# Patient Record
Sex: Male | Born: 1980 | Race: White | Hispanic: No | Marital: Single | Smoking: Current every day smoker
Health system: Southern US, Community
[De-identification: ages and names within clinical notes are randomized; demographics above are authoritative.]

## PROBLEM LIST (undated history)

## (undated) DIAGNOSIS — F1120 Opioid dependence, uncomplicated: Secondary | ICD-10-CM

## (undated) DIAGNOSIS — L409 Psoriasis, unspecified: Secondary | ICD-10-CM

## (undated) DIAGNOSIS — L0291 Cutaneous abscess, unspecified: Secondary | ICD-10-CM

## (undated) DIAGNOSIS — K219 Gastro-esophageal reflux disease without esophagitis: Secondary | ICD-10-CM

## (undated) HISTORY — DX: Psoriasis, unspecified: L40.9

---

## 2003-06-29 ENCOUNTER — Emergency Department (HOSPITAL_COMMUNITY): Admission: EM | Admit: 2003-06-29 | Discharge: 2003-06-29 | Payer: Self-pay | Admitting: Emergency Medicine

## 2003-07-07 ENCOUNTER — Emergency Department (HOSPITAL_COMMUNITY): Admission: EM | Admit: 2003-07-07 | Discharge: 2003-07-07 | Payer: Self-pay | Admitting: Emergency Medicine

## 2005-06-05 ENCOUNTER — Emergency Department (HOSPITAL_COMMUNITY): Admission: EM | Admit: 2005-06-05 | Discharge: 2005-06-06 | Payer: Self-pay | Admitting: Emergency Medicine

## 2006-05-29 ENCOUNTER — Emergency Department (HOSPITAL_COMMUNITY): Admission: EM | Admit: 2006-05-29 | Discharge: 2006-05-29 | Payer: Self-pay | Admitting: Emergency Medicine

## 2006-06-08 ENCOUNTER — Emergency Department (HOSPITAL_COMMUNITY): Admission: EM | Admit: 2006-06-08 | Discharge: 2006-06-08 | Payer: Self-pay | Admitting: Emergency Medicine

## 2007-01-10 ENCOUNTER — Emergency Department (HOSPITAL_COMMUNITY): Admission: EM | Admit: 2007-01-10 | Discharge: 2007-01-10 | Payer: Self-pay | Admitting: Emergency Medicine

## 2007-04-27 ENCOUNTER — Emergency Department (HOSPITAL_COMMUNITY): Admission: EM | Admit: 2007-04-27 | Discharge: 2007-04-27 | Payer: Self-pay | Admitting: Emergency Medicine

## 2007-08-28 ENCOUNTER — Emergency Department (HOSPITAL_COMMUNITY): Admission: EM | Admit: 2007-08-28 | Discharge: 2007-08-28 | Payer: Self-pay | Admitting: Emergency Medicine

## 2007-09-02 ENCOUNTER — Emergency Department (HOSPITAL_COMMUNITY): Admission: EM | Admit: 2007-09-02 | Discharge: 2007-09-02 | Payer: Self-pay | Admitting: Family Medicine

## 2008-05-23 ENCOUNTER — Emergency Department (HOSPITAL_COMMUNITY): Admission: EM | Admit: 2008-05-23 | Discharge: 2008-05-23 | Payer: Self-pay | Admitting: Emergency Medicine

## 2008-08-15 ENCOUNTER — Emergency Department (HOSPITAL_COMMUNITY): Admission: EM | Admit: 2008-08-15 | Discharge: 2008-08-15 | Payer: Self-pay | Admitting: Emergency Medicine

## 2010-09-15 LAB — URINALYSIS, ROUTINE W REFLEX MICROSCOPIC
Ketones, ur: NEGATIVE mg/dL
Nitrite: NEGATIVE
Protein, ur: NEGATIVE mg/dL

## 2010-09-15 LAB — URINE CULTURE: Culture: NO GROWTH

## 2011-01-06 ENCOUNTER — Emergency Department (HOSPITAL_COMMUNITY)
Admission: EM | Admit: 2011-01-06 | Discharge: 2011-01-06 | Disposition: A | Discharge status: Home / Self Care | Payer: Self-pay | Attending: Emergency Medicine | Admitting: Emergency Medicine

## 2011-01-06 DIAGNOSIS — M545 Low back pain, unspecified: Secondary | ICD-10-CM | POA: Insufficient documentation

## 2011-02-27 LAB — CULTURE, ROUTINE-ABSCESS

## 2011-03-10 ENCOUNTER — Inpatient Hospital Stay (INDEPENDENT_AMBULATORY_CARE_PROVIDER_SITE_OTHER)
Admission: RE | Admit: 2011-03-10 | Discharge: 2011-03-10 | Disposition: A | Discharge status: Home / Self Care | Payer: Self-pay | Source: Ambulatory Visit | Attending: Family Medicine | Admitting: Family Medicine

## 2011-03-10 ENCOUNTER — Emergency Department (HOSPITAL_COMMUNITY)
Admission: EM | Admit: 2011-03-10 | Discharge: 2011-03-10 | Discharge status: Against Medical Advice | Payer: Self-pay | Attending: Emergency Medicine | Admitting: Emergency Medicine

## 2011-03-10 DIAGNOSIS — R443 Hallucinations, unspecified: Secondary | ICD-10-CM

## 2011-03-10 DIAGNOSIS — F22 Delusional disorders: Secondary | ICD-10-CM

## 2011-03-14 LAB — WOUND CULTURE

## 2011-03-20 LAB — CBC
MCV: 90.7
RBC: 4.99
RDW: 13.4

## 2011-03-20 LAB — BASIC METABOLIC PANEL
BUN: 2 — ABNORMAL LOW
CO2: 27
GFR calc Af Amer: >60

## 2011-03-20 LAB — RAPID URINE DRUG SCREEN, HOSP PERFORMED
Barbiturates: NOT DETECTED
Tetrahydrocannabinol: POSITIVE — AB

## 2011-03-20 LAB — DIFFERENTIAL
Eosinophils Absolute: 0.3
Lymphocytes Relative: 21
Lymphs Abs: 3
Monocytes Absolute: 0.8 — ABNORMAL HIGH
Neutrophils Relative %: 71

## 2011-03-20 LAB — ETHANOL: Alcohol, Ethyl (B): <5

## 2011-04-12 ENCOUNTER — Encounter: Payer: Self-pay | Admitting: Internal Medicine

## 2012-04-29 ENCOUNTER — Emergency Department (HOSPITAL_COMMUNITY)
Admission: EM | Admit: 2012-04-29 | Discharge: 2012-04-29 | Disposition: A | Discharge status: Home / Self Care | Payer: Self-pay | Attending: Emergency Medicine | Admitting: Emergency Medicine

## 2012-04-29 ENCOUNTER — Emergency Department (HOSPITAL_COMMUNITY): Payer: Self-pay

## 2012-04-29 ENCOUNTER — Encounter (HOSPITAL_COMMUNITY): Payer: Self-pay | Admitting: Emergency Medicine

## 2012-04-29 DIAGNOSIS — F17200 Nicotine dependence, unspecified, uncomplicated: Secondary | ICD-10-CM | POA: Insufficient documentation

## 2012-04-29 DIAGNOSIS — Y929 Unspecified place or not applicable: Secondary | ICD-10-CM | POA: Insufficient documentation

## 2012-04-29 DIAGNOSIS — X58XXXA Exposure to other specified factors, initial encounter: Secondary | ICD-10-CM | POA: Insufficient documentation

## 2012-04-29 DIAGNOSIS — IMO0002 Reserved for concepts with insufficient information to code with codable children: Secondary | ICD-10-CM | POA: Insufficient documentation

## 2012-04-29 DIAGNOSIS — S6991XA Unspecified injury of right wrist, hand and finger(s), initial encounter: Secondary | ICD-10-CM

## 2012-04-29 DIAGNOSIS — Y9389 Activity, other specified: Secondary | ICD-10-CM | POA: Insufficient documentation

## 2012-04-29 MED ORDER — HYDROCODONE-ACETAMINOPHEN 5-325 MG PO TABS: 1.0000 | ORAL_TABLET | ORAL | Status: DC | PRN

## 2012-04-29 MED ORDER — TETANUS-DIPHTH-ACELL PERTUSSIS 5-2.5-18.5 LF-MCG/0.5 IM SUSP
0.5000 mL | Freq: Once | INTRAMUSCULAR | Status: AC
  Administered 2012-04-29: 0.5 mL via INTRAMUSCULAR
  Filled 2012-04-29: qty 0.5

## 2012-04-29 MED ORDER — HYDROCODONE-ACETAMINOPHEN 5-325 MG PO TABS
2.0000 | ORAL_TABLET | Freq: Once | ORAL | Status: AC
  Administered 2012-04-29: 2 via ORAL
  Filled 2012-04-29: qty 2

## 2012-04-29 MED ORDER — CEPHALEXIN 500 MG PO CAPS: 500.0000 mg | ORAL_CAPSULE | Freq: Four times a day (QID) | ORAL | Status: DC

## 2012-04-29 MED ORDER — IBUPROFEN 800 MG PO TABS: 800.0000 mg | ORAL_TABLET | Freq: Three times a day (TID) | ORAL | Status: DC

## 2012-04-29 MED ORDER — AMOXICILLIN-POT CLAVULANATE 875-125 MG PO TABS
1.0000 | ORAL_TABLET | Freq: Once | ORAL | Status: AC
  Administered 2012-04-29: 1 via ORAL
  Filled 2012-04-29: qty 1
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
## 2012-04-29 NOTE — ED Notes (Signed)
Ortho paged. 

## 2012-04-29 NOTE — Progress Notes (Signed)
Orthopedic Tech Progress Note Patient Details:  [name] Jan 06, 1981 [id number]  Ortho Devices Type of Ortho Device: Velcro wrist splint   [name] 04/29/2012, 6:10 AM
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
## 2012-06-04 ENCOUNTER — Encounter (HOSPITAL_COMMUNITY): Payer: Self-pay | Admitting: *Deleted

## 2012-06-04 ENCOUNTER — Emergency Department (HOSPITAL_COMMUNITY)
Admission: EM | Admit: 2012-06-04 | Discharge: 2012-06-04 | Disposition: A | Discharge status: Home / Self Care | Payer: Self-pay | Attending: Emergency Medicine | Admitting: Emergency Medicine

## 2012-06-04 DIAGNOSIS — F17200 Nicotine dependence, unspecified, uncomplicated: Secondary | ICD-10-CM | POA: Insufficient documentation

## 2012-06-04 DIAGNOSIS — L729 Follicular cyst of the skin and subcutaneous tissue, unspecified: Secondary | ICD-10-CM

## 2012-06-04 DIAGNOSIS — L723 Sebaceous cyst: Secondary | ICD-10-CM | POA: Insufficient documentation

## 2012-06-04 MED ORDER — SULFAMETHOXAZOLE-TRIMETHOPRIM 800-160 MG PO TABS: 2.0000 | ORAL_TABLET | Freq: Two times a day (BID) | ORAL | Status: DC

## 2012-06-04 MED ORDER — OXYCODONE-ACETAMINOPHEN 5-325 MG PO TABS: 1.0000 | ORAL_TABLET | Freq: Four times a day (QID) | ORAL | Status: DC | PRN

## 2012-06-04 NOTE — ED Notes (Signed)
Pt reports abscess to left forearm. Pt took razor blade to area, Broshears puss came out. Now pt reports area is just hard. Pain 10/10

## 2012-06-04 NOTE — ED Provider Notes (Signed)
History     CSN: [id number]  Arrival date & time 06/04/12  1711   First MD Initiated Contact with Patient 06/04/12 1805      Chief Complaint  Patient presents with  . Abscess    (Consider location/radiation/quality/duration/timing/severity/associated sxs/prior treatment) HPI Patient presents emergency Department with abscess and cellulitis to the left forearm near the elbow.  Patient states that he attempted to open the area with a razor blade 3 days ago.  He states that he drained some material, but seemed to reaccumulate and get worse over the last few days.  Patient denies IV drug use.  Patient denies fever, nausea, vomiting, dizziness, lethargy or weakness.  Patient states palpation makes the pain, worse History reviewed. No pertinent past medical history.  History reviewed. No pertinent past surgical history.  History reviewed. No pertinent family history.  History  Substance Use Topics  . Smoking status: Current Every Day Smoker -- 1.5 packs/day for 15 years  . Smokeless tobacco: Never Used  . Alcohol Use: Yes     Comment: occasionally      Review of Systems All other systems negative except as documented in the HPI. All pertinent positives and negatives as reviewed in the HPI.  Allergies  Review of patient's allergies indicates no known allergies.  Home Medications  No current outpatient prescriptions on file.  BP 151/73  Pulse 101  Temp 98.2 F (36.8 C)  Resp 16  SpO2 100%  Physical Exam  Constitutional: He is oriented to person, place, and time. He appears well-developed and well-nourished. No distress.  Pulmonary/Chest: Effort normal.  Musculoskeletal:       Arms: Neurological: He is alert and oriented to person, place, and time.  Skin: Skin is warm and dry.    ED Course  Procedures (including critical care time)   INCISION AND DRAINAGE Performed by: Carlyle Dolly Consent: Verbal consent obtained. Risks and benefits: risks, benefits  and alternatives were discussed Type: abscess  Body area: Proximal lateral forearm on the left  Anesthesia: local infiltration  Incision was made with a scalpel.  Local anesthetic: lidocaine 2% w epinephrine  Anesthetic total: 7 ml  Complexity: complex Blunt dissection to break up loculations  Drainage: Jellylike   Drainage amount: The area appears to be draining more thick jellylike material that is non-purulent.  Dr. Patria Mane also looked at the area and felt like this was a large cyst that had become secondarily infected.  There is a well-circumscribed area that is fairly large to the lateral proximal forearm that seems most consistent with a cyst like growth. Patient tolerance: Patient tolerated the procedure well with no immediate complications.     MDM          Carlyle Dolly, [name] 06/04/12 2349

## 2012-06-05 NOTE — ED Provider Notes (Signed)
Medical screening examination/treatment/procedure(s) were performed by non-physician practitioner and as supervising physician I was immediately available for consultation/collaboration.   Lyanne Co, MD 06/05/12 [phone]

## 2012-06-08 ENCOUNTER — Emergency Department (HOSPITAL_COMMUNITY)
Admission: EM | Admit: 2012-06-08 | Discharge: 2012-06-08 | Disposition: A | Discharge status: Home / Self Care | Payer: Self-pay | Attending: Emergency Medicine | Admitting: Emergency Medicine

## 2012-06-08 ENCOUNTER — Encounter (HOSPITAL_COMMUNITY): Payer: Self-pay | Admitting: Emergency Medicine

## 2012-06-08 ENCOUNTER — Other Ambulatory Visit: Payer: Self-pay | Admitting: Orthopedic Surgery

## 2012-06-08 DIAGNOSIS — IMO0001 Reserved for inherently not codable concepts without codable children: Secondary | ICD-10-CM | POA: Insufficient documentation

## 2012-06-08 DIAGNOSIS — L02414 Cutaneous abscess of left upper limb: Secondary | ICD-10-CM

## 2012-06-08 DIAGNOSIS — F17200 Nicotine dependence, unspecified, uncomplicated: Secondary | ICD-10-CM | POA: Insufficient documentation

## 2012-06-08 DIAGNOSIS — IMO0002 Reserved for concepts with insufficient information to code with codable children: Secondary | ICD-10-CM | POA: Insufficient documentation

## 2012-06-08 HISTORY — DX: Cutaneous abscess, unspecified: L02.91

## 2012-06-08 MED ORDER — OXYCODONE-ACETAMINOPHEN 5-500 MG PO TABS: 1.0000 | ORAL_TABLET | Freq: Four times a day (QID) | ORAL | Status: DC | PRN

## 2012-06-08 MED ORDER — OXYCODONE-ACETAMINOPHEN 5-325 MG PO TABS: 1.0000 | ORAL_TABLET | Freq: Four times a day (QID) | ORAL | Status: DC | PRN

## 2012-06-08 MED ORDER — ONDANSETRON 4 MG PO TBDP
8.0000 mg | ORAL_TABLET | Freq: Once | ORAL | Status: AC
  Administered 2012-06-08: 8 mg via ORAL
  Filled 2012-06-08: qty 2

## 2012-06-08 MED ORDER — HYDROMORPHONE HCL PF 2 MG/ML IJ SOLN
2.0000 mg | Freq: Once | INTRAMUSCULAR | Status: AC
  Administered 2012-06-08: 2 mg via INTRAMUSCULAR
  Filled 2012-06-08: qty 1

## 2012-06-08 MED ORDER — OXYCODONE-ACETAMINOPHEN 5-325 MG PO TABS: 1.0000 | ORAL_TABLET | ORAL | Status: AC | PRN

## 2012-06-08 NOTE — ED Provider Notes (Signed)
History     CSN: [id number]  Arrival date & time 06/08/12  0920   First MD Initiated Contact with Patient 06/08/12 [phone]      Chief Complaint  Patient presents with  . Abscess    (Consider location/radiation/quality/duration/timing/severity/associated sxs/prior treatment) HPI Comments: Patient presents with complaint of infection to left elbow. Patient was seen in emergency department on 12/31 and had the area drained. It was thought at that time to be a cyst. Patient was placed on Bactrim and Percocet. He states that he has been taking these. Patient denies fever. Pain is worse with palpation. He is fully able to move elbow. Patient states that he used to use IV drugs however he has not in the past several months, and he did not use that part of his arm. No nausea or vomiting. Onset gradual. Course is persistent. Nothing makes symptoms better or worse.  Patient is a 32 y.o. male presenting with abscess. The history is provided by the patient.  Abscess  Pertinent negatives include no fever and no vomiting.    Past Medical History  Diagnosis Date  . Abscess     History reviewed. No pertinent past surgical history.  No family history on file.  History  Substance Use Topics  . Smoking status: Current Every Day Smoker -- 1.5 packs/day for 15 years  . Smokeless tobacco: Never Used  . Alcohol Use: Yes     Comment: occasionally      Review of Systems  Constitutional: Negative for fever.  Gastrointestinal: Negative for nausea and vomiting.  Musculoskeletal: Positive for myalgias. Negative for arthralgias.  Skin: Negative for color change.       Positive for abscess  Hematological: Negative for adenopathy.    Allergies  Review of patient's allergies indicates no known allergies.  Home Medications   Current Outpatient Rx  Name  Route  Sig  Dispense  Refill  . OXYCODONE-ACETAMINOPHEN 5-325 MG PO TABS   Oral   Take 1 tablet by mouth every 6 (six) hours as needed for  pain.   15 tablet   0   . SULFAMETHOXAZOLE-TRIMETHOPRIM 800-160 MG PO TABS   Oral   Take 2 tablets by mouth 2 (two) times daily.   40 tablet   0     BP 106/94  Pulse 82  Temp 97.8 F (36.6 C) (Oral)  Resp 18  SpO2 95%  Physical Exam  Nursing note and vitals reviewed. Constitutional: He appears well-developed and well-nourished.  HENT:  Head: Normocephalic and atraumatic.  Eyes: Conjunctivae normal are normal.  Neck: Normal range of motion. Neck supple.  Cardiovascular: Normal pulses.   Musculoskeletal: He exhibits edema and tenderness.       Left elbow: He exhibits swelling. He exhibits normal range of motion and no effusion. tenderness found.       Arms:      Hard area of induration to the radial aspect of antecubital fossa. There is overlying redness. Area is very tender. Small amount of sanguinous drainage.  Neurological: He is alert. No sensory deficit.       Motor, sensation, and vascular distal to the injury is fully intact.   Skin: Skin is warm and dry.  Psychiatric: He has a normal mood and affect.    ED Course  Procedures (including critical care time)  Labs Reviewed - No data to display No results found.   1. Abscess of forearm, left     9:42 AM Patient seen and examined. Medications ordered. Will  await hand consult.   Vital signs reviewed and are as follows: Filed Vitals:   06/08/12 0929  BP: 106/94  Pulse: 82  Temp: 97.8 F (36.6 C)  Resp: 18     MDM  Dr. Amanda Pea has seen and discharged. He will follow-up in office at outpatient. Likely infected inclusion cyst.         Renne Crigler, PA 06/08/12 1130

## 2012-06-08 NOTE — Discharge Summary (Signed)
  Please see  DictatedHistory and Physical as patient was not admitted. [name],[name]

## 2012-06-08 NOTE — ED Notes (Addendum)
Pt c/o abscess to left elbow area onset about 5 days ago. Pt reports seen at [facility] long for same. Pt sent by Dr Amanda Pea for further eval. Pt reports Dr. Amanda Pea is to meet him here

## 2012-06-08 NOTE — Consult Note (Signed)
[name] NO.:  [id number]  MEDICAL RECORD NO.:  [id number]  LOCATION:  C29C                         FACILITY:  [facility]  PHYSICIAN:  Dionne Ano. [name], M.D.DATE OF BIRTH:  07-29-1980  DATE OF CONSULTATION: DATE OF DISCHARGE:  06/08/2012                                CONSULTATION   HISTORY OF PRESENT ILLNESS:  [name] is a 32 year old male.  I was asked to see him in regard to his upper extremity predicament.  He has a history of left lateral elbow predicament that has been tender and problematic form.  I have reviewed this with him in length and summarized this in details.  A week ago, he began having some swelling in the arm.  He subsequently was seen in the ER.  Prior to that time, he had lanced the area himself in hopes to rid him of this problem.  He did obtain some discharge from the area in question.  On questioning the patient, he did not notice a lump, mass, or obvious sebaceous or inclusion cyst formation prior to a week ago, but has had a sebaceous cyst removed from his neck.  At present time, he presents for my evaluation and treatment.  I have seen him acutely in the emergency room today.  He is on Bactrim.  He notes no fever, chills, nausea, or vomiting.  He is pleasant male.  ALLERGIES:  None.  MEDICINES:  Bactrim DS and oxycodone p.r.n. pain.  He and I have reviewed his past medical and surgical history.  SOCIAL HISTORY:  Reviewed as well and is documented in his chart.  PHYSICAL EXAMINATION:  GENERAL:  Pleasant male.  He is appropriate, alert and oriented x4, in no acute distress. VITAL SIGNS:  Stable. NECK AND BACK:  Nontender. CHEST:  Clear. ABDOMEN:  Nontender. EXTREMITIES: Lower extremity examination is benign.  Right upper extremity is neurovascularly intact.  Left upper extremity has two areas laterally just at the point distal to the origin of the ECRB that is a bit swollen.  There is no erythema.  There is no  cellulitic change or excessive tenderness, induration or warmth.  There is some hardness to the area.  The elbow joint itself is stable to ligamentous examination.  He has an intact motion, no evidence of obvious bony deformity.  He is neurovascularly intact in the left upper extremity with normal radial, median, and ulnar nerve function.  PROCEDURE:  We have gone ahead and performed a limited debridement, unroofed the eschar and then performed I and D.  This I and D of skin and subcutaneous tissue performed with curette, chisel tip knife blade. He tolerated this well.  There were no obvious purulent features.  I specifically was looking for sebaceous cyst versus inclusion cyst-type material and I did not get a lot out.  I feel this likely represents the fact that he is to decompress the majority of this himself and has a thick and swollen capsule versus the fact that this is all just walled itself off again.  At the present time, I did not see any deep infectious-type features.  IMPRESSION:  Cystic mass with localized cellulitis, improving, status  post I and D.  PLAN:  I have gone ahead and packed the wound.  I discussed him dressing changes.  We were going to have him begin home dressing changes with Dial soap and water washing followed by Neosporin application followed by gauze followed by Desma Paganini and I am going to have him return to see [place] in the office Wednesday.  We have initiated care today and we will follow the patient.  I have discussed with him issues and options.  I do feel that there is a high probability that he may reform a inclusion/epidermoid/sebaceous-type cyst and ultimately need this removed electively, but I would not do this at this moment.  I would allow his areas to heal, treat any supposed cellulitis that was previously present, although there is none today and follow him closely. Ultimately, if he needs a higher tear irrigation and debridement, we will move  accordingly with that avenue of care.     Dionne Ano. [name], M.D.     [facility]  D:  06/08/2012  T:  06/08/2012  Job:  [id number]

## 2012-06-09 NOTE — ED Provider Notes (Signed)
Medical screening examination/treatment/procedure(s) were performed by non-physician practitioner and as supervising physician I was immediately available for consultation/collaboration.  Derwood Kaplan, MD 06/09/12 1205

## 2014-04-27 ENCOUNTER — Emergency Department (HOSPITAL_COMMUNITY)
Admission: EM | Admit: 2014-04-27 | Discharge: 2014-04-29 | Disposition: A | Discharge status: Other Acute Inpatient Hospital | Payer: Self-pay | Attending: Emergency Medicine | Admitting: Emergency Medicine

## 2014-04-27 ENCOUNTER — Encounter (HOSPITAL_COMMUNITY): Payer: Self-pay | Admitting: *Deleted

## 2014-04-27 DIAGNOSIS — Z79899 Other long term (current) drug therapy: Secondary | ICD-10-CM | POA: Insufficient documentation

## 2014-04-27 DIAGNOSIS — F1910 Other psychoactive substance abuse, uncomplicated: Secondary | ICD-10-CM | POA: Diagnosis present

## 2014-04-27 DIAGNOSIS — F1310 Sedative, hypnotic or anxiolytic abuse, uncomplicated: Secondary | ICD-10-CM | POA: Insufficient documentation

## 2014-04-27 DIAGNOSIS — F1510 Other stimulant abuse, uncomplicated: Secondary | ICD-10-CM | POA: Insufficient documentation

## 2014-04-27 DIAGNOSIS — F22 Delusional disorders: Secondary | ICD-10-CM | POA: Insufficient documentation

## 2014-04-27 DIAGNOSIS — Z792 Long term (current) use of antibiotics: Secondary | ICD-10-CM | POA: Insufficient documentation

## 2014-04-27 DIAGNOSIS — Z720 Tobacco use: Secondary | ICD-10-CM | POA: Insufficient documentation

## 2014-04-27 DIAGNOSIS — Z872 Personal history of diseases of the skin and subcutaneous tissue: Secondary | ICD-10-CM | POA: Insufficient documentation

## 2014-04-27 DIAGNOSIS — F1210 Cannabis abuse, uncomplicated: Secondary | ICD-10-CM | POA: Insufficient documentation

## 2014-04-27 DIAGNOSIS — D72829 Elevated white blood cell count, unspecified: Secondary | ICD-10-CM | POA: Insufficient documentation

## 2014-04-27 HISTORY — DX: Opioid dependence, uncomplicated: F11.20

## 2014-04-27 LAB — CBC
HEMATOCRIT: 49.2 % (ref 39.0–52.0)
HEMOGLOBIN: 17.3 g/dL — AB (ref 13.0–17.0)
MCH: 31.9 pg (ref 26.0–34.0)
MCHC: 35.2 g/dL (ref 30.0–36.0)
MCV: 90.8 fL (ref 78.0–100.0)
Platelets: 259 10*3/uL (ref 150–400)
RBC: 5.42 MIL/uL (ref 4.22–5.81)
RDW: 13.9 % (ref 11.5–15.5)
WBC: 20.9 10*3/uL — ABNORMAL HIGH (ref 4.0–10.5)

## 2014-04-27 NOTE — ED Notes (Signed)
Pt unable to void at this time. 

## 2014-04-27 NOTE — ED Notes (Signed)
[facility].  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  IVC papers taken out by his father.  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  Pt reports taking methadone every morning.  Pt is calm and cooperative at this time.  Denies any pain

## 2014-04-28 ENCOUNTER — Encounter (HOSPITAL_COMMUNITY): Payer: Self-pay | Admitting: Registered Nurse

## 2014-04-28 ENCOUNTER — Emergency Department (HOSPITAL_COMMUNITY): Payer: Self-pay

## 2014-04-28 DIAGNOSIS — F29 Unspecified psychosis not due to a substance or known physiological condition: Secondary | ICD-10-CM

## 2014-04-28 DIAGNOSIS — F1910 Other psychoactive substance abuse, uncomplicated: Secondary | ICD-10-CM | POA: Diagnosis present

## 2014-04-28 DIAGNOSIS — F22 Delusional disorders: Secondary | ICD-10-CM | POA: Diagnosis present

## 2014-04-28 LAB — RAPID URINE DRUG SCREEN, HOSP PERFORMED
Amphetamines: POSITIVE — AB
Barbiturates: NOT DETECTED
Benzodiazepines: POSITIVE — AB
COCAINE: NOT DETECTED
OPIATES: NOT DETECTED
Tetrahydrocannabinol: POSITIVE — AB

## 2014-04-28 LAB — URINE MICROSCOPIC-ADD ON

## 2014-04-28 LAB — COMPREHENSIVE METABOLIC PANEL
ALBUMIN: 4.6 g/dL (ref 3.5–5.2)
ALT: 46 U/L (ref 0–53)
ANION GAP: 18 — AB (ref 5–15)
AST: 33 U/L (ref 0–37)
Alkaline Phosphatase: 122 U/L — ABNORMAL HIGH (ref 39–117)
BUN: 16 mg/dL (ref 6–23)
CALCIUM: 10.8 mg/dL — AB (ref 8.4–10.5)
CO2: 24 mEq/L (ref 19–32)
Chloride: 92 mEq/L — ABNORMAL LOW (ref 96–112)
Creatinine, Ser: 1.06 mg/dL (ref 0.50–1.35)
GFR calc non Af Amer: >90 mL/min (ref >90)
GLUCOSE: 133 mg/dL — AB (ref 70–99)
Potassium: 4.1 mEq/L (ref 3.7–5.3)
SODIUM: 134 meq/L — AB (ref 137–147)
TOTAL PROTEIN: 10.1 g/dL — AB (ref 6.0–8.3)
Total Bilirubin: 0.5 mg/dL (ref 0.3–1.2)

## 2014-04-28 LAB — URINALYSIS, ROUTINE W REFLEX MICROSCOPIC
GLUCOSE, UA: NEGATIVE mg/dL
Ketones, ur: 15 mg/dL — AB
Leukocytes, UA: NEGATIVE
Nitrite: NEGATIVE
PH: 5.5 (ref 5.0–8.0)
Protein, ur: 100 mg/dL — AB
SPECIFIC GRAVITY, URINE: 1.036 — AB (ref 1.005–1.030)
UROBILINOGEN UA: 1 mg/dL (ref 0.0–1.0)

## 2014-04-28 LAB — ACETAMINOPHEN LEVEL

## 2014-04-28 LAB — ETHANOL: Alcohol, Ethyl (B): <11 mg/dL (ref 0–11)

## 2014-04-28 LAB — SALICYLATE LEVEL

## 2014-04-28 MED ORDER — DICYCLOMINE HCL 20 MG PO TABS: 20.0000 mg | ORAL_TABLET | Freq: Four times a day (QID) | ORAL | Status: DC | PRN

## 2014-04-28 MED ORDER — ONDANSETRON HCL 4 MG PO TABS: 4.0000 mg | ORAL_TABLET | Freq: Three times a day (TID) | ORAL | Status: DC | PRN

## 2014-04-28 MED ORDER — METHOCARBAMOL 500 MG PO TABS: 500.0000 mg | ORAL_TABLET | Freq: Three times a day (TID) | ORAL | Status: DC | PRN

## 2014-04-28 MED ORDER — PERPHENAZINE 4 MG PO TABS
4.0000 mg | ORAL_TABLET | Freq: Two times a day (BID) | ORAL | Status: DC
  Administered 2014-04-28 – 2014-04-29 (×3): 4 mg via ORAL
  Filled 2014-04-28 (×5): qty 1

## 2014-04-28 MED ORDER — NICOTINE 21 MG/24HR TD PT24
21.0000 mg | MEDICATED_PATCH | Freq: Every day | TRANSDERMAL | Status: DC
  Administered 2014-04-28 – 2014-04-29 (×2): 21 mg via TRANSDERMAL
  Filled 2014-04-28 (×2): qty 1

## 2014-04-28 MED ORDER — HYDROXYZINE HCL 25 MG PO TABS: 25.0000 mg | ORAL_TABLET | Freq: Four times a day (QID) | ORAL | Status: DC | PRN

## 2014-04-28 MED ORDER — CLONIDINE HCL 0.1 MG PO TABS: 0.1000 mg | ORAL_TABLET | ORAL | Status: DC

## 2014-04-28 MED ORDER — CLONIDINE HCL 0.1 MG PO TABS: 0.1000 mg | ORAL_TABLET | Freq: Every day | ORAL | Status: DC

## 2014-04-28 MED ORDER — ACETAMINOPHEN 325 MG PO TABS: 650.0000 mg | ORAL_TABLET | ORAL | Status: DC | PRN

## 2014-04-28 MED ORDER — ONDANSETRON 4 MG PO TBDP: 4.0000 mg | ORAL_TABLET | Freq: Four times a day (QID) | ORAL | Status: DC | PRN

## 2014-04-28 MED ORDER — NAPROXEN 500 MG PO TABS: 500.0000 mg | ORAL_TABLET | Freq: Two times a day (BID) | ORAL | Status: DC | PRN

## 2014-04-28 MED ORDER — LORAZEPAM 1 MG PO TABS
1.0000 mg | ORAL_TABLET | Freq: Three times a day (TID) | ORAL | Status: DC | PRN
  Administered 2014-04-28: 1 mg via ORAL
  Filled 2014-04-28: qty 1

## 2014-04-28 MED ORDER — CLONIDINE HCL 0.1 MG PO TABS
0.1000 mg | ORAL_TABLET | Freq: Four times a day (QID) | ORAL | Status: DC
  Administered 2014-04-28 – 2014-04-29 (×5): 0.1 mg via ORAL
  Filled 2014-04-28 (×5): qty 1

## 2014-04-28 MED ORDER — LOPERAMIDE HCL 2 MG PO CAPS
2.0000 mg | ORAL_CAPSULE | ORAL | Status: DC | PRN
Start: 2014-04-28 — End: 2014-04-29
  Administered 2014-04-28: 2 mg via ORAL
  Filled 2014-04-28: qty 1

## 2014-04-28 NOTE — ED Notes (Addendum)
Call to EDP to address abnormal urine. Request to call back in 15 minutes. (Dr. Hyacinth MeekerMiller)

## 2014-04-28 NOTE — ED Notes (Signed)
Pt belongings bags (2) are behind the triage nursing station.
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
## 2014-04-28 NOTE — BH Assessment (Signed)
Inpt recommended. No [facility] beds available. Sent referrals to the following facilities that report potential bed availability. [facility]  [place]  [place]  [place]  [place]  Delani Kohli, [place] Triage Specialist 04/28/2014 4:07 AM
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
## 2014-04-28 NOTE — ED Notes (Addendum)
Patient continues to have loose associations and AVH. "I swear to you I hear these voices, its my mother in the lobby."  Requested an enema for "the blood they put in my rectum." Redirected to reality.  Compliant with scheduled medications and no prn's required.  Safety maintained.
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
## 2014-04-28 NOTE — ED Notes (Signed)
Patient transported to X-ray 

## 2014-04-28 NOTE — BH Assessment (Signed)
Relayed results of assessment to Donell SievertSpencer Simon, PA. Per Karleen HampshireSpencer, [place] pt meets inpt criteria. No [facility] beds, TTS to seek placement.   Informed Elpidio AnisShari Upstill, [name] of recommendations and she is in agreement.   Informed RN of plan.   Pt in rest room will inform later.   Clista BernhardtNancy Shatarra Wehling, [facility] Triage Specialist 04/28/2014 1:11 AM

## 2014-04-28 NOTE — BH Assessment (Signed)
Spoke with William AnisShari Upstill, [name] prior to assessing pt. Pt was brought in under IVC due to increasing paranoia. Labs pending but medically cleared per William BeamShari, [name].  Assessment completed.    [name], [facility] Triage Specialist 04/28/2014 1:00 AM

## 2014-04-28 NOTE — Progress Notes (Signed)
[facility] Cooperative with initial nursing assessment. Pt denied pain when assessed. However, he was delusional stated his mother was in the lobby he could hear hear her voice and then later stated that she was gone because he could no longer hear her voice. Took his medication as scheduled. Writer made attempt to talk to Dr. Hyacinth MeekerMiller in reference to pt's UTI diagnosis in terms of medication order but was unable to talk with him. Spoke to another doctor who said he will inform Dr. Hyacinth MeekerMiller of my call. No gestures of self injurious behavior to note at this time. Q 15 minutes checks maintained for safety without behavioral outburst to note at this time.

## 2014-04-28 NOTE — ED Notes (Signed)
[facility] to [facility].
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
## 2014-04-28 NOTE — ED Notes (Signed)
2 pt belonging bags placed in locker #28. 

## 2014-04-29 ENCOUNTER — Inpatient Hospital Stay (HOSPITAL_COMMUNITY)
Admission: AD | Admit: 2014-04-29 | Discharge: 2014-05-07 | DRG: 897 | Disposition: A | Discharge status: Home / Self Care | Payer: Federal, State, Local not specified - Other | Source: Intra-hospital | Attending: Psychiatry | Admitting: Psychiatry

## 2014-04-29 ENCOUNTER — Encounter (HOSPITAL_COMMUNITY): Payer: Self-pay | Admitting: *Deleted

## 2014-04-29 DIAGNOSIS — F17210 Nicotine dependence, cigarettes, uncomplicated: Secondary | ICD-10-CM | POA: Diagnosis present

## 2014-04-29 DIAGNOSIS — F29 Unspecified psychosis not due to a substance or known physiological condition: Secondary | ICD-10-CM | POA: Insufficient documentation

## 2014-04-29 DIAGNOSIS — F1123 Opioid dependence with withdrawal: Secondary | ICD-10-CM | POA: Diagnosis present

## 2014-04-29 DIAGNOSIS — F1220 Cannabis dependence, uncomplicated: Secondary | ICD-10-CM | POA: Diagnosis present

## 2014-04-29 DIAGNOSIS — F1320 Sedative, hypnotic or anxiolytic dependence, uncomplicated: Secondary | ICD-10-CM | POA: Diagnosis present

## 2014-04-29 DIAGNOSIS — F13259 Sedative, hypnotic or anxiolytic dependence with sedative, hypnotic or anxiolytic-induced psychotic disorder, unspecified: Secondary | ICD-10-CM | POA: Diagnosis present

## 2014-04-29 DIAGNOSIS — F12250 Cannabis dependence with psychotic disorder with delusions: Secondary | ICD-10-CM | POA: Diagnosis present

## 2014-04-29 DIAGNOSIS — F1590 Other stimulant use, unspecified, uncomplicated: Secondary | ICD-10-CM | POA: Diagnosis present

## 2014-04-29 DIAGNOSIS — F19251 Other psychoactive substance dependence with psychoactive substance-induced psychotic disorder with hallucinations: Secondary | ICD-10-CM

## 2014-04-29 DIAGNOSIS — F200 Paranoid schizophrenia: Secondary | ICD-10-CM

## 2014-04-29 DIAGNOSIS — F1120 Opioid dependence, uncomplicated: Secondary | ICD-10-CM | POA: Diagnosis present

## 2014-04-29 DIAGNOSIS — F22 Delusional disorders: Secondary | ICD-10-CM | POA: Diagnosis present

## 2014-04-29 DIAGNOSIS — F15250 Other stimulant dependence with stimulant-induced psychotic disorder with delusions: Secondary | ICD-10-CM | POA: Diagnosis present

## 2014-04-29 DIAGNOSIS — F19239 Other psychoactive substance dependence with withdrawal, unspecified: Secondary | ICD-10-CM | POA: Insufficient documentation

## 2014-04-29 LAB — URINE CULTURE
CULTURE: NO GROWTH
Colony Count: NO GROWTH

## 2014-04-29 MED ORDER — PERPHENAZINE 4 MG PO TABS
4.0000 mg | ORAL_TABLET | Freq: Two times a day (BID) | ORAL | Status: DC
  Administered 2014-04-29 – 2014-05-04 (×8): 4 mg via ORAL
  Filled 2014-04-29 (×14): qty 1

## 2014-04-29 MED ORDER — ZIPRASIDONE MESYLATE 20 MG IM SOLR
20.0000 mg | Freq: Once | INTRAMUSCULAR | Status: AC
  Administered 2014-04-29: 20 mg via INTRAMUSCULAR
  Filled 2014-04-29: qty 20

## 2014-04-29 MED ORDER — METHOCARBAMOL 500 MG PO TABS: 500.0000 mg | ORAL_TABLET | Freq: Three times a day (TID) | ORAL | Status: DC | PRN

## 2014-04-29 MED ORDER — CLONIDINE HCL 0.1 MG PO TABS
0.1000 mg | ORAL_TABLET | Freq: Four times a day (QID) | ORAL | Status: AC
  Administered 2014-04-29: 0.1 mg via ORAL
  Filled 2014-04-29 (×4): qty 1

## 2014-04-29 MED ORDER — DICYCLOMINE HCL 20 MG PO TABS: 20.0000 mg | ORAL_TABLET | Freq: Four times a day (QID) | ORAL | Status: AC | PRN

## 2014-04-29 MED ORDER — ALUM & MAG HYDROXIDE-SIMETH 200-200-20 MG/5ML PO SUSP: 30.0000 mL | ORAL | Status: DC | PRN

## 2014-04-29 MED ORDER — MAGNESIUM HYDROXIDE 400 MG/5ML PO SUSP: 30.0000 mL | Freq: Every day | ORAL | Status: DC | PRN

## 2014-04-29 MED ORDER — NAPROXEN 500 MG PO TABS
500.0000 mg | ORAL_TABLET | Freq: Two times a day (BID) | ORAL | Status: DC | PRN
Start: 2014-04-29 — End: 2014-04-30

## 2014-04-29 MED ORDER — CLONIDINE HCL 0.1 MG PO TABS
0.1000 mg | ORAL_TABLET | Freq: Every day | ORAL | Status: AC
  Filled 2014-04-29 (×2): qty 1

## 2014-04-29 MED ORDER — NICOTINE 21 MG/24HR TD PT24
21.0000 mg | MEDICATED_PATCH | Freq: Every day | TRANSDERMAL | Status: DC
  Administered 2014-04-30 – 2014-05-07 (×5): 21 mg via TRANSDERMAL
  Filled 2014-04-29 (×2): qty 1
  Filled 2014-04-29: qty 14
  Filled 2014-04-29 (×5): qty 1
  Filled 2014-04-29: qty 14
  Filled 2014-04-29 (×3): qty 1

## 2014-04-29 MED ORDER — CLONIDINE HCL 0.1 MG PO TABS
0.1000 mg | ORAL_TABLET | ORAL | Status: AC
  Administered 2014-04-30: 0.1 mg via ORAL
  Filled 2014-04-29 (×4): qty 1

## 2014-04-29 MED ORDER — HYDROXYZINE HCL 25 MG PO TABS
25.0000 mg | ORAL_TABLET | Freq: Four times a day (QID) | ORAL | Status: AC | PRN
  Administered 2014-04-30: 25 mg via ORAL
  Filled 2014-04-29 (×2): qty 1
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
## 2014-04-29 NOTE — Progress Notes (Signed)
Pt took his scheduled AM medications with increased verbal prompts. Stated "y'all don't know what y'all doing, I want my damn Methadone back". Pt encouraged to comply with current treatment regimen to promote safety with detox / withdrawals. Pt. reluctantly took his medications then. Pt threw his scheduled 1400 dose of Clonidine 0.1mg  at writer and med fell on the floor in his room. Pt's bed had to be moved from it's original place to find his medication; which he then agreed to take. [facility] pending [employer] pick up d/u IVC status. Pt in agreement with transfer order. Maintained on Q15 minutes checks for safety.

## 2014-04-29 NOTE — ED Provider Notes (Signed)
Patient has been agitated this morning, slamming the wall and furniture.  Patient was IVC'd yesterday.  He is having auditory hallucinations.  There is question of possible urinary tract infection, labs reviewed.  He has 3-6 Sanson blood cells, many bacteria.  Urine was sent for culture.  It is still pending.  At this time, we will hold on treatment until we have a clear cut bacteria to treat.  20 mg of Geodon ordered to help with his behavior  Olivia Mackielga M Amari Burnsworth, MD 04/29/14 [phone]

## 2014-04-29 NOTE — Progress Notes (Signed)
Pt alert, remains delusional / confused when assessed. [facility] as per order and pt was cooperative with procedure. Report given to ByngBeverly, Charity fundraiserN [facility]. Pt was Picked up by [employer] d/t IVC status. Denied pain, SI / HI at time of assessment prior to departure. VSS. No behavioral outburst to note at this time. All belongings from locker 36 given to pt and property sheet signed in agreement to items received.  Safety maintained on Q 15 minutes checks till d/c.

## 2014-04-29 NOTE — Progress Notes (Signed)
[facility].  Derrell Lollingoris Madilyn Cephas, MSW Social Worker [phone]

## 2014-04-29 NOTE — BH Assessment (Addendum)
[facility] by Assunta FoundShuvon Rankin, NP and Dr. Jannifer FranklinAkintayo. Room assignment 501-2. Nursing report # [phone].

## 2014-04-29 NOTE — Progress Notes (Signed)
The focus of this group is to help patients review their daily goal of treatment and discuss progress on daily workbooks. Pt attended the evening group session but was unable to respond on-topic to discussion prompts from the Writer. Pt instead spoke about how he was having a problem seeing demons and that he would figure out a way to take care of them on his own. Pt's only additional request from Nursing Staff this evening was to receive toothbrush/toothpaste, which were given to him following group. Pt's affect was flat.
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
## 2014-04-29 NOTE — ED Notes (Signed)
Patient agitated. Appears to be responding to internal stimuli. Banging on unit doors. Slamming furniture in his room. Dr and security notified.

## 2014-04-29 NOTE — ED Notes (Signed)
Patient alert. Appears confused. Inquired if Clinical research associatewriter or other staff asked him not to sleep.  Encouragement offered. Declines intervention for anxiety and/or sleep.   Q 15 safety checks continue.

## 2014-04-30 DIAGNOSIS — F200 Paranoid schizophrenia: Secondary | ICD-10-CM

## 2014-04-30 DIAGNOSIS — F1910 Other psychoactive substance abuse, uncomplicated: Secondary | ICD-10-CM

## 2014-04-30 DIAGNOSIS — F29 Unspecified psychosis not due to a substance or known physiological condition: Secondary | ICD-10-CM | POA: Insufficient documentation

## 2014-04-30 MED ORDER — METHOCARBAMOL 750 MG PO TABS
750.0000 mg | ORAL_TABLET | Freq: Three times a day (TID) | ORAL | Status: AC | PRN
  Filled 2014-04-30: qty 1

## 2014-04-30 MED ORDER — IBUPROFEN 600 MG PO TABS
600.0000 mg | ORAL_TABLET | Freq: Four times a day (QID) | ORAL | Status: DC | PRN
  Filled 2014-04-30: qty 1
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
## 2014-04-30 NOTE — [facility] Suicide Risk Assessment (Signed)
   Nursing information obtained from:  Patient Demographic factors:  Male, Caucasian, Unemployed Current Mental Status:  NA Loss Factors:  NA Historical Factors:  NA Risk Reduction Factors:  Living with another person, especially a relative Total Time spent with patient: 1 hour  CLINICAL FACTORS:   More than one psychiatric diagnosis Currently Psychotic Previous Psychiatric Diagnoses and Treatments  Psychiatric Specialty Exam: Physical Exam  ROS  Blood pressure 144/76, pulse 110, temperature 97.8 F (36.6 C), temperature source Oral, resp. rate 18.There is no height or weight on file to calculate BMI.  General Appearance: Bizarre, Disheveled and Guarded  Eye Contact::  Poor  Speech:  Slow  Volume:  Decreased  Mood:  Irritable  Affect:  Inappropriate  Thought Process:  Circumstantial, Disorganized, Irrelevant and Loose  Orientation:  Full (Time, Place, and Person)  Thought Content:  Delusions, Hallucinations: Auditory and Paranoid Ideation  Suicidal Thoughts:  Yes.  without intent/plan  Homicidal Thoughts:  Yes.  without intent/plan  Memory:  Immediate;   Poor Recent;   Poor Remote;   Poor  Judgement:  Impaired  Insight:  Lacking  Psychomotor Activity:  Decreased  Concentration:  Poor  Recall:  Poor  Fund of Knowledge:Poor  Language: Fair  Akathisia:  No  Handed:  Right  AIMS (if indicated):     Assets:  Housing  Sleep:  Number of Hours: 6.75   Musculoskeletal: Strength & Muscle Tone: within normal limits Gait & Station: normal Patient leans: N/A  COGNITIVE FEATURES THAT CONTRIBUTE TO RISK:  Closed-mindedness Loss of executive function Polarized thinking Thought constriction (tunnel vision)    SUICIDE RISK:   Moderate:  Frequent suicidal ideation with limited intensity, and duration, some specificity in terms of plans, no associated intent, good self-control, limited dysphoria/symptomatology, some risk factors present, and identifiable protective factors,  including available and accessible social support.  PLAN OF CARE:  I certify that inpatient services furnished can reasonably be expected to improve the patient's condition.  Laquentin Loudermilk T. 04/30/2014, 12:25 PM

## 2014-04-30 NOTE — Plan of Care (Signed)
Problem: Alteration in mood & ability to function due to Goal: STG-Patient will comply with prescribed medication regimen (Patient will comply with prescribed medication regimen)  Outcome: Progressing Client will take medications as prescribed, reporting any side effects. "I don't take that, I don't take the orange and Latimore pills" Medications reviewed by Clinical research associatewriter, client took medications.

## 2014-04-30 NOTE — Plan of Care (Signed)
Problem: Alteration in thought process Goal: LTG-Patient verbalizes understanding importance med regimen (Patient verbalizes understanding of importance of medication regimen and need to continue outpatient care.) Outcome: Not Progressing Patient is refusing medication. Encouragement and medication education provided to pt.

## 2014-04-30 NOTE — [facility] Group Notes (Signed)
[facility] Group Notes:  (Nursing/MHT/Case Management/Adjunct)  Date:  04/30/2014  Time:  0900  Type of Therapy:  Psychoeducational Skills  Participation Level:  Did Not Attend  Participation Quality:  Did not attend  Affect:  Did not attend  Cognitive:  Did not attend  Insight:  None  Engagement in Group:  Did not attend  Modes of Intervention:  Education and Support  Summary of Progress/Problems: Pt did not attend group and remained laying in bed glaring at the ceiling, refusing to participate.  Lendell CapriceGuthrie, Kaylina Cahue A 04/30/2014, 12:16 PM

## 2014-04-30 NOTE — Plan of Care (Signed)
Problem: Ineffective individual coping Goal: STG: Patient will remain free from self harm Outcome: Progressing Patient remains free from self harm. 15 minute checks completed per protocol for pt safety.

## 2014-04-30 NOTE — Progress Notes (Signed)
Patient ID: [name], male   DOB: 07/27/1980, 33 y.o.   MRN: [id number] D: Client in bed this evening, eyes focused up at ceiling, refused medications and BP. "No, this ain't the right pill.  A: Writer encouraged client to have BP taken, noted it was elevated explained to client that medication would help decrease it. Writer inquired about hearing voices or suicidal ideations. Staff will monitor q4315min for safety. R: Client took medication after reluctantly having BP taken. Client is safe on the unit.
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
## 2014-05-01 LAB — GLUCOSE, CAPILLARY: Glucose-Capillary: 110 mg/dL — ABNORMAL HIGH (ref 70–99)

## 2014-05-01 MED ORDER — OLANZAPINE 10 MG PO TBDP
10.0000 mg | ORAL_TABLET | Freq: Once | ORAL | Status: AC
  Administered 2014-05-01: 10 mg via ORAL
  Filled 2014-05-01 (×2): qty 1

## 2014-05-01 NOTE — Progress Notes (Signed)
Patient ID: [name], male   DOB: August 21, 1980, 33 y.o.   MRN: [id number] D: Client lying still in bed looking towards the ceiling, refusing food. A: Writer in to assess, ability to follow directions encouraging client to get up and give a urine sample. Client encouraged to interact with staff about current stillness and quietness. CBG taken to assess for hypoglycemia, as client had not eaten this shift and dinner still at bedside. R: Client refuses saying "I'm going to knock you to the gates of hell" "I'm un conjuring something" client finally gets up to the BR but unable to process what he is suppose to be doing with the cup. Client was internally preoccupied, talking to voices "[name]" "I'm not going to do it" "Why did you do that to me, bitch" Writer called [name]. oncall PA received order for antipsychotic Zydis 10mg , because client had refused several Trilafon during the twenty-four hour period and has not sleep, because he was delusional.
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
## 2014-05-01 NOTE — Progress Notes (Signed)
Patient refused 1700 medication. Pt states "I don't need meds, I'm not crazy, I'm demonized. I got demons in me, now leave me alone, go on."

## 2014-05-01 NOTE — Progress Notes (Signed)
Psychoeducational Group Note  Date:  05/01/2014 Time: 2035  Group Topic/Focus:  Wrap-Up Group:   The focus of this group is to help patients review their daily goal of treatment and discuss progress on daily workbooks.  Participation Level: Did Not Attend  Participation Quality:  Not Applicable  Affect:  Not Applicable  Cognitive:  Not Applicable  Insight:  Not Applicable  Engagement in Group: Not Applicable  Additional Comments:  The patient did not attend group this evening.   Hazle CocaGOODMAN, Zakaria Sedor S 05/01/2014, 8:36 PM

## 2014-05-01 NOTE — Plan of Care (Signed)
Problem: Ineffective individual coping Goal: STG: Patient will remain free from self harm Outcome: Progressing Client remain safe on the unit with q7715min checks, agreeing to take scheduled medications and reporting side effects.

## 2014-05-01 NOTE — Clinical Social Work Note (Signed)
Unable to conduct PSA due to extreme paranoia.

## 2014-05-01 NOTE — Plan of Care (Signed)
Problem: Alteration in mood & ability to function due to Goal: LTG-Pt verbalizes understanding of importance of med regimen (Patient verbalizes understanding of importance of medication regimen and need to continue outpatient care and support groups)  Outcome: Not Progressing Patient is not able to verbalize the importance of his medication regimen but is receiving medication education via RN and takes his medications with encouragement. Goal: STG-Patient will report withdrawal symptoms Outcome: Not Progressing Patient denies all withdrawal symptoms but appears to be experiencing some including increased BP at times, agitation/anxiety, and has visible sweat on face at times.  Problem: Alteration in thought process Goal: LTG-Patient behavior demonstrates decreased signs psychosis (Patient behavior demonstrates decreased signs of psychosis to the point the patient is safe to return home and continue treatment in an outpatient setting.)  Outcome: Not Progressing Goal: STG-Patient is able to sleep at least 6 hours per night Outcome: Not Progressing Patient did not sleep much last night, per night shift nurse, pt slept about 1 hour.
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
## 2014-05-01 NOTE — Progress Notes (Signed)
D: Patient is alert and oriented. Pt's mood and affect is irritable, angry, and verbally aggressive. Pt speech is pressured and pt has forced laugh with smile incongruent with circumstances. Pt eye contact is glaring and pensive and pt is preoccupied, blocking, and paranoid. Pt reports depression 10/10 and hopelessness and anxiety 0/10. Pt refused clonidine this am and reports he doesn't take "the Balis pill and the orange pill together." Pt denies SI/HI and AVH but remains in bed glaring at ceiling/walls the majority of the day. Pt states to RN "Do you think I'm stupid?" Pt has notable body odor. Pt took Trilafon dose with encouragement and threw water cup aggressively after administration. Pt complains of back pain 8/10 but refuses PRN medications for pain. A: Hygiene supplies given. Support/Ecouragement provided to pt. Medication education reviewed with pt. PRN medications for pain offered. Pt encouraged to get out of bed to reduce back pain. Pressure ulcer prevention education reviewed with pt. Scheduled medications administered per providers orders (See MAR). 15 minute checks completed per protocol for pt safety. R: Pt not receptive to nursing interventions.
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
## 2014-05-01 NOTE — [facility] Group Notes (Signed)
[facility]   05/01/2014 10:15 AM  Participation Quality:  Invited.  Chose to not attend    [name], Thereasa Distanceodney B

## 2014-05-02 MED ORDER — HYDROXYZINE HCL 25 MG PO TABS
ORAL_TABLET | ORAL | Status: AC
  Filled 2014-05-02: qty 2

## 2014-05-02 MED ORDER — HYDROXYZINE HCL 50 MG PO TABS
50.0000 mg | ORAL_TABLET | Freq: Every evening | ORAL | Status: DC | PRN
  Administered 2014-05-02: 50 mg via ORAL

## 2014-05-02 NOTE — Progress Notes (Signed)
Adult Psychoeducational Group Note  Date:  05/02/2014 Time:  9:44 PM  Group Topic/Focus:  Wrap-Up Group:   The focus of this group is to help patients review their daily goal of treatment and discuss progress on daily workbooks.  Participation Level:  Minimal  Participation Quality:  Appropriate  Affect:  Flat  Cognitive:  Lacking  Insight: Limited  Engagement in Group:  Limited  Modes of Intervention:  Socialization and Support  Additional Comments:  Patient attended and participated in group tonight. He reports having a great day in comparison to the other days. He felt happier today. He went for his meals, attended his group and went outside with the group to play basketball. He advised that today he wanted to start over with everyone. He spent more time out of his room socializing with others.  Lita MainsFrancis, Thom Ollinger [facility] 05/02/2014, 9:44 PM

## 2014-05-02 NOTE — Progress Notes (Signed)
Writer spoke with patient 1:1 and he reports having had a good day today and it was good to get outside today. He requested his night medications but writer informed him that it was too early and the fact that he had nothing scheduled for night. Writer inquired about his sleep and he reports sleeping a couple of hours. Patient was informed that NP on call would be notified and made aware of his request. Patient apologized for his behavior when he was first admitted and reported that he appreciates everything staff have been doing to help him. He denies si/hi/a/v hallucinations. Safety maintained on unit with 15 min checks.

## 2014-05-02 NOTE — Progress Notes (Signed)
Patient ID: [name], male   DOB: 1981/04/16, 33 y.o.   MRN: [id number] D. Patient presents with irritable, suspicious mood, affect congruent. Patient has been isolative to his room throughout most of shift. He is noted to be responding to internal stimuli, with disheveled appearance. When writer approached patient was very paranoid and guarded stating '' what are you doing in here trying to talk to me for? I know everyone is playing mind games, I shouldn't be here in this asylum, they are trying to poison me. You are a witch. I can tell you are trying to use that reverse psychology on me. '' Patient has been refusing his medications, and easily agitated. He is refusing meals as well. Pt is unable to tolerate a room mate at this time. A. Medications given as ordered, and patient did later accept medications when MD explained medications would be forced. Discussed patient progress with Dr. Lolly MustacheArfeen. R. Patient is safe. In no acute distress at this time. Will continue to monitor q 15 minutes for safety.
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
## 2014-05-02 NOTE — Plan of Care (Signed)
Problem: Alteration in mood & ability to function due to Goal: STG-Patient will comply with prescribed medication regimen (Patient will comply with prescribed medication regimen)  Outcome: Progressing Pt refused multiple medications on 05/01/14.

## 2014-05-02 NOTE — [facility] Group Notes (Signed)
[facility] Group Notes:  (Nursing/MHT/Case Management/Adjunct)  Date:  05/02/2014  Time:  12:26 PM  Type of Therapy:  Psychoeducational Skills  Participation Level:  Active  Participation Quality:  Appropriate  Affect:  Appropriate  Cognitive:  Appropriate  Insight:  Appropriate  Engagement in Group:  Engaged  Modes of Intervention:  Problem-solving  Summary of Progress/Problems: Pt attended healthy coping skills group.   Jacquelyne BalintForrest, Kailene Steinhart Shanta 05/02/2014, 12:26 PM

## 2014-05-02 NOTE — Progress Notes (Signed)
Patient ID: [name], male   DOB: 30-Nov-1980, 33 y.o.   MRN: [id number] Consulted with MHTs on [address] who report patient currently experiencing psychosis. Will attempt to meet with patient and complete PSA later in the day as patient did agree this morning to take one of the medications prescribed. Carney Bernatherine C Jessenia Filippone, LCSW

## 2014-05-02 NOTE — [facility] Group Notes (Addendum)
[facility] Group Notes:  (Nursing/MHT/Case Management/Adjunct)  Date:  05/02/2014  Time:  11:26 AM  Type of Therapy:  Psychoeducational Skills  Participation Level:  Active  Participation Quality:  Appropriate  Affect:  flat  Cognitive:  Appropriate  Insight:  limited  Engagement in Group:  guarded  Modes of Intervention:  Problem-solving  Summary of Progress/Problems: Pt attended patient self inventory group. Jacquelyne BalintForrest, Shalita Shanta 05/02/2014, 11:26 AM

## 2014-05-02 NOTE — [facility] Group Notes (Signed)
   [facility] LCSW Group Therapy Note  05/02/2014 11:15   Type of Therapy and Topic:  Group Therapy: Avoiding Self-Sabotaging and Enabling Behaviors  Participation Level:  Minimal  Description of Group:     Learn how to identify obstacles, self-sabotaging and enabling behaviors, what are they, why do we do them and what needs do these behaviors meet? Discuss unhealthy relationships and how to have positive healthy boundaries with those that sabotage and enable. Explore aspects of self-sabotage and enabling in yourself and how to limit these self-destructive behaviors in everyday life. A scaling question is used to help patient look at where they are now in their motivation to change, from 1 to 10 (lowest to highest motivation).  Therapeutic Goals: 1. Patient will identify one obstacle that relates to self-sabotage and enabling behaviors 2. Patient will identify one personal self-sabotaging or enabling behavior they did prior to admission 3. Patient able to establish a plan to change the above identified behavior they did prior to admission:  4. Patient will demonstrate ability to communicate their needs through discussion and/or role plays.   Summary of Patient Progress: The main focus of today's process group was to discuss what "self-sabotage" means and use Motivational Interviewing to discuss what benefits, negative or positive, were involved in a self-identified self-sabotaging behavior. We then talked about reasons the patient may want to change the behavior and her current desire to change. A scaling question was used to help patient look at where they are now in motivation for change, from 1 to 10 (lowest to highest motivation).  Maisie Fushomas arrived at midpoint of group and appeared to be attentive once group process was explained to him. Patient shared that his life has been "a repeat for a while now." Patient described negative cycle of isolation, noncompliance with medication, substance use  followed by negative consequences. Maisie Fushomas shared that he is 'very invested in change' although he was unable to identify self sabotaging behaviors he wants to change nor rate his motivation.    Therapeutic Modalities:   Cognitive Behavioral Therapy Person-Centered Therapy Motivational Interviewing   Carney Bernatherine C Aayan Haskew, LCSW

## 2014-05-03 MED ORDER — TRAZODONE HCL 50 MG PO TABS
50.0000 mg | ORAL_TABLET | Freq: Every evening | ORAL | Status: DC | PRN
  Filled 2014-05-03 (×7): qty 1

## 2014-05-03 NOTE — [facility] Group Notes (Signed)
[facility] Group Notes:  (Nursing/MHT/Case Management/Adjunct)  Date:  05/03/2014  Time:  9:13 AM  Type of Therapy: Psychoeducational Skills  Participation Level: Active  Participation Quality: Appropriate  Affect: Appropriate  Cognitive: Appropriate  Insight: Appropriate  Engagement in Group: Engaged  Modes of Intervention: Problem-solving  Summary of Progress/Problems: Pt attended patient self inventory group.  Diavian Furgason Shanta 05/03/2014, 9:13 AM 

## 2014-05-03 NOTE — [facility] Group Notes (Signed)
[facility] Group Notes:  (Nursing/MHT/Case Management/Adjunct)  Date:  05/03/2014  Time:  10:09 AM  Type of Therapy: Psychoeducational Skills  Participation Level: Active  Participation Quality: Appropriate  Affect: Appropriate  Cognitive: Appropriate  Insight: Appropriate  Engagement in Group: Engaged  Modes of Intervention: Problem-solving  Summary of Progress/Problems: Pt attended healthy coping skills group.  [name] 05/03/2014, 10:09 AM 

## 2014-05-03 NOTE — Plan of Care (Signed)
Problem: Ineffective individual coping Goal: STG: Patient will remain free from self harm Outcome: Progressing Patient has remained free from harm.     

## 2014-05-03 NOTE — Progress Notes (Signed)
Patient ID: [name], male   DOB: [birth year], 33 y.o.   MRN: [id number] D. Pt presents with depressed mood, affect blunted. [name] continues to show improvement as he has been less isolative on the unit,and more interactive with staff and peers. He remains somewhat disorganized in his thoughts, but he denies any AH/VH today. He has been compliant with medications. [name] did complete his self inventory today and rates his depression and anxiety at 5/10 on scale, 10 being worst depression 1 being least. He did report very poor sleep last night. A. Medications given as ordered. Support and encouragement provided . Discussed above information with Dr. Lolly MustacheArfeen. R. Patient is safe. Will continue to monitor q 15 minutes for safety.

## 2014-05-03 NOTE — [facility] Group Notes (Signed)
[facility] LCSW Group Therapy  05/03/2014   11:00 AM   Type of Therapy:  Group Therapy  Participation Level:  Minimal  Participation Quality:  Appropriate and Attentive  Affect:  Appropriate, Flat and guarded  Cognitive:  Alert and Appropriate  Insight:  Developing/Improving and Engaged  Engagement in Therapy:  Developing/Improving and Engaged  Modes of Intervention:  Clarification, Confrontation, Discussion, Education, Exploration, Limit-setting, Orientation, Problem-solving, Rapport Building, Dance movement psychotherapisteality Testing, Socialization and Support  Summary of Progress/Problems: The main focus of today's process group was to identify the patient's current support system and decide on other supports that can be put in place.  An emphasis was placed on using counselor, doctor, therapy groups, 12-step groups, and problem-specific support groups to expand supports, as well as doing something different than has been done before.  Pt was quiet and minimally participated in group.  Pt was guarded when called upon but did share that he has been trying to come out of his room more and has enjoyed his time here so far.     William IvanChelsea Horton, LCSW 05/03/2014 12:03 PM

## 2014-05-03 NOTE — Progress Notes (Signed)
[facility]  05/03/2014 10:24 AM [name]  MRN:  [id number]   Subjective:  I am feeling better.   Objective Patient seen and chart reviewed.   Patient is taking his medication and feeling better.  He still have insomnia .  Patient is still very upset on his father and easily gets irritable paranoid and guarded about him.  He sleeping 3-4 hours .  He denies any side effects of medication.  Though he denies that he has any psychiatric illness but he is taking his medication as prescribed.  Patient continues to feel that his father is responsible for his admission but he was seen more pleasant in the groups.  He denies any hallucination or any paranoia but he continues to have vague homicidal thoughts towards his father.  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 Abscess   . Heroin addiction     ADL's:  Impaired  Sleep: Poor  Appetite:  Fair  Suicidal Ideation:  Plan:  Denies Intent:  Denies Means:  Denies Homicidal Ideation:  Plan:  Wants to hurt his father Intent:  Denies Means:  Denies AEB (as evidenced by):  Psychiatric Specialty Exam: Physical Exam  ROS  Blood pressure 121/95, pulse 117, temperature 98.4 F (36.9 C), temperature source Oral, resp. rate 20, SpO2 99 %.There is no height or weight on file to calculate BMI.  General Appearance: Disheveled and Guarded  Eye Contact::  Poor  Speech:  Slow  Volume:  Decreased  Mood:  Depressed, Dysphoric, Hopeless and Irritable  Affect:  Constricted and Depressed  Thought Process:  Circumstantial, Disorganized and Loose  Orientation:  Full (Time, Place, and Person)  Thought Content:  Paranoid Ideation  Suicidal Thoughts:  No  Homicidal Thoughts:   Yes.  without intent/plan  Memory:  Immediate;   Poor Recent;   Poor Remote;   Poor  Judgement:  Impaired  Insight:  Lacking  Psychomotor Activity:  Decreased  Concentration:  Fair  Recall:  [employer]  Fund of Knowledge:Poor  Language: Poor  Akathisia:  No  Handed:  Right  AIMS (if indicated):     Assets:  Housing  Sleep:  Number of Hours: 4.25   Musculoskeletal: Strength & Muscle Tone: within normal limits Gait & Station: normal Patient leans: N/A  Current Medications: Current Facility-Administered Medications  Medication Dose Route Frequency Provider Last Rate Last Dose  . alum & mag hydroxide-simeth (MAALOX/MYLANTA) 200-200-20 MG/5ML suspension 30 mL  30 mL Oral Q4H PRN Shuvon Rankin, NP      . cloNIDine (CATAPRES) tablet 0.1 mg  0.1 mg Oral QAC breakfast Shuvon Rankin, NP   0.1 mg at 05/03/14 [id number]  . dicyclomine (BENTYL) tablet 20 mg  20 mg Oral Q6H PRN Shuvon Rankin, NP      . hydrOXYzine (ATARAX/VISTARIL) tablet 25 mg  25 mg Oral Q6H PRN Shuvon Rankin, NP   25 mg at 04/30/14 0311  . hydrOXYzine (ATARAX/VISTARIL) tablet 50 mg  50 mg Oral QHS PRN Kristeen MansFran E Hobson, NP   50 mg at 05/02/14 2120  . ibuprofen (ADVIL,MOTRIN) tablet 600 mg  600 mg Oral Q6H PRN Lindwood QuaSheila May Agustin, NP      . magnesium hydroxide (MILK OF MAGNESIA) suspension 30 mL  30 mL  Oral Daily PRN Shuvon Rankin, NP      . methocarbamol (ROBAXIN) tablet 750 mg  750 mg Oral Q8H PRN Lindwood QuaSheila May Agustin, NP      . nicotine (NICODERM CQ - dosed in mg/24 hours) patch 21 mg  21 mg Transdermal Daily Shuvon Rankin, NP   21 mg at 05/03/14 0754  . perphenazine (TRILAFON) tablet 4 mg  4 mg Oral BID Shuvon Rankin, NP   4 mg at 05/03/14 [id number]    Lab Results:  No results found for this or any previous visit (from the past 48 hour(s)).  Physical Findings: AIMS: Facial and Oral Movements Muscles of Facial Expression: None, normal Lips and Perioral Area: None, normal Jaw: None, normal Tongue: None, normal,Extremity Movements Upper  (arms, wrists, hands, fingers): None, normal Lower (legs, knees, ankles, toes): None, normal, Trunk Movements Neck, shoulders, hips: None, normal, Overall Severity Severity of abnormal movements (highest score from questions above): None, normal Incapacitation due to abnormal movements: None, normal Patient's awareness of abnormal movements (rate only patient's report): No Awareness,    CIWA:    COWS:  COWS Total Score: 0  Treatment Plan Summary: Patient is showing some improvement from the past.  Continue Trilafon as prescribed.  Patient still have paranoia and homicidal thoughts towards his father.  Encouraged to participate in the groups.  Increase collateral from his father.  Patient will require appropriate and safe discharge planning.  He lives with his father .   Medical Decision Making Problem Points:  Review of last therapy session (1) and Review of psycho-social stressors (1) Data Points:  Decision to obtain old records (1) Review of medication regiment & side effects (2) Review of new medications or change in dosage (2)  I certify that inpatient services furnished can reasonably be expected to improve the patient's condition.   Chevella Pearce T. 05/03/2014, 10:24 AM

## 2014-05-03 NOTE — Progress Notes (Signed)
Adult Psychoeducational Group Note  Date:  05/03/2014 Time:  8:46 PM  Group Topic/Focus:  Wrap-Up Group:   The focus of this group is to help patients review their daily goal of treatment and discuss progress on daily workbooks.  Participation Level:  Active  Participation Quality:  Appropriate and Drowsy  Affect:  Flat  Cognitive:  Appropriate  Insight: Good  Engagement in Group:  Engaged  Modes of Intervention:  Support  Additional Comments:  Pt stated his goal for today was to interact in every group. Pt stated he did attend groups and that the staff has been very helpful. Pt stated his support system are "true friends."  Caswell CorwinOwen, Anaiz Qazi C 05/03/2014, 8:46 PM

## 2014-05-04 DIAGNOSIS — F1123 Opioid dependence with withdrawal: Secondary | ICD-10-CM

## 2014-05-04 DIAGNOSIS — F1590 Other stimulant use, unspecified, uncomplicated: Secondary | ICD-10-CM | POA: Diagnosis present

## 2014-05-04 DIAGNOSIS — F19251 Other psychoactive substance dependence with psychoactive substance-induced psychotic disorder with hallucinations: Secondary | ICD-10-CM

## 2014-05-04 DIAGNOSIS — F1990 Other psychoactive substance use, unspecified, uncomplicated: Secondary | ICD-10-CM

## 2014-05-04 DIAGNOSIS — F1290 Cannabis use, unspecified, uncomplicated: Secondary | ICD-10-CM

## 2014-05-04 DIAGNOSIS — F1599 Other stimulant use, unspecified with unspecified stimulant-induced disorder: Secondary | ICD-10-CM

## 2014-05-04 DIAGNOSIS — F1320 Sedative, hypnotic or anxiolytic dependence, uncomplicated: Secondary | ICD-10-CM | POA: Diagnosis present

## 2014-05-04 DIAGNOSIS — F1220 Cannabis dependence, uncomplicated: Secondary | ICD-10-CM | POA: Diagnosis present

## 2014-05-04 DIAGNOSIS — F1120 Opioid dependence, uncomplicated: Secondary | ICD-10-CM | POA: Diagnosis present

## 2014-05-04 MED ORDER — CLONIDINE HCL 0.1 MG PO TABS: 0.1000 mg | ORAL_TABLET | Freq: Every day | ORAL | Status: DC

## 2014-05-04 MED ORDER — HYDROXYZINE HCL 25 MG PO TABS
25.0000 mg | ORAL_TABLET | Freq: Four times a day (QID) | ORAL | Status: DC | PRN
  Filled 2014-05-04: qty 20

## 2014-05-04 MED ORDER — HALOPERIDOL 5 MG PO TABS
5.0000 mg | ORAL_TABLET | Freq: Two times a day (BID) | ORAL | Status: DC
  Administered 2014-05-04 – 2014-05-05 (×2): 5 mg via ORAL
  Filled 2014-05-04 (×4): qty 1

## 2014-05-04 MED ORDER — CLONIDINE HCL 0.1 MG PO TABS
0.1000 mg | ORAL_TABLET | Freq: Four times a day (QID) | ORAL | Status: DC
  Administered 2014-05-05: 0.1 mg via ORAL
  Filled 2014-05-04 (×7): qty 1

## 2014-05-04 MED ORDER — NAPROXEN 500 MG PO TABS: 500.0000 mg | ORAL_TABLET | Freq: Two times a day (BID) | ORAL | Status: DC | PRN

## 2014-05-04 MED ORDER — CLONAZEPAM 0.5 MG PO TABS
0.5000 mg | ORAL_TABLET | Freq: Two times a day (BID) | ORAL | Status: DC
  Administered 2014-05-04 – 2014-05-05 (×2): 0.5 mg via ORAL
  Filled 2014-05-04 (×2): qty 1

## 2014-05-04 MED ORDER — BENZTROPINE MESYLATE 0.5 MG PO TABS
0.5000 mg | ORAL_TABLET | Freq: Two times a day (BID) | ORAL | Status: DC
  Administered 2014-05-04 – 2014-05-07 (×6): 0.5 mg via ORAL
  Filled 2014-05-04: qty 1
  Filled 2014-05-04: qty 28
  Filled 2014-05-04: qty 1
  Filled 2014-05-04: qty 28
  Filled 2014-05-04 (×3): qty 1
  Filled 2014-05-04: qty 28
  Filled 2014-05-04: qty 1
  Filled 2014-05-04: qty 28
  Filled 2014-05-04 (×2): qty 1

## 2014-05-04 MED ORDER — DICYCLOMINE HCL 20 MG PO TABS: 20.0000 mg | ORAL_TABLET | Freq: Four times a day (QID) | ORAL | Status: DC | PRN

## 2014-05-04 MED ORDER — CLONIDINE HCL 0.1 MG PO TABS
0.1000 mg | ORAL_TABLET | ORAL | Status: DC
  Filled 2014-05-04: qty 1

## 2014-05-04 MED ORDER — ONDANSETRON 4 MG PO TBDP: 4.0000 mg | ORAL_TABLET | Freq: Four times a day (QID) | ORAL | Status: DC | PRN

## 2014-05-04 MED ORDER — LOPERAMIDE HCL 2 MG PO CAPS: 2.0000 mg | ORAL_CAPSULE | ORAL | Status: DC | PRN

## 2014-05-04 MED ORDER — METHOCARBAMOL 500 MG PO TABS
500.0000 mg | ORAL_TABLET | Freq: Three times a day (TID) | ORAL | Status: DC | PRN
  Filled 2014-05-04: qty 20
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
## 2014-05-04 NOTE — [facility] Group Notes (Signed)
[facility]   05/04/2014 10:07 AM  Participation Quality:  Minimal  Mood/Affect:  Flat  Depression Rating:  Denies   Anxiety Rating:  Denies  Thoughts of Suicide:  No Will you contract for safety?   NA  Current AVH:  Denies  Plan for Discharge/Comments:  This was his first group with me.  Denies all symptoms.  States he feels ready to go.  Encouraged him to talk to Dr about d/c plan. States he will return home, follow up wherever we send him.  Transportation Means: unk  Supports: family  [name], [name]

## 2014-05-04 NOTE — [facility] Group Notes (Signed)
[facility] Group Notes:  (Counselor/Nursing/MHT/Case Management/Adjunct)  05/04/2014 1:15PM  Type of Therapy:  Group Therapy  Participation Level:  Active  Participation Quality:  Appropriate  Affect:  Flat  Cognitive:  Oriented  Insight:  Improving  Engagement in Group:  Limited  Engagement in Therapy:  Limited  Modes of Intervention:  Discussion, Exploration and Socialization  Summary of Progress/Problems: The topic for group was balance in life.  Pt participated in the discussion about when their life was in balance and out of balance and how this feels.  Pt discussed ways to get back in balance and short term goals they can work on to get where they want to be. Admitted to paranoia, but did not call it such.  "People talk about me behind my back.  As a result, I can't trust anyone."  Admits it happens here as well.  "I don't know why it happens.  I am doing everything I am supposed to."  Later stated that despite his paranoia, he is able to count on a few others.  Declined to name who they are.  Likes to play Playstation as a distraction.  Also spoke appreciably about his uncle, and no matter how much he messes up at his job, he knows his uncle will yell, but then it will be over and his uncle will stick with him.   Daryel Geraldorth, Mandi Mattioli B 05/04/2014 1:14 PM
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
## 2014-05-04 NOTE — Progress Notes (Signed)
D: Pt denies SI/HI/AVH. Pt is pleasant and cooperative. Pt a little paranoid on approach, but pt talked to pt and decided to go to group after assessment.   A: Pt was offered support and encouragement. Pt was given scheduled medications. Pt was encourage to attend groups. Q 15 minute checks were done for safety.   R:Pt attends groups and interacts well with peers and staff. Pt is taking medication. Pt has no complaints at this time .Pt receptive to treatment and safety maintained on unit.

## 2014-05-04 NOTE — Progress Notes (Signed)
D: Pt has blunted, irritable affect and irritable mood.  Pt reports an improvement in his mood.  Pt reports his day has been "pretty good" and that his goal today was "to interact with all my groups and go outside and hopefully sleep better tonight."  Pt denies SI/HI, denies hallucinations.  He attended evening group and was visible in the milieu, watching TV with peers after group.   A: Met with pt 1:1 and offered support and encouragement.  Medication compliance encouraged.  Educated pt on scheduled night medication.  Safety maintained. R: Pt refused Trazodone at HS, reporting "I don't want to start anything else."  Pt reported that he would take medication if he has difficulty sleeping.  Pt verbally contracts for safety and is in no apparent distress.  Will continue to monitor and assess for safety.

## 2014-05-04 NOTE — Plan of Care (Signed)
Problem: Alteration in mood & ability to function due to Goal: STG-Patient will attend groups Outcome: Progressing Pt attended evening group on 05/03/14

## 2014-05-04 NOTE — Progress Notes (Signed)
Adult Psychoeducational Group Note  Date:  05/04/2014 Time:  9:51 PM  Group Topic/Focus:  Wrap-Up Group:   The focus of this group is to help patients review their daily goal of treatment and discuss progress on daily workbooks.  Participation Level:  Minimal  Participation Quality:  Drowsy  Affect:  Flat  Cognitive:  Lacking  Insight: Limited  Engagement in Group:  Limited  Modes of Intervention:  Support  Additional Comments:  Patient attended and participated in group tonight. He reports having a slow day. He spent most of the day in bed. He advised that he did attended group, went for lunch, watch television and socialized in the day room a little.  Lita MainsFrancis, Jailin Manocchio [facility] 05/04/2014, 9:51 PM

## 2014-05-05 MED ORDER — METHADONE HCL 5 MG PO TABS
15.0000 mg | ORAL_TABLET | Freq: Every day | ORAL | Status: DC
  Administered 2014-05-06: 15 mg via ORAL
  Filled 2014-05-05 (×2): qty 1

## 2014-05-05 MED ORDER — HALOPERIDOL 5 MG PO TABS
10.0000 mg | ORAL_TABLET | Freq: Two times a day (BID) | ORAL | Status: DC
  Administered 2014-05-05 – 2014-05-07 (×4): 10 mg via ORAL
  Filled 2014-05-05 (×4): qty 2
  Filled 2014-05-05: qty 56
  Filled 2014-05-05 (×3): qty 2
  Filled 2014-05-05 (×3): qty 56

## 2014-05-05 MED ORDER — METHADONE HCL 10 MG PO TABS
10.0000 mg | ORAL_TABLET | Freq: Once | ORAL | Status: AC
  Administered 2014-05-05: 10 mg via ORAL
  Filled 2014-05-05: qty 1

## 2014-05-05 MED ORDER — METHADONE HCL 5 MG PO TABS
5.0000 mg | ORAL_TABLET | Freq: Once | ORAL | Status: AC
  Administered 2014-05-05: 5 mg via ORAL
  Filled 2014-05-05: qty 1

## 2014-05-05 MED ORDER — TRAZODONE HCL 100 MG PO TABS
100.0000 mg | ORAL_TABLET | Freq: Every day | ORAL | Status: DC
  Administered 2014-05-06: 100 mg via ORAL
  Filled 2014-05-05: qty 1
  Filled 2014-05-05: qty 14
  Filled 2014-05-05: qty 1
  Filled 2014-05-05: qty 14
  Filled 2014-05-05: qty 1

## 2014-05-05 NOTE — Progress Notes (Signed)
D: Pt denies SI/HI/AVH. Pt is pleasant and cooperative. Pt stated he felt better, now that he back on . Pt sleep in room on approach, pt talks to Clinical research associatewriter and appears engaged.   A: Pt was offered support and encouragement. Pt was given scheduled medications. Pt was encourage to attend groups. Q 15 minute checks were done for safety.   R: Pt has no complaints.Pt receptive to treatment and safety maintained on unit.
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
## 2014-05-05 NOTE — Progress Notes (Signed)
D: Patient denies SI/HI and A/V hallucinations  A: Monitored q 15 minutes; patient encouraged to attend groups; patient educated about medications; patient given medications per physician orders; patient encouraged to express feelings and/or concerns  R: Patient isolates to his room; patient is cooperative but he is flat and blunted; patient is has not attended any groups but just attended the meals; patient has been resting; patient engages in conversation but forwards little and is guarded

## 2014-05-05 NOTE — [facility] Group Notes (Signed)
Adult Psychoeducational Group Note  Date:  05/05/2014 Time:  9:43 PM  Group Topic/Focus:  Wrap-Up Group:   The focus of this group is to help patients review their daily goal of treatment and discuss progress on daily workbooks.  Participation Level:  Did Not Attend  Participation Quality:  None  Affect:  None  Cognitive:  None  Insight: None  Engagement in Group:  None  Modes of Intervention:  Discussion  Additional Comments:  Maisie Fushomas did not attend group.  Caroll RancherLindsay, Rolin Schult A 05/05/2014, 9:43 PM

## 2014-05-05 NOTE — [facility] Group Notes (Addendum)
The focus of this group is to educate the patient on the purpose and policies of crisis stabilization and provide a format to answer questions about their admission.  The group details unit policies and expectations of patients while admitted. Patient did not attend this group. 
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
## 2014-05-05 NOTE — [facility] Group Notes (Deleted)
Adult Psychoeducational Group Note  Date:  05/05/2014 Time:  9:02 PM  Group Topic/Focus:  Wrap-Up Group:   The focus of this group is to help patients review their daily goal of treatment and discuss progress on daily workbooks.  Participation Level:  Minimal  Participation Quality:  Appropriate  Affect:  Flat  Cognitive:  Lacking  Insight: Limited  Engagement in Group:  Limited  Modes of Intervention:  Discussion  Additional Comments:  [name] stated his day was half way decent.  He stated he wanted to go to the mall to get some shoes and socks.  Pt. also stated he was working on his discharge plans.  [name] went on to state that his peers keep him motivated to get better by talking to him and making sure he doesn't isolate himself from the group.   Caroll RancherLindsay, Neriah Brott A 05/05/2014, 9:02 PM

## 2014-05-06 DIAGNOSIS — F1390 Sedative, hypnotic, or anxiolytic use, unspecified, uncomplicated: Secondary | ICD-10-CM

## 2014-05-06 DIAGNOSIS — F1994 Other psychoactive substance use, unspecified with psychoactive substance-induced mood disorder: Secondary | ICD-10-CM

## 2014-05-06 MED ORDER — ESCITALOPRAM OXALATE 10 MG PO TABS
10.0000 mg | ORAL_TABLET | Freq: Every day | ORAL | Status: DC
  Administered 2014-05-06: 10 mg via ORAL
  Filled 2014-05-06 (×4): qty 1

## 2014-05-06 MED ORDER — TRIAMCINOLONE ACETONIDE 0.5 % EX CREA
TOPICAL_CREAM | Freq: Two times a day (BID) | CUTANEOUS | Status: DC
  Administered 2014-05-06 – 2014-05-07 (×2): via TOPICAL
  Filled 2014-05-06: qty 15

## 2014-05-06 MED ORDER — COAL TAR EXTRACT 0.5 % EX SHAM
MEDICATED_SHAMPOO | Freq: Every day | CUTANEOUS | Status: DC | PRN
  Filled 2014-05-06: qty 251

## 2014-05-06 MED ORDER — METHADONE HCL 5 MG PO TABS
5.0000 mg | ORAL_TABLET | Freq: Once | ORAL | Status: AC
  Administered 2014-05-06: 5 mg via ORAL
  Filled 2014-05-06: qty 1

## 2014-05-06 MED ORDER — METHADONE HCL 10 MG PO TABS
20.0000 mg | ORAL_TABLET | Freq: Every day | ORAL | Status: DC
  Administered 2014-05-07: 20 mg via ORAL
  Filled 2014-05-06: qty 2

## 2014-05-06 MED ORDER — COAL TAR EXTRACT 10 % EX SHAM
1.0000 "application " | MEDICATED_SHAMPOO | Freq: Every day | CUTANEOUS | Status: DC
  Filled 2014-05-06: qty 236

## 2014-05-06 NOTE — Progress Notes (Addendum)
[facility]  05/06/2014 1:56 PM [name]  MRN:  [id number]   Subjective: Patient states,'I am OK".  Objective Patient seen and chart reviewed.   Patient continues to be guarded as well as withdrawn with some improvement. Patient has poor ADLs and needs a lot of encouragement. Patient today did not express any delusional thoughts. Patient denies SI/AH.VH/HI today. Patient more organized than yesterday. Patient was started on Methadone yesterday and that has helped his anxiety sx. Patient has been attending groups and has been making some progress.  Patient denies withdrawal sx, no tremors noted. VS wnl.  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 Abscess   . Heroin addiction     ADL's:  Impaired  Sleep: Fair  Appetite:  Fair   Psychiatric Specialty Exam: Physical Exam  ROS  Blood pressure 116/76, pulse 98, temperature 98.2 F (36.8 C), temperature source Oral, resp. rate 20, SpO2 99 %.There is no height or weight on file to calculate BMI.  General Appearance: Disheveled  Eye SolicitorContact::  Fair  Speech:  Normal Rate  Volume:  Decreased  Mood:  Anxious and Depressed  Affect:  Labile  Thought Process:  Disorganized and Irrelevant  Orientation:  Full (Time, Place, and Person)  Thought Content:  Delusions improving ,pt did not talk about his delusions today  Suicidal Thoughts:  No  Homicidal Thoughts:  No  Memory:  Immediate;   Fair Recent;   Fair Remote;   Fair   Judgement:  Impaired  Insight:  Lacking  Psychomotor Activity:  Decreased  Concentration:  Fair  Recall:  FiservFair  Fund of Knowledge:Poor  Language: Good  Akathisia:  No  Handed:  Right  AIMS (if indicated):     Assets:  Housing  Sleep:  Number of Hours: 6.25   Musculoskeletal: Strength & Muscle Tone: within normal limits Gait & Station: normal Patient leans: N/A  Current Medications: Current Facility-Administered Medications  Medication Dose Route Frequency Provider Last Rate Last Dose  . alum & mag hydroxide-simeth (MAALOX/MYLANTA) 200-200-20 MG/5ML suspension 30 mL  30 mL Oral Q4H PRN Shuvon Rankin, NP      . benztropine (COGENTIN) tablet 0.5 mg  0.5 mg Oral BID Jomarie LongsSaramma Ayodeji Keimig, MD   0.5 mg at 05/06/14 0802  . coal tar (NEUTROGENA T-GEL) 0.5 % shampoo   Topical Daily PRN Jomarie LongsSaramma Yoshua Geisinger, MD      . dicyclomine (BENTYL) tablet 20 mg  20 mg Oral Q6H PRN Natasia Sanko, MD      . escitalopram (LEXAPRO) tablet 10 mg  10 mg Oral Daily Shakyla Nolley, MD   10 mg at 05/06/14 1148  . haloperidol (HALDOL) tablet 10 mg  10 mg Oral BID Jomarie LongsSaramma Myers Tutterow, MD   10 mg at 05/06/14 0802  . hydrOXYzine (ATARAX/VISTARIL) tablet 25 mg  25 mg Oral Q6H PRN Jomarie LongsSaramma Sanna Porcaro, MD      . hydrOXYzine (ATARAX/VISTARIL) tablet 50 mg  50 mg Oral QHS PRN Kristeen MansFran E Hobson, NP   50 mg at 05/02/14 2120  . ibuprofen (ADVIL,MOTRIN) tablet 600 mg  600 mg Oral Q6H PRN Lindwood QuaSheila May Agustin, NP      .  loperamide (IMODIUM) capsule 2-4 mg  2-4 mg Oral PRN Jomarie LongsSaramma Tammala Weider, MD      . magnesium hydroxide (MILK OF MAGNESIA) suspension 30 mL  30 mL Oral Daily PRN Shuvon Rankin, NP      . methadone (DOLOPHINE) tablet 15 mg  15 mg Oral Daily Jomarie LongsSaramma Yaffa Seckman, MD   15 mg at 05/06/14 0802  . methocarbamol (ROBAXIN) tablet 500 mg  500 mg Oral Q8H PRN Torrin Crihfield, MD      . naproxen (NAPROSYN) tablet 500 mg  500 mg Oral BID PRN Jomarie LongsSaramma Frankey Botting, MD      . nicotine (NICODERM CQ - dosed in mg/24 hours) patch 21 mg  21 mg Transdermal Daily Shuvon  Rankin, NP   21 mg at 05/06/14 0802  . ondansetron (ZOFRAN-ODT) disintegrating tablet 4 mg  4 mg Oral Q6H PRN Jomarie LongsSaramma Marigrace Mccole, MD      . traZODone (DESYREL) tablet 100 mg  100 mg Oral QHS Maybell Misenheimer, MD   100 mg at 05/05/14 2200    Lab Results:  No results found for this or any previous visit (from the past 48 hour(s)).  Physical Findings: AIMS: Facial and Oral Movements Muscles of Facial Expression: None, normal Lips and Perioral Area: None, normal Jaw: None, normal Tongue: None, normal,Extremity Movements Upper (arms, wrists, hands, fingers): None, normal Lower (legs, knees, ankles, toes): None, normal, Trunk Movements Neck, shoulders, hips: None, normal, Overall Severity Severity of abnormal movements (highest score from questions above): None, normal Incapacitation due to abnormal movements: None, normal Patient's awareness of abnormal movements (rate only patient's report): No Awareness,    CIWA:    COWS:  COWS Total Score: 2  Treatment Plan Summary: Patient presented with psychosis. Patient was on methadone maintenance ,but his UDS positive for amphetamines,BZD as well as Cannabis. Patient today appears to be less anxious and more organized.  Per CSW -patient follows up with [facility] for Methadone maintenance. Patient to be started on Methadone smaller dose. Will titrate up higher based on how he responds. Will give Methadone 15 mg po qam today. Will assess patient for withdrawal sx and dosage increase.   Will continue  Haldol 10 mg po bid along with Cogentin 0.5 mg po bid. Will add a small dose of Lexapro 10 mg po daily. Will continue Trazodone 100 mg po qhs for sleep.      Medical Decision Making Problem Points:  Review of last therapy session (1) and Review of psycho-social stressors (1) Data Points:  Decision to obtain old records (1) Review or order clinical lab tests (1) Review and summation of old records (2) Review of medication regiment & side effects  (2) Review of new medications or change in dosage (2)  I certify that inpatient services furnished can reasonably be expected to improve the patient's condition.   Raylan Hanton MD 05/06/2014, 1:56 PM

## 2014-05-06 NOTE — Tx Team (Signed)
  Interdisciplinary Treatment Plan Update   Date Reviewed:  05/06/2014  Time Reviewed:  8:15 AM  Progress in Treatment:   Attending groups: Yes Participating in groups: Yes Taking medication as prescribed: Yes  Tolerating medication: Yes Family/Significant other contact made: Yes  Patient understands diagnosis: Yes  Discussing patient identified problems/goals with staff: Yes  See initial care plan Medical problems stabilized or resolved: Yes Denies suicidal/homicidal ideation: Yes  In tx team Patient has not harmed self or others: Yes  For review of initial/current patient goals, please see plan of care.  Estimated Length of Stay:  Likely d/c tomorrow  Reason for Continuation of Hospitalization:   New Problems/Goals identified:  N/A  Discharge Plan or Barriers:   return home, follow up outpt  Additional Comments:  Attendees:  Signature: Sarama Eappen, MD 05/06/2014 8:15 AM   Signature: Rod Raynette Arras, LCSW 05/06/2014 8:15 AM  Signature: Laura Davis, NP 05/06/2014 8:15 AM  Signature:  05/06/2014 8:15 AM  Signature: Patrice Queenan, RN 05/06/2014 8:15 AM  Signature:  05/06/2014 8:15 AM  Signature:   05/06/2014 8:15 AM  Signature:    Signature:    Signature:    Signature:    Signature:    Signature:      Scribe for Treatment Team:   Rod Katiejo Gilroy, LCSW  05/06/2014 8:15 AM  

## 2014-05-06 NOTE — [facility] Group Notes (Signed)
[facility]   05/06/2014 8:41 AM  Participation Quality:  Engaged  Mood/Affect:  Flat  Depression Rating:  5  Anxiety Rating:  0  Thoughts of Suicide:  No Will you contract for safety?   NA  Current AVH:  No  Plan for Discharge/Comments:  Presents as markedly better since introduction of low dose of methadone today.  Confirmed that he would return to [facility] at d/c.  [facility].  "10-4".  Thoughts are clear today.  No evidence of paranoia.  Wants to know if there is anything else he needs to do to get out of here.  Told him to get his clothes washed today.  Transportation Means: father  Supports: father  [name], Cylie Dor B

## 2014-05-06 NOTE — [facility] Group Notes (Signed)
[facility]  05/06/2014 , 12:25 PM    Type of Therapy:  [employer] Presentation  Participation Level:  Active  Participation Quality:  Attentive  Affect:  Blunted  Cognitive:  Oriented  Insight:  Limited  Engagement in Therapy:  Engaged  Modes of Intervention:  Discussion, Education and Socialization  Summary of Progress/Problems:  Onalee HuaDavid from [employer] came to present his recovery story and play the guitar.  Sat quietly throughout.  Was attentive.  William Geraldorth, [name] B 05/06/2014 , 12:25 PM

## 2014-05-06 NOTE — Progress Notes (Signed)
[facility] :  Will you be returning to the same living situation after discharge: Yes,  home At discharge, do you have transportation home?:Yes,  father Do you have the ability to pay for your medications:Yes,  mental health  Release of information consent forms completed and in the chart;  Patient's signature needed at discharge.  Patient to Follow up at: Follow-up Information    Follow up with [facility].   Why:  Return to your therapist and maintenance program the AM after d/c.      Follow up with [facility].   Why:  Go to the walk-in clinic M-F between 8 and 10AM for your hospital follow up appointment.  This is where you will see a psychiatrist for your psychiatric medications.   Contact information:   [address]  [place] [336] [phone]      Patient denies SI/HI:   Yes,  yes    Safety Planning and Suicide Prevention discussed:  Yes,  yes  [name], [name] 05/06/2014, 1:48 PM

## 2014-05-07 DIAGNOSIS — F1120 Opioid dependence, uncomplicated: Secondary | ICD-10-CM | POA: Insufficient documentation

## 2014-05-07 DIAGNOSIS — F19251 Other psychoactive substance dependence with psychoactive substance-induced psychotic disorder with hallucinations: Secondary | ICD-10-CM

## 2014-05-07 DIAGNOSIS — F19239 Other psychoactive substance dependence with withdrawal, unspecified: Secondary | ICD-10-CM | POA: Insufficient documentation

## 2014-05-07 MED ORDER — HALOPERIDOL 10 MG PO TABS: 10.0000 mg | ORAL_TABLET | Freq: Two times a day (BID) | ORAL | Status: DC

## 2014-05-07 MED ORDER — HYDROXYZINE HCL 25 MG PO TABS: 25.0000 mg | ORAL_TABLET | Freq: Four times a day (QID) | ORAL | Status: DC | PRN

## 2014-05-07 MED ORDER — BENZTROPINE MESYLATE 0.5 MG PO TABS: 0.5000 mg | ORAL_TABLET | Freq: Two times a day (BID) | ORAL | Status: DC

## 2014-05-07 MED ORDER — TRAZODONE HCL 100 MG PO TABS: 100.0000 mg | ORAL_TABLET | Freq: Every day | ORAL | Status: DC

## 2014-05-07 MED ORDER — METHOCARBAMOL 500 MG PO TABS: 500.0000 mg | ORAL_TABLET | Freq: Three times a day (TID) | ORAL | Status: DC | PRN

## 2014-05-07 MED ORDER — NICOTINE 21 MG/24HR TD PT24: 21.0000 mg | MEDICATED_PATCH | Freq: Every day | TRANSDERMAL | Status: DC

## 2014-05-07 MED ORDER — TRIAMCINOLONE ACETONIDE 0.5 % EX CREA: TOPICAL_CREAM | Freq: Two times a day (BID) | CUTANEOUS | Status: DC

## 2014-05-07 NOTE — Progress Notes (Signed)
Adult Psychoeducational Group Note  Date:  05/07/2014 Time:  0900 Group Topic/Focus:  Goals Group:   The focus of this group is to help patients establish daily goals to achieve during treatment and discuss how the patient can incorporate goal setting into their daily lives to aide in recovery.  Participation Level:  Active  Participation Quality:  Appropriate  Affect:  Appropriate  Cognitive:  Appropriate  Insight: Appropriate  Engagement in Group:  Engaged  Modes of Intervention:  Discussion and Education  Additional Comments:   Bresnan, Jaielle Dlouhy L 05/07/2014, 1:08 PM

## 2014-05-07 NOTE — [facility] Suicide Risk Assessment (Addendum)
[facility] INPATIENT:  Family/Significant Other Suicide Prevention Education  Suicide Prevention Education:  Education Completed; No one has been identified by the patient as the family member/significant other with whom the patient will be residing, and identified as the person(s) who will aid the patient in the event of a mental health crisis (suicidal ideations/suicide attempt).  With written consent from the patient, the family member/significant other has been provided the following suicide prevention education, prior to the and/or following the discharge of the patient.  The suicide prevention education provided includes the following:  Suicide risk factors  Suicide prevention and interventions  National Suicide Hotline telephone number  [facility] assessment telephone number  [facility] Emergency Assistance 911  [facility] and/or Residential Mobile Crisis Unit telephone number  Request made of family/significant other to:  Remove weapons (e.g., guns, rifles, knives), all items previously/currently identified as safety concern.    Remove drugs/medications (over-the-counter, prescriptions, illicit drugs), all items previously/currently identified as a safety concern.  The family member/significant other verbalizes understanding of the suicide prevention education information provided.  The family member/significant other agrees to remove the items of safety concern listed above. The patient did not endorse SI at the time of admission, nor did the patient c/o SI during the stay here.  SPE not required. However, I did speak with father several times who IVCed son to get him to us when he became concerned about level of AH and paranoia.  Encouraged him to do same if symptoms return.  Father is Mr [name], [place][336] [phone].  Daryel Geraldorth, Elliot Meldrum B 05/07/2014, 9:50 AM
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
## 2014-05-07 NOTE — Progress Notes (Signed)
D: Pt denies SI/HI/AVH. Pt is pleasant and cooperative. Pt stated he had a great day. Pt talked to his dad. Pt plans to figure things out with his dad when he leaves [facility].  A: Pt was offered support and encouragement. Pt was given scheduled medications. Pt was encourage to attend groups. Q 15 minute checks were done for safety. EKG done  R:Pt attends groups and interacts well with peers and staff. Pt is taking medication. Pt has no complaints at this time.Pt receptive to treatment and safety maintained on unit.

## 2014-05-07 NOTE — Progress Notes (Signed)
Adult Psychoeducational Group Note  Date:  05/07/2014 Time:  1:22 PM  Group Topic/Focus:  Self Esteem Action Plan:   The focus of this group is to help patients create a plan to continue to build self-esteem after discharge.  Participation Level:  Active  Participation Quality:  Appropriate  Affect:  Appropriate  Cognitive:  Alert and Appropriate  Insight: Appropriate  Engagement in Group:  Engaged  Modes of Intervention:  Education and Support  Additional Comments:  Pt participated in group.  [name], [name] 05/07/2014, 1:22 PM

## 2014-05-07 NOTE — [facility] Suicide Risk Assessment (Signed)
   Demographic Factors:  Male and Caucasian  Total Time spent with patient: 45 minutes  Psychiatric Specialty Exam: Physical Exam  ROS  Blood pressure 133/73, pulse 120, temperature 97.7 F (36.5 C), temperature source Oral, resp. rate 20, SpO2 99 %.There is no height or weight on file to calculate BMI.  General Appearance: Casual  Eye Contact::  Fair  Speech:  Clear and Coherent  Volume:  Normal  Mood:  Euthymic  Affect:  Congruent  Thought Process:  Coherent  Orientation:  Full (Time, Place, and Person)  Thought Content:  WDL  Suicidal Thoughts:  No  Homicidal Thoughts:  No  Memory:  Immediate;   Fair Recent;   Fair Remote;   Fair  Judgement:  Intact  Insight:  Fair  Psychomotor Activity:  Normal  Concentration:  Fair  Recall:  [employer]Fair  Fund of Knowledge:Fair  Language: Good  Akathisia:  No  Handed:  Right  AIMS (if indicated):     Assets:  Communication Skills Desire for Improvement  Sleep:  Number of Hours: 6.75    Musculoskeletal: Strength & Muscle Tone: within normal limits Gait & Station: normal Patient leans: N/A   Mental Status Per Nursing Assessment::   On Admission:  NA  Current Mental Status by Physician: Patient currently reports feeling better,less anxious ,pleasant ,is not delusional about his dad being dead anymore ,reports awaiting call from dad,denies SI/HI/AH/VH.Patient to follow up with [facility] for Methadone maintenance.  Loss Factors: NA  Historical Factors: Impulsivity  Risk Reduction Factors:   Living with another person, especially a relative and Positive social support  Continued Clinical Symptoms:  Alcohol/Substance Abuse/Dependencies Previous Psychiatric Diagnoses and Treatments  Cognitive Features That Contribute To Risk:  Polarized thinking    Suicide Risk:  Minimal: No identifiable suicidal ideation.    Discharge Diagnoses: DSM5: Primary Psychiatric Diagnosis: Substance induced (amphetamine,opioid,BZD,Cannabis)  psychotic disorder(RESOLVED)  Secondary Psychiatric Diagnosis: Opioid use disorder,severe (on methadone maintenance) Opoid withdrawal,moderate Amphetamine use disorder,unspecified Cannabis use disorder ,moderate Benzodiazepine use disorder,unspecifed   Non Psychiatric Diagnosis: See PMH   Past Medical History  Diagnosis Date  . Abscess   . Heroin addiction     Plan Of Care/Follow-up recommendations:  Activity:  no restrictions Diet:  carb modified  Is patient on multiple antipsychotic therapies at discharge:  No   Has Patient had three or more failed trials of antipsychotic monotherapy by history:  No  Recommended Plan for Multiple Antipsychotic Therapies: NA    Anzel Kearse MD 05/07/2014, 9:41 AM

## 2014-05-07 NOTE — Progress Notes (Signed)
Patient ID: [name], male   DOB: August 22, 1980, 33 y.o.   MRN: [id number]  Pt discharged home with grandfather to live with his grandparents. Pt was stable, smiling and expressed his gratitude toward staff. All papers and prescriptions were given and valuables returned. Verbal understanding expressed. Pt was able to voice concerns and ask questions. Pt able to verbalize coping skills and long term goals. Denies SI/HI and A/VH.   Aurora Maskwyman, Jaidah Lomax E, RN

## 2014-05-07 NOTE — Progress Notes (Signed)
Patient ID: [name], male   DOB: 1981-01-31, 33 y.o.   MRN: [id number]  D: Pt currently presents with an appropriate affect and pleasant behavior. Per self inventory, pt rates depression at a 0, hopelessness 0 and anxiety 0. Pt's daily goal is to "stay positive and when I leave set more goals" and they intend to do so by "write my goals." Pt reports good sleep, concentration and a appetite. Pt writes "Thanks for all the help the nurses have been very patient with me." Pt states " I have been writing "gangsta raps" to help cope with bad thoughts. Pt states " I feel much better today."   A: Pt provided with medications per providers orders. Pt's labs and vitals were monitored throughout the day. Pt supported emotionally and encouraged to express concerns and questions. Pt consulted with provider and social worker. Pt educated on medications and coping skills.   R:Pt's safety ensured with 15 minute and environmental checks. Pt currently denies SI/HI and A/V hallucinations. Pt verbally agrees to seek staff if SI/HI or A/VH occurs and to consult with staff before acting on these thoughts. Pt to be discharged.    Aurora Maskwyman, Kurt Hoffmeier E, RN

## 2014-05-11 NOTE — Progress Notes (Signed)
Patient Discharge Instructions:  After Visit Summary (AVS):   Faxed to:  05/11/14 Discharge Summary Note:   Faxed to:  05/11/14 Psychiatric Admission Assessment Note:   Faxed to:  05/11/14 Suicide Risk Assessment - Discharge Assessment:   Faxed to:  05/11/14 Faxed/Sent to the Next Level Care provider:  05/11/14 Faxed to [facility] @ [phone] [facility] @ [phone] Jerelene ReddenSheena E [place], 05/11/2014, 1:40 PM

## 2014-05-21 ENCOUNTER — Encounter (HOSPITAL_COMMUNITY): Payer: Self-pay | Admitting: Emergency Medicine

## 2014-05-21 ENCOUNTER — Emergency Department (HOSPITAL_COMMUNITY)
Admission: EM | Admit: 2014-05-21 | Discharge: 2014-05-21 | Disposition: A | Discharge status: Home / Self Care | Payer: Self-pay | Attending: Emergency Medicine | Admitting: Emergency Medicine

## 2014-05-21 DIAGNOSIS — R51 Headache: Secondary | ICD-10-CM | POA: Insufficient documentation

## 2014-05-21 DIAGNOSIS — Z79899 Other long term (current) drug therapy: Secondary | ICD-10-CM | POA: Insufficient documentation

## 2014-05-21 DIAGNOSIS — H61891 Other specified disorders of right external ear: Secondary | ICD-10-CM | POA: Insufficient documentation

## 2014-05-21 DIAGNOSIS — R000 Tachycardia, unspecified: Secondary | ICD-10-CM | POA: Insufficient documentation

## 2014-05-21 DIAGNOSIS — H6191 Disorder of right external ear, unspecified: Secondary | ICD-10-CM

## 2014-05-21 DIAGNOSIS — I10 Essential (primary) hypertension: Secondary | ICD-10-CM | POA: Insufficient documentation

## 2014-05-21 DIAGNOSIS — Z720 Tobacco use: Secondary | ICD-10-CM | POA: Insufficient documentation

## 2014-05-21 DIAGNOSIS — Z7952 Long term (current) use of systemic steroids: Secondary | ICD-10-CM | POA: Insufficient documentation

## 2014-05-21 DIAGNOSIS — L723 Sebaceous cyst: Secondary | ICD-10-CM | POA: Insufficient documentation

## 2014-05-21 MED ORDER — LIDOCAINE 4 % EX CREA
TOPICAL_CREAM | Freq: Once | CUTANEOUS | Status: AC
  Administered 2014-05-21: 1 via TOPICAL
  Filled 2014-05-21: qty 5

## 2014-05-21 MED ORDER — HYDROCODONE-ACETAMINOPHEN 5-325 MG PO TABS
1.0000 | ORAL_TABLET | Freq: Once | ORAL | Status: AC
  Administered 2014-05-21: 1 via ORAL
  Filled 2014-05-21: qty 1

## 2014-05-21 NOTE — Discharge Instructions (Signed)
Your cyst will need to be evaluated and treated by a surgeon to remove it permanently. Keep wound clean and dry. Apply warm compresses to affected area throughout the day. Take tylenol or motrin as needed for pain. Monitor area for signs of infection to include, but not limited to: increasing pain, redness, drainage/pus, or swelling. Return to emergency department for emergent changing or worsening symptoms.   Epidermal Cyst An epidermal cyst is usually a small, painless lump under the skin. Cysts often occur on the face, neck, stomach, chest, or genitals. The cyst may be filled with a bad smelling paste. Do not pop your cyst. Popping the cyst can cause pain and puffiness (swelling). HOME CARE   Only take medicines as told by your doctor.  Take your medicine (antibiotics) as told. Finish it even if you start to feel better. GET HELP RIGHT AWAY IF:  Your cyst is tender, red, or puffy.  You are not getting better, or you are getting worse.  You have any questions or concerns. MAKE SURE YOU:  Understand these instructions.  Will watch your condition.  Will get help right away if you are not doing well or get worse. Document Released: 06/29/2004 Document Revised: 11/21/2011 Document Reviewed: 11/28/2010 [facility] Patient Information 2015 [employer] [place]. This information is not intended to replace advice given to you by your health care provider. Make sure you discuss any questions you have with your health care provider.

## 2014-05-21 NOTE — ED Notes (Signed)
Patient states he has abscess to R earlobe that "has been there for 3 years.  But it started getting bigger and I think it needs to be fixed".   Patient states painful.   Patient denies other problems.

## 2014-05-21 NOTE — ED Provider Notes (Signed)
CSN: [id number]     Arrival date & time 05/21/14  [id number] History  This chart was scribed for non-physician practitioner, Allen DerryMercedes Camprubi-Soms, [name], working with Mirian MoMatthew Gentry, MD by Charline BillsEssence Howell, ED Scribe. This patient was seen in room TR08C/TR08C and the patient's care was started at 9:31 AM.   Chief Complaint  Patient presents with  . Abscess   Patient is a 33 y.o. male presenting with abscess. The history is provided by the patient. No language interpreter was used.  Abscess Location:  Head/neck Head/neck abscess location:  R ear Abscess quality: painful and warmth   Abscess quality: not draining and no redness   Red streaking: no   Progression:  Worsening Pain details:    Quality:  Throbbing   Severity:  Moderate   Timing:  Constant   Progression:  Worsening Chronicity:  New Relieved by:  Nothing Ineffective treatments:  NSAIDs Associated symptoms: headaches   Associated symptoms: no fever, no nausea and no vomiting   Risk factors: prior abscess    HPI Comments: [name] is a 33 y.o. male who presents to the Emergency Department complaining of constant, gradually growing "abscess" to R earlobe present for the past 3 years, worse over the past few months. Pt describes his pain as a throbbing sensation that radiates into his neck. Pt currently rates his pain 8/10. Pain is exacerbated with lying on his R ear. He also reports associated HA and mild warmth to his earlobe. He denies fever, redness, drainage, red streaking, abdominal pain, nausea, vomiting, SOB, chest pain, visual disturbances. He has tried ibuprofen without relief. Pt reports h/o abscess.   Past Medical History  Diagnosis Date  . Abscess   . Heroin addiction    No past surgical history on file. No family history on file. History  Substance Use Topics  . Smoking status: Current Every Day Smoker -- 1.50 packs/day for 15 years  . Smokeless tobacco: Never Used  . Alcohol Use: Yes     Comment:  occasionally    Review of Systems  Constitutional: Negative for fever.  Eyes: Negative for visual disturbance.  Respiratory: Negative for shortness of breath.   Cardiovascular: Negative for chest pain.  Gastrointestinal: Negative for nausea, vomiting and abdominal pain.  Skin: Negative for color change.       + Abscess  Neurological: Positive for headaches.  10 Systems reviewed and all are negative for acute change except as noted in the HPI.  Allergies  Review of patient's allergies indicates no known allergies.  Home Medications   Prior to Admission medications   Medication Sig Start Date End Date Taking? Authorizing Provider  benztropine (COGENTIN) 0.5 MG tablet Take 1 tablet (0.5 mg total) by mouth 2 (two) times daily. 05/07/14   Velna HatchetSheila May Agustin, NP  haloperidol (HALDOL) 10 MG tablet Take 1 tablet (10 mg total) by mouth 2 (two) times daily. 05/07/14   Lindwood QuaSheila May Agustin, NP  hydrOXYzine (ATARAX/VISTARIL) 25 MG tablet Take 1 tablet (25 mg total) by mouth every 6 (six) hours as needed for anxiety. 05/07/14   Velna HatchetSheila May Agustin, NP  methocarbamol (ROBAXIN) 500 MG tablet Take 1 tablet (500 mg total) by mouth every 8 (eight) hours as needed for muscle spasms. 05/07/14   Velna HatchetSheila May Agustin, NP  nicotine (NICODERM CQ - DOSED IN MG/24 HOURS) 21 mg/24hr patch Place 1 patch (21 mg total) onto the skin daily. 05/07/14   Lindwood QuaSheila May Agustin, NP  traZODone (DESYREL) 100 MG tablet Take 1 tablet (  100 mg total) by mouth at bedtime. 05/07/14   Velna HatchetSheila May Agustin, NP  triamcinolone cream (KENALOG) 0.5 % Apply topically 2 (two) times daily. 05/07/14   Lindwood QuaSheila May Agustin, NP   Triage Vitals: BP 139/107 mmHg  Pulse 115  Temp(Src) 97.7 F (36.5 C) (Oral)  Resp 18  SpO2 100% Physical Exam  Constitutional: He is oriented to person, place, and time. He appears well-developed and well-nourished.  Non-toxic appearance. No distress.  Baseline tachycardia and hypertension, NAD and afebrile  HENT:  Head:  Normocephalic and atraumatic.  Mouth/Throat: Mucous membranes are normal.  R earlobe with cystic structure at base of lobe, well circumscribed and fluid-filled without fluctuance or induration, no erythema or warmth.   Eyes: Conjunctivae and EOM are normal. Right eye exhibits no discharge. Left eye exhibits no discharge.  Neck: Normal range of motion. Neck supple.  Cardiovascular: Intact distal pulses.  Tachycardia present.   Slightly tachycardic, which improved down to 105 and is similar to baseline.  Pulmonary/Chest: Effort normal. No respiratory distress.  Abdominal: Normal appearance. He exhibits no distension.  Musculoskeletal: Normal range of motion.  Lymphadenopathy:    He has no cervical adenopathy.  No cervical LAD  Neurological: He is alert and oriented to person, place, and time.  Skin: Skin is warm, dry and intact. No rash noted. No erythema.  Cyst on R earlobe as above  Psychiatric: He has a normal mood and affect. His behavior is normal.  Nursing note and vitals reviewed.  ED Course  Fine needle aspiration Date/Time: 05/21/2014 10:56 AM Performed by: CAMPRUBI-SOMS, Antwyne Pingree STRUPP Authorized by: Ramond MarrowAMPRUBI-SOMS, Wajiha Versteeg STRUPP Consent: Verbal consent obtained. Risks and benefits: risks, benefits and alternatives were discussed Consent given by: patient Patient understanding: patient states understanding of the procedure being performed Patient consent: the patient's understanding of the procedure matches consent given Patient identity confirmed: verbally with patient Preparation: Patient was prepped and draped in the usual sterile fashion. Local anesthesia used: yes Local anesthetic: topical anesthetic Patient sedated: no Patient tolerance: Patient tolerated the procedure well with no immediate complications Comments: 21G needle aspiration of cyst on L earlobe, drained clear fluid   (including critical care time) DIAGNOSTIC STUDIES: Oxygen Saturation is 100% on RA,  normal by my interpretation.    COORDINATION OF CARE: 9:37 AM-Discussed treatment plan which includes I&D and Vicodin with pt at bedside and pt agreed to plan.   Labs Review Labs Reviewed - No data to display  Imaging Review No results found.   EKG Interpretation None      MDM   Final diagnoses:  Sebaceous cyst  Earlobe lesion, right    33 y.o. male with a cyst to R earlobe. No signs of surrounding cellulitis, no fluctuance, well circumscribed, no warmth or erythema. Discussed that permanent removal of this lesion would be at a surgeon's office, but that a needle aspiration could be performed today to provide some relief of the pressure pain. Needle aspiration performed and notable for a clear watery substance, no purulent drainage. Patient tolerated this well after being given Norco and topical lidocaine numbing. Of note, patient is slightly tachycardic which is consistent with his prior ED visits. Repeat vitals were improved and close to baseline. Discussed heat and Tylenol or Motrin for pain, and follow-up with surgeon for permanent removal. I explained the diagnosis and have given explicit precautions to return to the ER including for any other new or worsening symptoms. The patient understands and accepts the medical plan as it's been dictated and  I have answered their questions. Discharge instructions concerning home care and prescriptions have been given. The patient is STABLE and is discharged to home in good condition.   I personally performed the services described in this documentation, which was scribed in my presence. The recorded information has been reviewed and is accurate.  BP 147/88 mmHg  Pulse 105  Temp(Src) 97.7 F (36.5 C) (Oral)  Resp 18  SpO2 98%  Meds ordered this encounter  Medications  . lidocaine (LMX) 4 % cream    Sig:   . HYDROcodone-acetaminophen (NORCO/VICODIN) 5-325 MG per tablet 1 tablet    Sig:      Donnita [place],  [name] 05/21/14 1112  Mirian Mo, MD 05/25/14 [phone]

## 2015-03-12 ENCOUNTER — Ambulatory Visit (HOSPITAL_COMMUNITY)
Admission: RE | Admit: 2015-03-12 | Discharge: 2015-03-12 | Disposition: A | Discharge status: Home / Self Care | Payer: Federal, State, Local not specified - Other | Attending: Psychiatry | Admitting: Psychiatry

## 2015-03-12 ENCOUNTER — Encounter (HOSPITAL_COMMUNITY): Payer: Self-pay | Admitting: *Deleted

## 2015-03-12 ENCOUNTER — Emergency Department (HOSPITAL_COMMUNITY)
Admission: EM | Admit: 2015-03-12 | Discharge: 2015-03-13 | Disposition: A | Discharge status: Home / Self Care | Payer: Federal, State, Local not specified - Other | Attending: Emergency Medicine | Admitting: Emergency Medicine

## 2015-03-12 DIAGNOSIS — F1220 Cannabis dependence, uncomplicated: Secondary | ICD-10-CM | POA: Diagnosis present

## 2015-03-12 DIAGNOSIS — F22 Delusional disorders: Secondary | ICD-10-CM

## 2015-03-12 DIAGNOSIS — F1120 Opioid dependence, uncomplicated: Secondary | ICD-10-CM

## 2015-03-12 DIAGNOSIS — Z7952 Long term (current) use of systemic steroids: Secondary | ICD-10-CM | POA: Insufficient documentation

## 2015-03-12 DIAGNOSIS — F19251 Other psychoactive substance dependence with psychoactive substance-induced psychotic disorder with hallucinations: Secondary | ICD-10-CM

## 2015-03-12 DIAGNOSIS — R441 Visual hallucinations: Secondary | ICD-10-CM | POA: Insufficient documentation

## 2015-03-12 DIAGNOSIS — Z872 Personal history of diseases of the skin and subcutaneous tissue: Secondary | ICD-10-CM | POA: Insufficient documentation

## 2015-03-12 DIAGNOSIS — R440 Auditory hallucinations: Secondary | ICD-10-CM

## 2015-03-12 DIAGNOSIS — F13951 Sedative, hypnotic or anxiolytic use, unspecified with sedative, hypnotic or anxiolytic-induced psychotic disorder with hallucinations: Secondary | ICD-10-CM | POA: Insufficient documentation

## 2015-03-12 DIAGNOSIS — F19239 Other psychoactive substance dependence with withdrawal, unspecified: Secondary | ICD-10-CM

## 2015-03-12 DIAGNOSIS — F1590 Other stimulant use, unspecified, uncomplicated: Secondary | ICD-10-CM

## 2015-03-12 DIAGNOSIS — F1320 Sedative, hypnotic or anxiolytic dependence, uncomplicated: Secondary | ICD-10-CM

## 2015-03-12 DIAGNOSIS — F1910 Other psychoactive substance abuse, uncomplicated: Secondary | ICD-10-CM

## 2015-03-12 DIAGNOSIS — Z720 Tobacco use: Secondary | ICD-10-CM | POA: Insufficient documentation

## 2015-03-12 DIAGNOSIS — F419 Anxiety disorder, unspecified: Secondary | ICD-10-CM | POA: Insufficient documentation

## 2015-03-12 DIAGNOSIS — Z79899 Other long term (current) drug therapy: Secondary | ICD-10-CM | POA: Insufficient documentation

## 2015-03-12 LAB — URINALYSIS, ROUTINE W REFLEX MICROSCOPIC
Glucose, UA: NEGATIVE mg/dL
Ketones, ur: 15 mg/dL — AB
LEUKOCYTES UA: NEGATIVE
NITRITE: NEGATIVE
PH: 5.5 (ref 5.0–8.0)
Protein, ur: 30 mg/dL — AB
SPECIFIC GRAVITY, URINE: 1.043 — AB (ref 1.005–1.030)
Urobilinogen, UA: 1 mg/dL (ref 0.0–1.0)

## 2015-03-12 LAB — DIFFERENTIAL
BASOS ABS: 0 10*3/uL (ref 0.0–0.1)
Basophils Relative: 0 %
Eosinophils Absolute: 0 10*3/uL (ref 0.0–0.7)
Eosinophils Relative: 0 %
LYMPHS ABS: 3.8 10*3/uL (ref 0.7–4.0)
LYMPHS PCT: 21 %
MONOS PCT: 6 %
Monocytes Absolute: 1.1 10*3/uL — ABNORMAL HIGH (ref 0.1–1.0)
NEUTROS ABS: 13.2 10*3/uL — AB (ref 1.7–7.7)
Neutrophils Relative %: 73 %

## 2015-03-12 LAB — COMPREHENSIVE METABOLIC PANEL
ALK PHOS: 88 U/L (ref 38–126)
ALT: 16 U/L — AB (ref 17–63)
AST: 23 U/L (ref 15–41)
Albumin: 4.9 g/dL (ref 3.5–5.0)
Anion gap: 13 (ref 5–15)
BUN: 13 mg/dL (ref 6–20)
CALCIUM: 10.7 mg/dL — AB (ref 8.9–10.3)
CO2: 27 mmol/L (ref 22–32)
Chloride: 98 mmol/L — ABNORMAL LOW (ref 101–111)
Creatinine, Ser: 0.95 mg/dL (ref 0.61–1.24)
GFR calc non Af Amer: >60 mL/min (ref >60)
Glucose, Bld: 118 mg/dL — ABNORMAL HIGH (ref 65–99)
Potassium: 3.8 mmol/L (ref 3.5–5.1)
Sodium: 138 mmol/L (ref 135–145)
Total Bilirubin: 0.7 mg/dL (ref 0.3–1.2)
Total Protein: 9.5 g/dL — ABNORMAL HIGH (ref 6.5–8.1)

## 2015-03-12 LAB — ACETAMINOPHEN LEVEL: Acetaminophen (Tylenol), Serum: 15 ug/mL (ref 10–30)

## 2015-03-12 LAB — URINE MICROSCOPIC-ADD ON

## 2015-03-12 LAB — CBC
HCT: 49.9 % (ref 39.0–52.0)
HEMOGLOBIN: 17.5 g/dL — AB (ref 13.0–17.0)
MCH: 32.4 pg (ref 26.0–34.0)
MCHC: 35.1 g/dL (ref 30.0–36.0)
MCV: 92.4 fL (ref 78.0–100.0)
Platelets: 303 10*3/uL (ref 150–400)
RBC: 5.4 MIL/uL (ref 4.22–5.81)
RDW: 14.4 % (ref 11.5–15.5)
WBC: 18.3 10*3/uL — ABNORMAL HIGH (ref 4.0–10.5)

## 2015-03-12 LAB — SALICYLATE LEVEL

## 2015-03-12 LAB — ETHANOL

## 2015-03-12 MED ORDER — ACETAMINOPHEN 325 MG PO TABS: 650.0000 mg | ORAL_TABLET | ORAL | Status: DC | PRN

## 2015-03-12 MED ORDER — IBUPROFEN 200 MG PO TABS: 600.0000 mg | ORAL_TABLET | Freq: Three times a day (TID) | ORAL | Status: DC | PRN

## 2015-03-12 MED ORDER — NICOTINE 21 MG/24HR TD PT24
21.0000 mg | MEDICATED_PATCH | Freq: Every day | TRANSDERMAL | Status: DC
  Administered 2015-03-12: 21 mg via TRANSDERMAL
  Filled 2015-03-12: qty 1

## 2015-03-12 MED ORDER — ALUM & MAG HYDROXIDE-SIMETH 200-200-20 MG/5ML PO SUSP
30.0000 mL | ORAL | Status: DC | PRN
  Administered 2015-03-12: 30 mL via ORAL
  Filled 2015-03-12: qty 30

## 2015-03-12 MED ORDER — LORAZEPAM 1 MG PO TABS
1.0000 mg | ORAL_TABLET | Freq: Three times a day (TID) | ORAL | Status: DC | PRN
  Administered 2015-03-12: 1 mg via ORAL
  Filled 2015-03-12: qty 1

## 2015-03-12 MED ORDER — ZOLPIDEM TARTRATE 5 MG PO TABS
5.0000 mg | ORAL_TABLET | Freq: Every evening | ORAL | Status: DC | PRN
  Administered 2015-03-12: 5 mg via ORAL
  Filled 2015-03-12: qty 1

## 2015-03-12 MED ORDER — ONDANSETRON HCL 4 MG PO TABS: 4.0000 mg | ORAL_TABLET | Freq: Three times a day (TID) | ORAL | Status: DC | PRN

## 2015-03-12 NOTE — ED Notes (Signed)
Patient states that he has been hearing voices for about a week. He states that the voices do not tell him to do anything, it's just "crazy stuff". Patient denies homicidal or suicidal ideations at this time. Patient has had similar problems in the past but it was due to drug use "meth". Patient denies current drug [place]. Patient wants help with the voices and hallucinations. He has had inpatient treatment in the past.

## 2015-03-12 NOTE — ED Notes (Addendum)
Sesar has attempted to give urine sample X 3 and has been unsuccessful.  Offered water.  Notified Nurse Practitioner.  Plan to monitor for another hour and will do bladder scan if not successful.  [name] has been pleasant and cooperative.  He continues to report hearing voices but denies that they are telling him to harm himself.  He denies any current suicidal ideation and states that he will talk with staff if that changes.

## 2015-03-12 NOTE — BH Assessment (Addendum)
[facility] Assessment Progress Note  The following facilities have been contacted to seek placement for this pt, with results as noted:  Beds available, information sent, decision pending:  [place] [facility] [place] [facility]   At capacity, but accepting referrals for future consideration; information sent:  [facility]   At capacity:  [facility] [place] [facility]   Doylene Canning, [place] Triage Specialist [phone]

## 2015-03-12 NOTE — Progress Notes (Signed)
D  Pt. Denies SI and HI, no complaints of pain or discomfort noted at present time. Pt. Was extremely anxious d/t having been in the bathroom for a long period of time attempting to urinate.  Diastolic and pulse were high.  A Writer offered support and encouragement, discussed difficulty with urination and coping skills with pt. Writer gave pt. A pitcher of water and instructed him to relax, drink the water and try later.  R Pt. Reports the voices have reduced since his admission.  Pt. Reported to Clinical research associate that he often has difficulty with urination.  States he will at times go several times a day and then have days where he only goes a couple of times. Writer did an abdominal check, no distention or pain noted.  Pt. Is now relaxed reporting he feels he will be able to urinate soon after drinking the pitcher of water.  Will continue to assess and monitor d/t elevated WBC. Pt. Is afebrile.

## 2015-03-12 NOTE — ED Provider Notes (Signed)
CSN: [id number]     Arrival date & time 03/12/15  1218 History  By signing my name below, I, Murriel Hopper, attest that this documentation has been prepared under the direction and in the presence of Wynetta Emery, [name]  Electronically Signed: Murriel Hopper, ED Scribe. 03/12/2015. 1:42 PM.    Chief Complaint  Patient presents with  . Hallucinations      The history is provided by the patient. No language interpreter was used.   HPI Comments: BENJAMEN KOELLING is a 34 y.o. male who presents to the Emergency Department complaining of recurrent, intermittent hallucinations for two weeks. Pt states he has been seeing demons and hearing voices frequently when he tries to go to sleep. Pt reports watching youtube videos and doing research recently on a devil-worshiping cult, and reports in the last two weeks his hallucinations have become worse. Pt states the voices he hears tell him lies, such as, "you're going to prison". Pt states the voices do not tell him to do anything, but he states he wants them to go away. Pt states he has been praying a lot to try to combat his hallucinations, with little relief. Pt has been seen here before for similar symptoms. Pt reports a hx of IV drug abuse and hx of meth abuse as well. Pt states he has been clean for a few months and takes methadone consistently. Pt reports upon last discharge from Behavioral health that he was given several prescriptions, and reports having a distonic reaction to Haldol.      Past Medical History  Diagnosis Date  . Abscess   . Heroin addiction (HCC)    History reviewed. No pertinent past surgical history. History reviewed. No pertinent family history. Social History  Substance Use Topics  . Smoking status: Current Every Day Smoker -- 1.50 packs/day for 15 years  . Smokeless tobacco: Never Used  . Alcohol Use: Yes     Comment: occasionally    Review of Systems  A complete 10 system review of systems was obtained and all  systems are negative except as noted in the HPI and PMH.    Allergies  Review of patient's allergies indicates no known allergies.  Home Medications   Prior to Admission medications   Medication Sig Start Date End Date Taking? Authorizing Provider  benztropine (COGENTIN) 0.5 MG tablet Take 1 tablet (0.5 mg total) by mouth 2 (two) times daily. 05/07/14   Adonis Brook, NP  haloperidol (HALDOL) 10 MG tablet Take 1 tablet (10 mg total) by mouth 2 (two) times daily. 05/07/14   Adonis Brook, NP  hydrOXYzine (ATARAX/VISTARIL) 25 MG tablet Take 1 tablet (25 mg total) by mouth every 6 (six) hours as needed for anxiety. 05/07/14   Adonis Brook, NP  methocarbamol (ROBAXIN) 500 MG tablet Take 1 tablet (500 mg total) by mouth every 8 (eight) hours as needed for muscle spasms. 05/07/14   Adonis Brook, NP  nicotine (NICODERM CQ - DOSED IN MG/24 HOURS) 21 mg/24hr patch Place 1 patch (21 mg total) onto the skin daily. 05/07/14   Adonis Brook, NP  traZODone (DESYREL) 100 MG tablet Take 1 tablet (100 mg total) by mouth at bedtime. 05/07/14   Adonis Brook, NP  triamcinolone cream (KENALOG) 0.5 % Apply topically 2 (two) times daily. 05/07/14   Adonis Brook, NP   BP 148/114 mmHg  Pulse 117  Temp(Src) 98.6 F (37 C) (Oral)  Resp 18  Ht  (1.778 m)  Wt 245 lb (111.131 kg)  BMI 35.15 kg/m2  SpO2 94% Physical Exam  Constitutional: He is oriented to person, place, and time. He appears well-developed and well-nourished. No distress.  HENT:  Head: Normocephalic and atraumatic.  Mouth/Throat: Oropharynx is clear and moist.  Eyes: Conjunctivae and EOM are normal. Pupils are equal, round, and reactive to light.  Neck: Normal range of motion.  Cardiovascular: Normal rate, regular rhythm and intact distal pulses.   Pulmonary/Chest: Effort normal and breath sounds normal.  Abdominal: Soft. He exhibits no distension. There is no tenderness.  Musculoskeletal: Normal range of motion.  Neurological: He is  alert and oriented to person, place, and time.  Skin: Skin is warm and dry. He is not diaphoretic.  Psychiatric: His mood appears anxious. He is actively hallucinating. He is not agitated and not aggressive. Thought content is paranoid. He exhibits a depressed mood. He expresses no homicidal and no suicidal ideation.  Nursing note and vitals reviewed.   ED Course  Procedures (including critical care time)  DIAGNOSTIC STUDIES: Oxygen Saturation is 94% on room air, normal by my interpretation.    COORDINATION OF CARE: 1:35 PM Discussed treatment plan with pt at bedside and pt agreed to plan.   Labs Review Labs Reviewed  COMPREHENSIVE METABOLIC PANEL  ETHANOL  SALICYLATE LEVEL  ACETAMINOPHEN LEVEL  CBC  URINE RAPID DRUG SCREEN, HOSP PERFORMED    Imaging Review No results found. I have personally reviewed and evaluated these images and lab results as part of my medical decision-making.   EKG Interpretation None      MDM   Final diagnoses:  Auditory hallucinations  Paranoia (HCC)   Filed Vitals:   03/12/15 1222 03/12/15 1427  BP: 148/114 138/102  Pulse: 117 105  Temp: 98.6 F (37 C) 98.1 F (36.7 C)  TempSrc: Oral Oral  Resp: 18 20  Height:  (1.778 m)   Weight: 245 lb (111.131 kg)   SpO2: 94% 96%    Medications  alum & mag hydroxide-simeth (MAALOX/MYLANTA) 200-200-20 MG/5ML suspension 30 mL (not administered)  ondansetron (ZOFRAN) tablet 4 mg (not administered)  nicotine (NICODERM CQ - dosed in mg/24 hours) patch 21 mg (not administered)  zolpidem (AMBIEN) tablet 5 mg (not administered)  ibuprofen (ADVIL,MOTRIN) tablet 600 mg (not administered)  acetaminophen (TYLENOL) tablet 650 mg (not administered)  LORazepam (ATIVAN) tablet 1 mg (not administered)    DUAN SCHARNHORST is a pleasant 34 y.o. male presenting with auditory hallucinations, non-command also paranoia. It appears that patient had a dystonic reaction to Haldol in the past. Has not taken any  medications in the last year.  Patient is medically cleared for psychiatric evaluation will be transferred to the psych ED. TTS consulted, home meds and psych standard holding orders placed.   I personally performed the services described in this documentation, which was scribed in my presence. The recorded information has been reviewed and is accurate.    Wynetta Emery, [name] 03/12/15 1630  Leta Baptist, MD 03/12/15 [phone]

## 2015-03-12 NOTE — BH Assessment (Signed)
Patient presented to [facility] as a walk in for a TTS assessment. He was evaluated by Ava. Per Ava's request, [facility] was contacted [phone] to verify methadone maintenance needs, dosage, and compliance.   Clinical research associate contacted [facility]. Per the secretary "Delice Bison" @ the facility this patient is already closed for the day. The facility closed today at 12 noon. They will re-open tomorrow @  6am-8:30am. Writer is unable to verify the information Ava has requested.   The secretary did allow this Clinical research associate to leave a message requesting staff to return call. The secretary assured that she would provide staff with the message in the morning.

## 2015-03-12 NOTE — BH Assessment (Addendum)
Assessment Note  Patient is a 34 year old [place] male that reports seeing shadows for over a year and hearing voices telling him that he is going to go to jail.  Patient reports that the voices are also telling him that his, "father is going to set up to go to jail".  Patient states that he has been hearing voices for about a week.   He states that the voices do not tell him to do anything.  "it's just "crazy stuff". Patient denies homicidal or suicidal ideations at this time. Patient reports hearing voices in the past but it was due to drug use "meth".   Patient denies current drug [place]. Patient that his last use was Adderall last month and he reports only taking a couple of pills.  Patient reports that he receives methadone maintenance of  daily from [facility].    Patient reports that he wants help with the voices and hallucinations. Patient reports previous psychiatric hospitalization at [facility] in 2015 for detox and depression.    Patient denies SI and HI.  Patient denies command hallucinations to harm himself or others.  Patient reports that he just wants the voices to stop.  Patient denies physical, sexual or emotional abuse.   During the assessment the patient was responding to internal stimuli.       Diagnosis: Mood Disorder, NOS and Opioid Abuse   Past Medical History:  Past Medical History  Diagnosis Date  . Abscess   . Heroin addiction     No past surgical history on file.  Family History: No family history on file.  Social History:  reports that he has been smoking.  He has never used smokeless tobacco. He reports that he drinks alcohol. He reports that he does not use illicit drugs.  Additional Social History:  Alcohol / Drug Use Prescriptions: [facility]adone [facility]  History of alcohol / drug use?: Yes Longest period of sobriety (when/how long): 1 month  Negative Consequences of Use: Work / Programmer, multimedia, Copywriter, advertising relationships, Armed forces operational officer,  Surveyor, quantity Withdrawal Symptoms:  (None Reported) Substance #3 Name of Substance 3: Alcohol  3 - Age of First Use: 14 3 - Amount (size/oz): varies 3 - Frequency: varies 3 - Duration: since the age of 26. 3 - Last Use / Amount: Patient reports that his last use was one year ago.   CIWA:   COWS:    Allergies: No Known Allergies  Home Medications:  (Not in a hospital admission)  OB/GYN Status:  No LMP for male patient.  General Assessment Data Location of Assessment: [facility] Services (Walk In ) TTS Assessment: In system Is this a Tele or Face-to-Face Assessment?: Face-to-Face Is this an Initial Assessment or a Re-assessment for this encounter?: Initial Assessment Marital status: Single Maiden name: NA Is patient pregnant?: Other (Comment) (NA) Pregnancy Status: Other (Comment) (NA) Living Arrangements:  (Lives with his father ) Can pt return to current living arrangement?: Yes Admission Status: Voluntary Is patient capable of signing voluntary admission?: Yes Referral Source: Self/Family/Friend Insurance type: Self Pay   Medical Screening Exam [facility] Walk-in ONLY) Medical Exam completed: Yes (Sent to [employer] for clearance)  Crisis Care Plan Living Arrangements:  (Lives with his father ) Name of Psychiatrist: None Reported Name of Therapist: None Reported  Education Status Is patient currently in school?: No Current Grade: NA Highest grade of school patient has completed: NA Name of school: NA Contact person: NA  Risk to self with the past  6 months Suicidal Ideation: No Has patient been a risk to self within the past 6 months prior to admission? : No Suicidal Intent: No Has patient had any suicidal intent within the past 6 months prior to admission? : No Is patient at risk for suicide?: No Suicidal Plan?: No Has patient had any suicidal plan within the past 6 months prior to admission? : No Access to Means: No What has been your use of drugs/alcohol within the  last 12 months?: Adderall ; Heroine; Alcohol Previous Attempts/Gestures: No How many times?: 0 Other Self Harm Risks: None Reported Triggers for Past Attempts:  (NA) Intentional Self Injurious Behavior: None Family Suicide History: No Recent stressful life event(s): Job Loss, Financial Problems Persecutory voices/beliefs?: Yes Depression: Yes Depression Symptoms: Despondent, Fatigue, Guilt, Loss of interest in usual pleasures, Feeling worthless/self pity Substance abuse history and/or treatment for substance abuse?: Yes Suicide prevention information given to non-admitted patients: Not applicable  Risk to Others within the past 6 months Homicidal Ideation: No Does patient have any lifetime risk of violence toward others beyond the six months prior to admission? : No Thoughts of Harm to Others: No Current Homicidal Intent: No Current Homicidal Plan: No Access to Homicidal Means: No Identified Victim: NA History of harm to others?: Yes Assessment of Violence: In distant past Violent Behavior Description: Calm and cooperative Does patient have access to weapons?: No Criminal Charges Pending?: No Does patient have a court date: No Is patient on probation?: No  Psychosis Hallucinations: Auditory, Visual Delusions: Grandiose (Feels as if his father wants him in jail)  Mental Status Report Appearance/Hygiene: Body odor, Disheveled, Poor hygiene Eye Contact: Good Motor Activity: Freedom of movement, Restlessness Speech: Logical/coherent Level of Consciousness: Alert, Restless, Irritable Mood: Anxious, Suspicious, Sad, Worthless, low self-esteem Affect: Anxious Anxiety Level: Minimal Thought Processes: Coherent, Relevant Judgement: Unimpaired Orientation: Person, Place, Time, Situation Obsessive Compulsive Thoughts/Behaviors: None  Cognitive Functioning Concentration: Decreased Memory: Recent Intact, Remote Intact IQ: Average Insight: Fair Impulse Control: Poor Appetite:  Good Weight Loss: 0 Weight Gain: 0 Sleep: Increased Total Hours of Sleep: 9 Vegetative Symptoms: Decreased grooming, Not bathing, Staying in bed  ADLScreening [facility] Assessment Services) Patient's cognitive ability adequate to safely complete daily activities?: Yes Patient able to express need for assistance with ADLs?: Yes Independently performs ADLs?: Yes (appropriate for developmental age)  Prior Inpatient Therapy Prior Inpatient Therapy: Yes Prior Therapy Dates: 2015 Prior Therapy Facilty/Provider(s): [facility]  Reason for Treatment: SA and Depression  Prior Outpatient Therapy Prior Outpatient Therapy: Yes Prior Therapy Dates: Ongoing  Prior [facility]ilty[facility]  Reason for Treatment: Methadone maintiance  Does patient have an ACCT team?: No Does patient have Intensive In-House Services?  : No Does patient have [facility] services? : No Does patient have P4CC services?: No  ADL Screening (condition at time of admission) Patient's cognitive ability adequate to safely complete daily activities?: Yes Is the patient deaf or have difficulty hearing?: No Does the patient have difficulty seeing, even when wearing glasses/contacts?: No Does the patient have difficulty concentrating, remembering, or making decisions?: Yes Patient able to express need for assistance with ADLs?: Yes Does the patient have difficulty dressing or bathing?: No Independently performs ADLs?: Yes (appropriate for developmental age) Does the patient have difficulty walking or climbing stairs?: No Weakness of Legs: None Weakness of Arms/Hands: None  Home Assistive Devices/Equipment Home Assistive Devices/Equipment: None    Abuse/Neglect Assessment (Assessment to be complete while patient is alone) Physical Abuse: Denies Verbal Abuse: Denies Sexual Abuse: Denies  Exploitation of patient/patient's resources: Denies Self-Neglect: Denies Values / Beliefs Cultural Requests During Hospitalization:  None Spiritual Requests During Hospitalization: None Consults Spiritual Care Consult Needed: No Social Work Consult Needed: No Merchant navy officer (For Healthcare) Does patient have an advance directive?: No Would patient like information on creating an advanced directive?: No - patient declined information    Additional Information 1:1 In Past 12 Months?: No CIRT Risk: No Elopement Risk: No Does patient have medical clearance?: Yes     Disposition: Per Dr. Dub Mikes the patient meets criteria for inpatient hospitalization once medically cleared.  Disposition Initial Assessment Completed for this Encounter: Yes Disposition of Patient: Inpatient treatment program (Per Dr. Dub Mikes - pt meets inpt criteria once medically cleared)  On Site Evaluation by:   Reviewed with Physician:    Phillip Heal LaVerne 03/12/2015 12:22 PM

## 2015-03-12 NOTE — ED Notes (Signed)
Pt has in belonging bag:  Brier shirt, grey baseball hat, grey sweat pants, Clemence shoes, two packs of cigs, brown lighter, black phone, keys and black key fob, and blue bag with clothes.

## 2015-03-12 NOTE — BH Assessment (Addendum)
Per Dr. Dub Mikes patient meets criteria for inpatient hospitalization once medically cleared.  Patient was a walk in at [facility] and reports seeing shadow people and hearing voices telling him that he is going to jail.  During the assessment the patient was responding to internal stimuili.  Patient reports receiving daily doses of Methadone of 130 mg daily.  Patient reports that he relapsed and used Adderall a month ago but he has not used since.    Patient denies SI and HI.  Patient denies command hallucinations to harm himself or others.  Patient reports that he just wants the voices to stop   Writer informed the Charge Nurse ([place]) that the patient will be coming to the ED and that the patient does not have a bed at [facility].  The patient will be considered for placement at [facility] and referred out to other facilities.

## 2015-03-13 DIAGNOSIS — F19251 Other psychoactive substance dependence with psychoactive substance-induced psychotic disorder with hallucinations: Secondary | ICD-10-CM | POA: Diagnosis not present

## 2015-03-13 LAB — RAPID URINE DRUG SCREEN, HOSP PERFORMED
Amphetamines: NOT DETECTED
BENZODIAZEPINES: POSITIVE — AB
Barbiturates: NOT DETECTED
COCAINE: NOT DETECTED
OPIATES: NOT DETECTED
Tetrahydrocannabinol: NOT DETECTED

## 2015-03-13 NOTE — [facility] Suicide Risk Assessment (Signed)
Suicide Risk Assessment  Discharge Assessment   [facility] Discharge Suicide Risk Assessment   Demographic Factors:  Male and Caucasian  Total Time spent with patient: 45 minutes  Musculoskeletal: Strength & Muscle Tone: within normal limits Gait & Station: normal Patient leans: N/A  Psychiatric Specialty Exam: Review of Systems  Constitutional: Negative.   HENT: Negative.   Eyes: Negative.   Respiratory: Negative.   Cardiovascular: Negative.   Gastrointestinal: Negative.   Genitourinary: Negative.   Musculoskeletal: Negative.   Skin: Negative.   Neurological: Negative.   Endo/Heme/Allergies: Negative.   Psychiatric/Behavioral: Positive for hallucinations and substance abuse.    Blood pressure 142/89, pulse 82, temperature 97.9 F (36.6 C), temperature source Oral, resp. rate 18, height  (1.778 m), weight 111.131 kg (245 lb), SpO2 98 %.Body mass index is 35.15 kg/(m^2).  General Appearance: Casual  Eye Contact::  Good  Speech:  Normal Rate  Volume:  Normal  Mood:  Euthymic  Affect:  Congruent  Thought Process:  Coherent  Orientation:  Full (Time, Place, and Person)  Thought Content:  WDL  Suicidal Thoughts:  No  Homicidal Thoughts:  No  Memory:  Immediate;   Good Recent;   Good Remote;   Good  Judgement:  Good  Insight:  Good  Psychomotor Activity:  Normal  Concentration:  Good  Recall:  Good  Fund of Knowledge:Good  Language: Good  Akathisia:  No  Handed:  Right  AIMS (if indicated):     Assets:  Leisure Time Physical Health Resilience Social Support  ADL's:  Intact  Cognition: WNL  Sleep:         Has this patient used any form of tobacco in the last 30 days? (Cigarettes, Smokeless Tobacco, Cigars, and/or Pipes) Yes, A prescription for an FDA-approved tobacco cessation medication was offered at discharge and the patient refused  Mental Status Per Nursing Assessment::   On Admission:   Hallucinations  Current Mental Status by Physician: NA  Loss  Factors: NA  Historical Factors: Family history of mental illness or substance abuse  Risk Reduction Factors:   Sense of responsibility to family, Living with another person, especially a relative, Positive social support and Positive therapeutic relationship  Continued Clinical Symptoms:  None  Cognitive Features That Contribute To Risk:  None    Suicide Risk:  Minimal: No identifiable suicidal ideation.  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 Opioid dependence (HCC) [F11.20] 05/04/2014    Priority: High  . 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 Cannabis use disorder, moderate, dependence (HCC) [F12.20] 05/04/2014    Priority: High  . Polysubstance abuse [F19.10] 04/28/2014    Priority: High  . Opioid dependence with withdrawal (HCC) [F11.23]   . Psychoses [F29]   . Delusional disorder (HCC) [F22] 04/28/2014      Plan Of Care/Follow-up recommendations:  Activity:  as tolerated Diet:  heart healthy diet  Is patient on multiple antipsychotic therapies at discharge:  No   Has Patient had three or more failed trials of antipsychotic monotherapy  by history:  No  Recommended Plan for Multiple Antipsychotic Therapies: NA    LORD, JAMISON, PMH-NP 03/13/2015, 2:22 PM

## 2015-03-13 NOTE — Consult Note (Signed)
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 Opioid dependence ([place]) [F11.20] 05/04/2014    Priority: High  . 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 Cannabis use disorder, moderate, dependence ([place]) [F12.20] 05/04/2014    Priority: High  . Polysubstance abuse [F19.10] 04/28/2014    Priority: High  . Opioid dependence with withdrawal ([place]) [F11.23]   . Psychoses [F29]   . Delusional disorder ([place]) [F22] 04/28/2014    Total Time spent with patient: 45 minutes  Subjective:   [name] is a 34 y.o. male patient does not warrant admission.  HPI:  34 yo male presented to the ED with hallucinations, auditory and visual.  History of polysubstance abuse.  Today, he denies hallucinations and only came to "have them checked out."  Denies suicidal/homicidal ideations, hallucinations, and alcohol/drug abuse.  He claims to go to [facility] Methadone clinic and "dosed" yesterday, negative drug screen and no withdrawal symptoms.  Stable for discharge  Past Psychiatric History: Substance abuse  Risk to Self: Is patient at risk for suicide?: No, but patient needs Medical Clearance Risk to Others:   Prior Inpatient Therapy:   Prior Outpatient Therapy:    Past Medical History:  Past Medical History  Diagnosis Date  . Abscess   . Heroin addiction ([place])    History reviewed. No pertinent past surgical history. Family History: History reviewed. No pertinent family  history. Family Psychiatric  History: Substance abuse Social History:  History  Alcohol Use  . Yes    Comment: occasionally     History  Drug Use No    Comment: recovering last use Oct 2013    Social History   Social History  . Marital Status: Single    Spouse Name: N/A  . Number of Children: N/A  . Years of Education: N/A   Social History Main Topics  . Smoking status: Current Every Day Smoker -- 1.50 packs/day for 15 years  . Smokeless tobacco: Never Used  . Alcohol Use: Yes     Comment: occasionally  . Drug Use: No     Comment: recovering last use Oct 2013  . Sexual Activity: No   Other Topics Concern  . None   Social History Narrative   Additional Social History:                          Allergies:  No Known Allergies  Labs:  Results for orders placed or performed during the hospital encounter of 03/12/15 (from the past 48 hour(s))  Urine rapid drug screen (hosp performed) (Not at [facility])     Status: Abnormal   Collection Time: 03/12/15 12:20 PM  Result Value Ref Range   Opiates NONE DETECTED NONE DETECTED   Cocaine NONE DETECTED NONE DETECTED   Benzodiazepines POSITIVE (A) NONE DETECTED   Amphetamines NONE DETECTED NONE DETECTED   Tetrahydrocannabinol NONE DETECTED NONE DETECTED   Barbiturates NONE DETECTED NONE DETECTED    Comment:        DRUG SCREEN FOR MEDICAL PURPOSES  ONLY.  IF CONFIRMATION IS NEEDED FOR ANY PURPOSE, NOTIFY LAB WITHIN 5 DAYS.        LOWEST DETECTABLE LIMITS FOR URINE DRUG SCREEN Drug Class       Cutoff (ng/mL) Amphetamine      1000 Barbiturate      200 Benzodiazepine   621 Tricyclics       308 Opiates          300 Cocaine          300 THC              50   Comprehensive metabolic panel     Status: Abnormal   Collection Time: 03/12/15  1:16 PM  Result Value Ref Range   Sodium 138 135 - 145 mmol/L   Potassium 3.8 3.5 - 5.1 mmol/L   Chloride 98 (L) 101 - 111 mmol/L   CO2 27 22 - 32 mmol/L   Glucose, Bld 118 (H)  65 - 99 mg/dL   BUN 13 6 - 20 mg/dL   Creatinine, Ser 0.95 0.61 - 1.24 mg/dL   Calcium 10.7 (H) 8.9 - 10.3 mg/dL   Total Protein 9.5 (H) 6.5 - 8.1 g/dL   Albumin 4.9 3.5 - 5.0 g/dL   AST 23 15 - 41 U/L   ALT 16 (L) 17 - 63 U/L   Alkaline Phosphatase 88 38 - 126 U/L   Total Bilirubin 0.7 0.3 - 1.2 mg/dL   GFR calc non Af Amer >60 >60 mL/min   GFR calc Af Amer >60 >60 mL/min    Comment: (NOTE) The eGFR has been calculated using the CKD EPI equation. This calculation has not been validated in all clinical situations. eGFR's persistently <60 mL/min signify possible Chronic Kidney Disease.    Anion gap 13 5 - 15  Ethanol (ETOH)     Status: None   Collection Time: 03/12/15  1:16 PM  Result Value Ref Range   Alcohol, Ethyl (B) <5 <5 mg/dL    Comment:        LOWEST DETECTABLE LIMIT FOR SERUM ALCOHOL IS 5 mg/dL FOR MEDICAL PURPOSES ONLY   Salicylate level     Status: None   Collection Time: 03/12/15  1:16 PM  Result Value Ref Range   Salicylate Lvl <6.5 2.8 - 30.0 mg/dL  Acetaminophen level     Status: None   Collection Time: 03/12/15  1:16 PM  Result Value Ref Range   Acetaminophen (Tylenol), Serum 15 10 - 30 ug/mL    Comment:        THERAPEUTIC CONCENTRATIONS VARY SIGNIFICANTLY. A RANGE OF 10-30 ug/mL MAY BE AN EFFECTIVE CONCENTRATION FOR MANY PATIENTS. HOWEVER, SOME ARE BEST TREATED AT CONCENTRATIONS OUTSIDE THIS RANGE. ACETAMINOPHEN CONCENTRATIONS >150 ug/mL AT 4 HOURS AFTER INGESTION AND >50 ug/mL AT 12 HOURS AFTER INGESTION ARE OFTEN ASSOCIATED WITH TOXIC REACTIONS.   CBC     Status: Abnormal   Collection Time: 03/12/15  1:16 PM  Result Value Ref Range   WBC 18.3 (H) 4.0 - 10.5 K/uL   RBC 5.40 4.22 - 5.81 MIL/uL   Hemoglobin 17.5 (H) 13.0 - 17.0 g/dL   HCT 49.9 39.0 - 52.0 %   MCV 92.4 78.0 - 100.0 fL   MCH 32.4 26.0 - 34.0 pg   MCHC 35.1 30.0 - 36.0 g/dL   RDW 14.4 11.5 - 15.5 %   Platelets 303 150 - 400 K/uL  Differential     Status: Abnormal    Collection Time: 03/12/15  1:16 PM  Result Value Ref Range   Neutrophils Relative % 73 %   Neutro Abs 13.2 (H) 1.7 - 7.7 K/uL   Lymphocytes Relative 21 %   Lymphs Abs 3.8 0.7 - 4.0 K/uL   Monocytes Relative 6 %   Monocytes Absolute 1.1 (H) 0.1 - 1.0 K/uL   Eosinophils Relative 0 %   Eosinophils Absolute 0.0 0.0 - 0.7 K/uL   Basophils Relative 0 %   Basophils Absolute 0.0 0.0 - 0.1 K/uL  Urinalysis, Routine w reflex microscopic (not at [facility])     Status: Abnormal   Collection Time: 03/12/15  9:18 PM  Result Value Ref Range   Color, Urine AMBER (A) YELLOW    Comment: BIOCHEMICALS MAY BE AFFECTED BY COLOR   APPearance CLEAR CLEAR   Specific Gravity, Urine 1.043 (H) 1.005 - 1.030   pH 5.5 5.0 - 8.0   Glucose, UA NEGATIVE NEGATIVE mg/dL   Hgb urine dipstick TRACE (A) NEGATIVE   Bilirubin Urine MODERATE (A) NEGATIVE   Ketones, ur 15 (A) NEGATIVE mg/dL   Protein, ur 30 (A) NEGATIVE mg/dL   Urobilinogen, UA 1.0 0.0 - 1.0 mg/dL   Nitrite NEGATIVE NEGATIVE   Leukocytes, UA NEGATIVE NEGATIVE  Urine microscopic-add on     Status: Abnormal   Collection Time: 03/12/15  9:18 PM  Result Value Ref Range   WBC, UA 0-2 <3 WBC/hpf   Casts HYALINE CASTS (A) NEGATIVE   Urine-Other MUCOUS PRESENT     Current Facility-Administered Medications  Medication Dose Route Frequency Provider Last Rate Last Dose  . acetaminophen (TYLENOL) tablet 650 mg  650 mg Oral Q4H PRN Nicole Pisciotta, [name]      . alum & mag hydroxide-simeth (MAALOX/MYLANTA) 200-200-20 MG/5ML suspension 30 mL  30 mL Oral PRN Monico Blitz, [name]   30 mL at 03/12/15 2151  . ibuprofen (ADVIL,MOTRIN) tablet 600 mg  600 mg Oral Q8H PRN Nicole Pisciotta, [name]      . nicotine (NICODERM CQ - dosed in mg/24 hours) patch 21 mg  21 mg Transdermal Daily Nicole Pisciotta, [name]   21 mg at 03/12/15 1733  . ondansetron (ZOFRAN) tablet 4 mg  4 mg Oral Q8H PRN Nicole Pisciotta, [name]      . zolpidem (AMBIEN) tablet 5 mg  5 mg Oral QHS PRN Monico Blitz, [name]   5 mg at 03/12/15 2130   Current Outpatient Prescriptions  Medication Sig Dispense Refill  . doxylamine, Sleep, (UNISOM) 25 MG tablet Take 250 mg by mouth at bedtime as needed for sleep.    . methadone (DOLOPHINE) 10 MG/ML solution Take 130 mg by mouth daily.    . benztropine (COGENTIN) 0.5 MG tablet Take 1 tablet (0.5 mg total) by mouth 2 (two) times daily. (Patient not taking: Reported on 03/12/2015) 60 tablet 0  . haloperidol (HALDOL) 10 MG tablet Take 1 tablet (10 mg total) by mouth 2 (two) times daily. (Patient not taking: Reported on 03/12/2015) 60 tablet 0  . hydrOXYzine (ATARAX/VISTARIL) 25 MG tablet Take 1 tablet (25 mg total) by mouth every 6 (six) hours as needed for anxiety. (Patient not taking: Reported on 03/12/2015) 30 tablet 0  . methocarbamol (ROBAXIN) 500 MG tablet Take 1 tablet (500 mg total) by mouth every 8 (eight) hours as needed for muscle spasms. (Patient not taking: Reported on 03/12/2015) 30 tablet 0  . nicotine (NICODERM CQ - DOSED IN MG/24 HOURS) 21 mg/24hr patch Place 1 patch (21 mg total) onto the skin daily. (Patient not  taking: Reported on 03/12/2015) 28 patch 0  . traZODone (DESYREL) 100 MG tablet Take 1 tablet (100 mg total) by mouth at bedtime. (Patient not taking: Reported on 03/12/2015) 30 tablet 0  . triamcinolone cream (KENALOG) 0.5 % Apply topically 2 (two) times daily. (Patient not taking: Reported on 03/12/2015) 30 g 0    Musculoskeletal: Strength & Muscle Tone: within normal limits Gait & Station: normal Patient leans: N/A  Psychiatric Specialty Exam: Review of Systems  Constitutional: Negative.   HENT: Negative.   Eyes: Negative.   Respiratory: Negative.   Cardiovascular: Negative.   Gastrointestinal: Negative.   Genitourinary: Negative.   Musculoskeletal: Negative.   Skin: Negative.   Neurological: Negative.   Endo/Heme/Allergies: Negative.   Psychiatric/Behavioral: Positive for hallucinations and substance abuse.    Blood  pressure 142/89, pulse 82, temperature 97.9 F (36.6 C), temperature source Oral, resp. rate 18, height 5' 10"  (1.778 m), weight 111.131 kg (245 lb), SpO2 98 %.Body mass index is 35.15 kg/(m^2).  General Appearance: Casual  Eye Contact::  Good  Speech:  Normal Rate  Volume:  Normal  Mood:  Euthymic  Affect:  Congruent  Thought Process:  Coherent  Orientation:  Full (Time, Place, and Person)  Thought Content:  WDL  Suicidal Thoughts:  No  Homicidal Thoughts:  No  Memory:  Immediate;   Good Recent;   Good Remote;   Good  Judgement:  Good  Insight:  Good  Psychomotor Activity:  Normal  Concentration:  Good  Recall:  Good  Fund of Knowledge:Good  Language: Good  Akathisia:  No  Handed:  Right  AIMS (if indicated):     Assets:  Leisure Time Physical Health Resilience Social Support  ADL's:  Intact  Cognition: WNL  Sleep:      Treatment Plan Summary: Daily contact with patient to assess and evaluate symptoms and progress in treatment, Medication management and Plan substance induced psychosis -Crisis stabilization -Individual counseling -Substance abuse counseling  Disposition: No evidence of imminent risk to self or others at present.    Waylan Boga, PMH_NP 03/13/2015 2:15 PM  Patient seen, chart reviewed and case discussed with treatment team including physician extender and formulated treatment plan. Reviewed the information documented and agree with the treatment plan.  Jen Benedict,JANARDHAHA R. 03/14/2015 10:35 AM
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
## 2015-03-14 LAB — URINE CULTURE: CULTURE: NO GROWTH

## 2015-03-15 ENCOUNTER — Encounter (HOSPITAL_COMMUNITY): Payer: Self-pay | Admitting: *Deleted

## 2015-03-15 ENCOUNTER — Emergency Department (HOSPITAL_COMMUNITY)
Admission: EM | Admit: 2015-03-15 | Discharge: 2015-03-16 | Disposition: A | Discharge status: Other Acute Inpatient Hospital | Payer: Federal, State, Local not specified - Other | Attending: Emergency Medicine | Admitting: Emergency Medicine

## 2015-03-15 DIAGNOSIS — F911 Conduct disorder, childhood-onset type: Secondary | ICD-10-CM | POA: Insufficient documentation

## 2015-03-15 DIAGNOSIS — R21 Rash and other nonspecific skin eruption: Secondary | ICD-10-CM | POA: Insufficient documentation

## 2015-03-15 DIAGNOSIS — R4689 Other symptoms and signs involving appearance and behavior: Secondary | ICD-10-CM

## 2015-03-15 DIAGNOSIS — Z8719 Personal history of other diseases of the digestive system: Secondary | ICD-10-CM | POA: Insufficient documentation

## 2015-03-15 DIAGNOSIS — F22 Delusional disorders: Secondary | ICD-10-CM | POA: Diagnosis present

## 2015-03-15 DIAGNOSIS — Z79899 Other long term (current) drug therapy: Secondary | ICD-10-CM | POA: Insufficient documentation

## 2015-03-15 DIAGNOSIS — Z872 Personal history of diseases of the skin and subcutaneous tissue: Secondary | ICD-10-CM | POA: Insufficient documentation

## 2015-03-15 DIAGNOSIS — F29 Unspecified psychosis not due to a substance or known physiological condition: Secondary | ICD-10-CM | POA: Diagnosis present

## 2015-03-15 DIAGNOSIS — Z720 Tobacco use: Secondary | ICD-10-CM | POA: Insufficient documentation

## 2015-03-15 DIAGNOSIS — F1120 Opioid dependence, uncomplicated: Secondary | ICD-10-CM | POA: Diagnosis present

## 2015-03-15 HISTORY — DX: Gastro-esophageal reflux disease without esophagitis: K21.9

## 2015-03-15 LAB — URINE MICROSCOPIC-ADD ON

## 2015-03-15 LAB — URINALYSIS, ROUTINE W REFLEX MICROSCOPIC
Glucose, UA: NEGATIVE mg/dL
HGB URINE DIPSTICK: NEGATIVE
Ketones, ur: >80 mg/dL — AB
Leukocytes, UA: NEGATIVE
Nitrite: NEGATIVE
PROTEIN: 100 mg/dL — AB
Specific Gravity, Urine: 1.033 — ABNORMAL HIGH (ref 1.005–1.030)
UROBILINOGEN UA: 1 mg/dL (ref 0.0–1.0)
pH: 6 (ref 5.0–8.0)

## 2015-03-15 LAB — CBC
HEMATOCRIT: 47.4 % (ref 39.0–52.0)
HEMOGLOBIN: 16.4 g/dL (ref 13.0–17.0)
MCH: 31.5 pg (ref 26.0–34.0)
MCHC: 34.6 g/dL (ref 30.0–36.0)
MCV: 91 fL (ref 78.0–100.0)
Platelets: 241 10*3/uL (ref 150–400)
RBC: 5.21 MIL/uL (ref 4.22–5.81)
RDW: 14 % (ref 11.5–15.5)
WBC: 19.9 10*3/uL — ABNORMAL HIGH (ref 4.0–10.5)

## 2015-03-15 LAB — CBC WITH DIFFERENTIAL/PLATELET
BASOS PCT: 0 %
Basophils Absolute: 0 10*3/uL (ref 0.0–0.1)
EOS ABS: 0 10*3/uL (ref 0.0–0.7)
Eosinophils Relative: 0 %
HCT: 51.5 % (ref 39.0–52.0)
HEMOGLOBIN: 18.1 g/dL — AB (ref 13.0–17.0)
Lymphocytes Relative: 18 %
Lymphs Abs: 4 10*3/uL (ref 0.7–4.0)
MCH: 31.8 pg (ref 26.0–34.0)
MCHC: 35.1 g/dL (ref 30.0–36.0)
MCV: 90.4 fL (ref 78.0–100.0)
MONOS PCT: 7 %
Monocytes Absolute: 1.5 10*3/uL — ABNORMAL HIGH (ref 0.1–1.0)
NEUTROS PCT: 75 %
Neutro Abs: 16.2 10*3/uL — ABNORMAL HIGH (ref 1.7–7.7)
Platelets: 300 10*3/uL (ref 150–400)
RBC: 5.7 MIL/uL (ref 4.22–5.81)
RDW: 14 % (ref 11.5–15.5)
WBC: 21.7 10*3/uL — AB (ref 4.0–10.5)

## 2015-03-15 LAB — ACETAMINOPHEN LEVEL: Acetaminophen (Tylenol), Serum: <10 ug/mL — ABNORMAL LOW (ref 10–30)

## 2015-03-15 LAB — COMPREHENSIVE METABOLIC PANEL
ALBUMIN: 4.1 g/dL (ref 3.5–5.0)
ALT: 24 U/L (ref 17–63)
AST: 28 U/L (ref 15–41)
Alkaline Phosphatase: 77 U/L (ref 38–126)
Anion gap: 11 (ref 5–15)
BUN: 12 mg/dL (ref 6–20)
CALCIUM: 8.9 mg/dL (ref 8.9–10.3)
CO2: 27 mmol/L (ref 22–32)
CREATININE: 0.79 mg/dL (ref 0.61–1.24)
Chloride: 101 mmol/L (ref 101–111)
GFR calc Af Amer: >60 mL/min (ref >60)
GFR calc non Af Amer: >60 mL/min (ref >60)
GLUCOSE: 97 mg/dL (ref 65–99)
Potassium: 3.4 mmol/L — ABNORMAL LOW (ref 3.5–5.1)
SODIUM: 139 mmol/L (ref 135–145)
Total Bilirubin: 0.9 mg/dL (ref 0.3–1.2)
Total Protein: 8.3 g/dL — ABNORMAL HIGH (ref 6.5–8.1)

## 2015-03-15 LAB — RAPID URINE DRUG SCREEN, HOSP PERFORMED
Amphetamines: NOT DETECTED
Barbiturates: NOT DETECTED
Benzodiazepines: NOT DETECTED
Cocaine: NOT DETECTED
OPIATES: NOT DETECTED
Tetrahydrocannabinol: NOT DETECTED

## 2015-03-15 LAB — SALICYLATE LEVEL

## 2015-03-15 LAB — TROPONIN I

## 2015-03-15 LAB — ETHANOL

## 2015-03-15 MED ORDER — SODIUM CHLORIDE 0.9 % IV SOLN
1000.0000 mL | Freq: Once | INTRAVENOUS | Status: AC
  Administered 2015-03-15: 1000 mL via INTRAVENOUS

## 2015-03-15 MED ORDER — ALUM & MAG HYDROXIDE-SIMETH 200-200-20 MG/5ML PO SUSP: 30.0000 mL | ORAL | Status: DC | PRN

## 2015-03-15 MED ORDER — ACETAMINOPHEN 325 MG PO TABS: 650.0000 mg | ORAL_TABLET | ORAL | Status: DC | PRN

## 2015-03-15 MED ORDER — SODIUM CHLORIDE 0.9 % IV BOLUS (SEPSIS)
1000.0000 mL | Freq: Once | INTRAVENOUS | Status: AC
  Administered 2015-03-15: 1000 mL via INTRAVENOUS

## 2015-03-15 MED ORDER — ONDANSETRON HCL 4 MG PO TABS: 4.0000 mg | ORAL_TABLET | Freq: Three times a day (TID) | ORAL | Status: DC | PRN

## 2015-03-15 MED ORDER — LORAZEPAM 1 MG PO TABS
1.0000 mg | ORAL_TABLET | Freq: Once | ORAL | Status: DC
  Filled 2015-03-15: qty 1

## 2015-03-15 MED ORDER — HALOPERIDOL LACTATE 5 MG/ML IJ SOLN
2.0000 mg | Freq: Once | INTRAMUSCULAR | Status: AC
  Administered 2015-03-15: 2 mg via INTRAVENOUS
  Filled 2015-03-15: qty 1

## 2015-03-15 NOTE — ED Notes (Signed)
Waiting on nurse to see if she will recollect blood.

## 2015-03-15 NOTE — ED Notes (Signed)
Patient continues to stare straight ahead. Patient talking out loud, no one present at bedside at this time.

## 2015-03-15 NOTE — ED Notes (Signed)
Patient reminded we need a urine sample as soon as he is able to provide it.

## 2015-03-15 NOTE — ED Notes (Signed)
Pt has in belonging bag:  Grey baseball hat, [place] shoes, two packs of cigs, brown belt, (1) Five dollar bill, (2) one dollar bill, blue lighter, blue jeans, Babiarz socks, Bollard t-shirt, light blue plaid button up shirt.
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
## 2015-03-15 NOTE — ED Notes (Signed)
Called nurse twice on radio to see if she would like for me to recollect,  No answer from nurse.  Notified CN

## 2015-03-15 NOTE — ED Notes (Signed)
PT arrives to the ER under IVC with [facility]; per IVC pt has been hearing voices and seeing things; pt believes that the police are out to get him; per IVC pt has been hostile and aggressive toward other people; pt c/o heartburn; pt states that he has had heartburn since he was 34yrs old; pt states "It's bad today"; pt slow to respond to questions; pt denies chest pain; pt is dusky in color and diaphoretic; pt HR in the 140's
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
## 2015-03-16 ENCOUNTER — Emergency Department (HOSPITAL_COMMUNITY): Payer: Self-pay

## 2015-03-16 ENCOUNTER — Encounter (HOSPITAL_COMMUNITY): Payer: Self-pay

## 2015-03-16 ENCOUNTER — Inpatient Hospital Stay (HOSPITAL_COMMUNITY)
Admission: AD | Admit: 2015-03-16 | Discharge: 2015-03-21 | DRG: 897 | Disposition: A | Discharge status: Home / Self Care | Payer: Federal, State, Local not specified - Other | Attending: Psychiatry | Admitting: Psychiatry

## 2015-03-16 DIAGNOSIS — F22 Delusional disorders: Secondary | ICD-10-CM | POA: Diagnosis not present

## 2015-03-16 DIAGNOSIS — L409 Psoriasis, unspecified: Secondary | ICD-10-CM | POA: Diagnosis present

## 2015-03-16 DIAGNOSIS — F19922 Other psychoactive substance use, unspecified with intoxication with perceptual disturbance: Secondary | ICD-10-CM | POA: Diagnosis present

## 2015-03-16 DIAGNOSIS — F1290 Cannabis use, unspecified, uncomplicated: Secondary | ICD-10-CM | POA: Diagnosis present

## 2015-03-16 DIAGNOSIS — F19251 Other psychoactive substance dependence with psychoactive substance-induced psychotic disorder with hallucinations: Secondary | ICD-10-CM | POA: Diagnosis present

## 2015-03-16 DIAGNOSIS — Z79899 Other long term (current) drug therapy: Secondary | ICD-10-CM | POA: Diagnosis not present

## 2015-03-16 DIAGNOSIS — F1590 Other stimulant use, unspecified, uncomplicated: Secondary | ICD-10-CM | POA: Diagnosis present

## 2015-03-16 DIAGNOSIS — F1320 Sedative, hypnotic or anxiolytic dependence, uncomplicated: Secondary | ICD-10-CM | POA: Diagnosis present

## 2015-03-16 DIAGNOSIS — F1190 Opioid use, unspecified, uncomplicated: Secondary | ICD-10-CM | POA: Diagnosis present

## 2015-03-16 DIAGNOSIS — F1120 Opioid dependence, uncomplicated: Secondary | ICD-10-CM | POA: Diagnosis present

## 2015-03-16 DIAGNOSIS — F1221 Cannabis dependence, in remission: Secondary | ICD-10-CM | POA: Diagnosis present

## 2015-03-16 DIAGNOSIS — E221 Hyperprolactinemia: Secondary | ICD-10-CM | POA: Clinically undetermined

## 2015-03-16 DIAGNOSIS — F1220 Cannabis dependence, uncomplicated: Secondary | ICD-10-CM | POA: Diagnosis present

## 2015-03-16 DIAGNOSIS — K219 Gastro-esophageal reflux disease without esophagitis: Secondary | ICD-10-CM | POA: Diagnosis present

## 2015-03-16 DIAGNOSIS — F19929 Other psychoactive substance use, unspecified with intoxication, unspecified: Secondary | ICD-10-CM | POA: Diagnosis present

## 2015-03-16 DIAGNOSIS — R4689 Other symptoms and signs involving appearance and behavior: Secondary | ICD-10-CM | POA: Insufficient documentation

## 2015-03-16 DIAGNOSIS — R45851 Suicidal ideations: Secondary | ICD-10-CM | POA: Diagnosis not present

## 2015-03-16 DIAGNOSIS — F29 Unspecified psychosis not due to a substance or known physiological condition: Secondary | ICD-10-CM | POA: Diagnosis present

## 2015-03-16 DIAGNOSIS — F19951 Other psychoactive substance use, unspecified with psychoactive substance-induced psychotic disorder with hallucinations: Secondary | ICD-10-CM | POA: Diagnosis present

## 2015-03-16 MED ORDER — HALOPERIDOL 2 MG PO TABS
2.0000 mg | ORAL_TABLET | Freq: Two times a day (BID) | ORAL | Status: DC
Start: 2015-03-16 — End: 2015-03-16
  Administered 2015-03-16: 2 mg via ORAL
  Filled 2015-03-16: qty 1

## 2015-03-16 MED ORDER — HALOPERIDOL LACTATE 5 MG/ML IJ SOLN
5.0000 mg | Freq: Once | INTRAMUSCULAR | Status: AC
  Administered 2015-03-16: 5 mg via INTRAVENOUS
  Filled 2015-03-16: qty 1

## 2015-03-16 MED ORDER — METHADONE HCL 10 MG PO TABS
60.0000 mg | ORAL_TABLET | Freq: Every day | ORAL | Status: DC
  Administered 2015-03-16: 60 mg via ORAL
  Filled 2015-03-16: qty 6

## 2015-03-16 MED ORDER — METHADONE HCL 10 MG PO TABS
60.0000 mg | ORAL_TABLET | Freq: Every day | ORAL | Status: DC
  Administered 2015-03-17 – 2015-03-21 (×5): 60 mg via ORAL
  Filled 2015-03-16 (×6): qty 6

## 2015-03-16 MED ORDER — TRAZODONE HCL 100 MG PO TABS
100.0000 mg | ORAL_TABLET | Freq: Every evening | ORAL | Status: DC | PRN
  Administered 2015-03-18 – 2015-03-20 (×3): 100 mg via ORAL
  Filled 2015-03-16: qty 7
  Filled 2015-03-16 (×3): qty 1

## 2015-03-16 MED ORDER — TRAZODONE HCL 100 MG PO TABS: 100.0000 mg | ORAL_TABLET | Freq: Every evening | ORAL | Status: DC | PRN

## 2015-03-16 MED ORDER — HALOPERIDOL 2 MG PO TABS
2.0000 mg | ORAL_TABLET | Freq: Two times a day (BID) | ORAL | Status: DC
  Administered 2015-03-16 – 2015-03-21 (×10): 2 mg via ORAL
  Filled 2015-03-16: qty 14
  Filled 2015-03-16 (×2): qty 1
  Filled 2015-03-16: qty 14
  Filled 2015-03-16 (×8): qty 1
  Filled 2015-03-16: qty 14
  Filled 2015-03-16 (×3): qty 1
  Filled 2015-03-16: qty 14

## 2015-03-16 MED ORDER — HYDROXYZINE HCL 25 MG PO TABS
25.0000 mg | ORAL_TABLET | Freq: Three times a day (TID) | ORAL | Status: DC | PRN
  Filled 2015-03-16: qty 10

## 2015-03-16 MED ORDER — METOPROLOL TARTRATE 1 MG/ML IV SOLN
2.5000 mg | Freq: Once | INTRAVENOUS | Status: AC
  Administered 2015-03-16: 2.5 mg via INTRAVENOUS
  Filled 2015-03-16: qty 5

## 2015-03-16 MED ORDER — SODIUM CHLORIDE 0.9 % IV BOLUS (SEPSIS)
1000.0000 mL | Freq: Once | INTRAVENOUS | Status: AC
  Administered 2015-03-16: 1000 mL via INTRAVENOUS

## 2015-03-16 MED ORDER — HYDROXYZINE HCL 25 MG PO TABS: 25.0000 mg | ORAL_TABLET | Freq: Three times a day (TID) | ORAL | Status: DC | PRN

## 2015-03-16 MED ORDER — LORAZEPAM 2 MG/ML IJ SOLN
1.0000 mg | Freq: Once | INTRAMUSCULAR | Status: AC
  Administered 2015-03-16: 1 mg via INTRAVENOUS
  Filled 2015-03-16: qty 1

## 2015-03-16 MED ORDER — HALOPERIDOL LACTATE 5 MG/ML IJ SOLN
3.0000 mg | Freq: Once | INTRAMUSCULAR | Status: AC
  Administered 2015-03-16: 3 mg via INTRAVENOUS
  Filled 2015-03-16: qty 1

## 2015-03-16 MED ORDER — HALOPERIDOL LACTATE 5 MG/ML IJ SOLN
INTRAMUSCULAR | Status: AC
  Administered 2015-03-16: 3 mg via INTRAVENOUS
  Filled 2015-03-16: qty 1
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
## 2015-03-16 NOTE — BH Assessment (Signed)
Tele Assessment Note   [name] is an 34 y.o. male.  -Clinician reviewed note by Dr. Donnald Garre.  Patient was brought in on IVC taken out by father.  Patient has been acting aggressively at home.  Patient believes that the police are coming for him.  Pt will come out of his room at he house ranting about people that are out to get him.    Patient is awake but has a delay in responses to questions.  He looks around as if seeing something.  Clinician asked if he has visual hallucinations and patient says no.  Patient denies SI, HI or visual hallucinations.  He does say he hears voices but does not say what he is hearing.  Patient looks apprehensive and says "I'll just kill myself."  Patient does not say how, says "I don't know."  Patient speech is pressured.  He does say he gets  of methadone from [facility].  His last dose was Sunday.  Patients' BAL is <5 and also has a clear UDS.    -Clinician discussed patient care with Donell Sievert, PA who recommends patient be seen by psychiatry in AM to uphold or rescind IVC.  Dr. Nicanor Alcon informed of disposition.  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 Abscess   . Heroin addiction (HCC)   . GERD (gastroesophageal reflux disease)     History reviewed. No pertinent past surgical history.  Family History: No family history on file.  Social History:  reports that he has been smoking.  He has never used smokeless tobacco. He reports that he drinks alcohol. He reports that he does not use illicit drugs.  Additional Social History:  Alcohol / Drug Use Pain Medications: See PTA medication list Prescriptions: See PTA medication list Over the Counter: See PTA medication list History of alcohol / drug  use?: Yes Substance #1 Name of Substance 1: ETOH 1 - Age of First Use: 34 years of age 35 - Amount (size/oz): Varies 1 - Frequency: Varies 1 - Duration: on-going 1 - Last Use / Amount: Unknown, pt does not answer Substance #3 Name of Substance 3: Heroin 3 - Age of First Use: Unknown 3 - Amount (size/oz): Unknown 3 - Frequency: Unknown 3 - Duration: Denies current use 3 - Last Use / Amount: Unknown  CIWA: CIWA-Ar BP: 164/88 mmHg Pulse Rate: (!) 128 COWS:    PATIENT STRENGTHS: (choose at least two) Average or above average intelligence Communication skills Supportive family/friends  Allergies: No Known Allergies  Home Medications:  (Not in a hospital admission)  OB/GYN Status:  No LMP for male patient.  General Assessment Data Location of Assessment: [facility] TTS Assessment: In system Is this a Tele or Face-to-Face Assessment?: Face-to-Face Is this an Initial Assessment or a Re-assessment for this encounter?: Initial Assessment Marital status: Single Is patient pregnant?: No Pregnancy Status: No Living Arrangements: Parent (Lives with father) Can pt return to current living arrangement?: Yes Admission Status: Involuntary Is patient capable of signing voluntary admission?: No Referral Source: Self/Family/Friend Insurance type: Self pay     Crisis Care Plan Living Arrangements: Parent (Lives with father) Name of Psychiatrist: None Reported Name of Therapist: None Reported  Education Status Is patient currently in school?: No  Risk to  self with the past 6 months Suicidal Ideation: No Has patient been a risk to self within the past 6 months prior to admission? : No Suicidal Intent: No Has patient had any suicidal intent within the past 6 months prior to admission? : No Is patient at risk for suicide?: No Suicidal Plan?: No Has patient had any suicidal plan within the past 6 months prior to admission? : No Access to Means: No What has been your use of  drugs/alcohol within the last 12 months?: Methadone   Previous Attempts/Gestures: No How many times?: 0 Other Self Harm Risks: None Triggers for Past Attempts: None known Intentional Self Injurious Behavior: None Family Suicide History: No Recent stressful life event(s): Job Loss, Financial Problems Persecutory voices/beliefs?: Yes Depression: Yes Depression Symptoms: Despondent, Insomnia, Isolating, Loss of interest in usual pleasures, Feeling worthless/self pity, Feeling angry/irritable Substance abuse history and/or treatment for substance abuse?: Yes Suicide prevention information given to non-admitted patients: Not applicable  Risk to Others within the past 6 months Homicidal Ideation: No Does patient have any lifetime risk of violence toward others beyond the six months prior to admission? 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 [employer])  Mental Status Report Appearance/Hygiene: Disheveled, In scrubs Eye Contact: Poor Motor Activity: Mannerisms Speech: Incoherent, Pressured, Soft Level of Consciousness: Alert Mood: Anxious, Suspicious, Apprehensive Affect: Anxious Anxiety Level: Moderate Thought Processes: Irrelevant Judgement: Unimpaired Orientation: Situation, Person, Place Obsessive Compulsive Thoughts/Behaviors: None  Cognitive Functioning Concentration: Decreased Memory: Unable to Assess IQ: Average Insight: Poor Impulse Control: Unable to Assess Appetite:  (Unknown) Weight Loss: 0 Weight Gain: 0 Sleep:  Unable to Assess Total Hours of Sleep:  (Unknown) Vegetative Symptoms: Decreased grooming  ADLScreening [facility] Services) Patient's cognitive ability adequate to safely complete daily activities?: Yes Patient able to express need for assistance with ADLs?: Yes Independently performs ADLs?: Yes (appropriate for developmental age)  Prior Inpatient Therapy Prior Inpatient Therapy: Yes Prior Therapy Dates: 2015 Prior Therapy Facilty/Provider[facility]  Reason for Treatment: SA and Depression  Prior Outpatient Therapy Prior Outpatient Therapy: Yes Prior Therapy Dates: Ongoing  Prior Therapy Facilty/Provider(s): [facility]  Reason for Treatment: Methadone maintiance  Does patient have an ACCT team?: No Does patient have Intensive In-House Services?  : No Does patient have [facility] services? : No Does patient have P4CC services?: No  ADL Screening (condition at time of admission) Patient's cognitive ability adequate to safely complete daily activities?: Yes Is the patient deaf or have difficulty hearing?: No Does the patient have difficulty seeing, even when wearing glasses/contacts?: No Does the patient have difficulty concentrating, remembering, or making decisions?: Yes Patient able to express need for assistance with ADLs?: Yes Does the patient have difficulty dressing or bathing?: No Independently performs ADLs?: Yes (appropriate for developmental age) Does the patient have difficulty walking or climbing stairs?: No Weakness of Legs: None Weakness of Arms/Hands: None       Abuse/Neglect Assessment (Assessment to be complete while patient is alone) Physical Abuse: Denies Verbal Abuse: Denies Sexual Abuse: Denies Exploitation of patient/patient's resources: Denies Self-Neglect: Denies     Advance Directives (For  Healthcare) Does patient have an advance directive?: No Would patient like information on creating an advanced directive?: No - patient declined  information    Additional Information 1:1 In Past 12 Months?: No CIRT Risk: No Elopement Risk: No Does patient have medical clearance?: Yes     Disposition:  Disposition Initial Assessment Completed for this Encounter: Yes Disposition of Patient: Other dispositions Other disposition(s): Other (Comment) (To be reviewed with PA)  Beatriz Stallion Ray 03/16/2015 2:33 AM

## 2015-03-16 NOTE — Tx Team (Signed)
Initial Interdisciplinary Treatment Plan   PATIENT STRESSORS: Financial difficulties Medication change or noncompliance   PATIENT STRENGTHS: Physical Health Supportive family/friends   PROBLEM LIST: Problem List/Patient Goals Date to be addressed Date deferred Reason deferred Estimated date of resolution  Psychosis 03/16/15     "I want my voices to go away" 03/16/15     "I want to go back home" 03/16/15     Anxiety 03/16/15                                    DISCHARGE CRITERIA:  Ability to meet basic life and health needs Adequate post-discharge living arrangements Improved stabilization in mood, thinking, and/or behavior Reduction of life-threatening or endangering symptoms to within safe limits  PRELIMINARY DISCHARGE PLAN: Outpatient therapy Return to previous living arrangement  PATIENT/FAMIILY INVOLVEMENT: This treatment plan has been presented to and reviewed with the patient, [name].  The patient and family have been given the opportunity to ask questions and make suggestions.  Norm Parcel Honey Zakarian 03/16/2015, 4:16 PM

## 2015-03-16 NOTE — ED Notes (Signed)
Patient repeatedly moving about the bed, bending arm. Patient had been redirected multiple times to avoid manipulation at IV site. At 0405 it was noted the patient IV site had infiltrated. Patient denies pain at the site. New IV started, remaining fluids from IV bolus #4 infused at left hand IV site.

## 2015-03-16 NOTE — Progress Notes (Signed)
Admission note: [name] was transferred from [facility] ED to room 505-1.  He was brought in under IVC initiated by father reporting increase in aggression and paranoia.  He believes that the police are following him and he has been isolating.  He was pleasant, cooperative and very sleepy during assessment.  "I haven't slept much and I can hardly keep my eyes open."  He admits to hearing voices all the time, "it sounds like I am at a race or an NFL game in my head."  He denies that the voices tell him to harm himself or others.  He denies seeing visions.  He reports that he goes to [facility] meth[facility] and takes 130 mg of methadone per day.  Skin assessment completed and areas of psoriasis noted on head, face, back, chest, buttocks, legs and elbows.  He denies any other physical problems.  He appears to be in no physical distress.  Oriented to unit, admission packet signed/reviewed and belongings secured in locker # 34.  Q 15 minute checks initiated for safety.

## 2015-03-16 NOTE — ED Notes (Signed)
TTS at bedside. 

## 2015-03-16 NOTE — BH Assessment (Signed)
[facility] Assessment Progress Note  Per Thedore Mins, MD, this pt requires psychiatric hospitalization at this time.  Berneice Heinrich, RN, [facility] has assigned pt to [facility] Rm 505-1.  Pt presents under IVC initiated by his father, which Dr Jannifer Franklin has upheld.  Pt has signed Consent to Release Information to both of his parents, Jomarie Longs and Robbert Langlinais, and signed form has been faxed to [facility].  Pt's nurse, Kendal Hymen, has been notified, and agrees to send original document along with pt via law enforcement, and to call report to [phone].  Doylene Canning, MA Triage Specialist [phone]

## 2015-03-16 NOTE — ED Notes (Signed)
Pt oriented to room and unit.  Pt is irritable and non communicative.  Video monitoring and 15 minute checks in place.

## 2015-03-16 NOTE — ED Notes (Signed)
Patient given water

## 2015-03-16 NOTE — Consult Note (Signed)
[place] [facility] Consult   Reason for Consult:  Auditory Hallucination, Delusion, Agitation and Aggression towards father Referring Physician:  EDP Patient Identification: [name] MRN:  [id number] Principal Diagnosis: Delusional disorder [facility]) Diagnosis:   Patient Active Problem List   Diagnosis Date Noted  . Delusional disorder ([place]) [F22] 04/28/2014    Priority: High  . Opioid dependence with withdrawal ([place]) [F11.23]   . 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 Opioid dependence ([place]) [F11.20] 05/04/2014  . Stimulant use disorder ([place]) [F15.90] 05/04/2014  . 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 Cannabis use disorder, moderate, dependence ([place]) [F12.20] 05/04/2014  . Psychoses [F29]   . Polysubstance abuse [F19.10] 04/28/2014    Total Time spent with patient: 1 hour  Subjective:   [name] is a 34 y.o. male patient admitted with Auditory Hallucination, Delusion, Agitation and Aggression towards father.  HPI:  Caucasian male, 17 years old was evaluated for Delusion and auditory hallucinations.  Patient  Was IVC by his father  For hearing voices and seeing things.  Patient denies what is written in his IVC paper and stated that his father is a crazy man.  IVC paper stated that he has been aggressive  and hostile towards people in the house.  Patient at home stated that the Police were out looking for him.   Patient is a client at [facility] where he receives his methadone 130 mg  daily.  Patient did not go for his Methadone yesterday  and is beginning to withdraw at this time.  He is irritable, angry, fidgety  and anxious.  It was documented that his pulse rate was  Elevated yesterday but is wnl  At this time. He denies SI/HI AVH .  He has been accepted for admission and has a bed assigned.  We will transfer patient to his room as soon as transportation becomes  available.  Past Psychiatric History:  Substance induced Psychosis, Polysubstance abuse  Risk to Self: Suicidal Ideation: No Suicidal Intent: No Is patient at risk for suicide?: No Suicidal Plan?: No Access to Means: No What has been your use of drugs/alcohol within the last 12 months?: Methadone 189m  How many times?: 0 Other Self Harm Risks: None Triggers for Past Attempts: None known Intentional Self Injurious Behavior: None Risk to Others: Homicidal Ideation: No Thoughts of Harm to Others: No Current Homicidal Intent: No Current Homicidal Plan: No Access to Homicidal Means: No Identified Victim: No one History of harm to others?: No Assessment of Violence: None Noted Violent Behavior Description: None known Does patient have access to weapons?: No Criminal Charges Pending?: No Does patient have a court date: No Prior Inpatient Therapy: Prior Inpatient Therapy: Yes Prior Therapy Dates: 2015 [facility] Reason for Treatment: SA and Depression Prior Outpatient Therapy: Prior Outpatient Therapy: Yes Prior Therapy Dates: Ongoing  Prior Therapy Facilty/Provider(s): [facility]  Reason for Treatment: Methadone maintiance  Does patient have an ACCT team?: No Does patient have Intensive In-House Services?  : No Does patient have [facility] services? : No Does patient have P4CC services?: No  Past Medical History:  Past Medical History  Diagnosis Date  . Abscess   . Heroin addiction ([place]   . GERD (gastroesophageal reflux disease)    History reviewed. No pertinent past surgical history. Family History: No family history on file.   Family Psychiatric  History:   Patient stated" As far as  I know, my dad is crazy" Social History:  History  Alcohol Use  . Yes    Comment: occasionally     History  Drug Use No    Comment: recovering last use Oct 2013    Social History   Social History  . Marital Status: Single    Spouse Name: N/A  . Number  of Children: N/A  . Years of Education: N/A   Social History Main Topics  . Smoking status: Current Every Day Smoker -- 1.50 packs/day for 15 years  . Smokeless tobacco: Never Used  . Alcohol Use: Yes     Comment: occasionally  . Drug Use: No     Comment: recovering last use Oct 2013  . Sexual Activity: No   Other Topics Concern  . None   Social History Narrative   Additional Social History:    Pain Medications: See PTA medication list Prescriptions: See PTA medication list Over the Counter: See PTA medication list History of alcohol / drug use?: Yes Name of Substance 1: ETOH 1 - Age of First Use: 34 years of age 88 - Amount (size/oz): Varies 1 - Frequency: Varies 1 - Duration: on-going 1 - Last Use / Amount: Unknown, pt does not answer   Name of Substance 3: Heroin 3 - Age of First Use: Unknown 3 - Amount (size/oz): Unknown 3 - Frequency: Unknown 3 - Duration: Denies current use 3 - Last Use / Amount: Unknown     Allergies:  No Known Allergies  Labs:  Results for orders placed or performed during the hospital encounter of 03/15/15 (from the past 48 hour(s))  Ethanol     Status: None   Collection Time: 03/15/15  8:29 PM  Result Value Ref Range   Alcohol, Ethyl (B) <5 <5 mg/dL    Comment:        LOWEST DETECTABLE LIMIT FOR SERUM ALCOHOL IS 5 mg/dL FOR MEDICAL PURPOSES ONLY   Salicylate level     Status: None   Collection Time: 03/15/15  8:29 PM  Result Value Ref Range   Salicylate Lvl <6.2 2.8 - 30.0 mg/dL  Troponin I     Status: None   Collection Time: 03/15/15  8:29 PM  Result Value Ref Range   Troponin I <0.03 <0.031 ng/mL    Comment:        NO INDICATION OF MYOCARDIAL INJURY.   CBC with Differential     Status: Abnormal   Collection Time: 03/15/15  8:29 PM  Result Value Ref Range   WBC 21.7 (H) 4.0 - 10.5 K/uL   RBC 5.70 4.22 - 5.81 MIL/uL   Hemoglobin 18.1 (H) 13.0 - 17.0 g/dL   HCT 51.5 39.0 - 52.0 %   MCV 90.4 78.0 - 100.0 fL   MCH 31.8  26.0 - 34.0 pg   MCHC 35.1 30.0 - 36.0 g/dL   RDW 14.0 11.5 - 15.5 %   Platelets 300 150 - 400 K/uL   Neutrophils Relative % 75 %   Neutro Abs 16.2 (H) 1.7 - 7.7 K/uL   Lymphocytes Relative 18 %   Lymphs Abs 4.0 0.7 - 4.0 K/uL   Monocytes Relative 7 %   Monocytes Absolute 1.5 (H) 0.1 - 1.0 K/uL   Eosinophils Relative 0 %   Eosinophils Absolute 0.0 0.0 - 0.7 K/uL   Basophils Relative 0 %   Basophils Absolute 0.0 0.0 - 0.1 K/uL  Acetaminophen level     Status: Abnormal   Collection Time: 03/15/15  8:29 PM  Result Value Ref Range   Acetaminophen (Tylenol), Serum <10 (L) 10 - 30 ug/mL    Comment:        THERAPEUTIC CONCENTRATIONS VARY SIGNIFICANTLY. A RANGE OF 10-30 ug/mL MAY BE AN EFFECTIVE CONCENTRATION FOR MANY PATIENTS. HOWEVER, SOME ARE BEST TREATED AT CONCENTRATIONS OUTSIDE THIS RANGE. ACETAMINOPHEN CONCENTRATIONS >150 ug/mL AT 4 HOURS AFTER INGESTION AND >50 ug/mL AT 12 HOURS AFTER INGESTION ARE OFTEN ASSOCIATED WITH TOXIC REACTIONS.   Urinalysis, Routine w reflex microscopic     Status: Abnormal   Collection Time: 03/15/15 10:20 PM  Result Value Ref Range   Color, Urine AMBER (A) YELLOW    Comment: BIOCHEMICALS MAY BE AFFECTED BY COLOR   APPearance CLOUDY (A) CLEAR   Specific Gravity, Urine 1.033 (H) 1.005 - 1.030   pH 6.0 5.0 - 8.0   Glucose, UA NEGATIVE NEGATIVE mg/dL   Hgb urine dipstick NEGATIVE NEGATIVE   Bilirubin Urine MODERATE (A) NEGATIVE   Ketones, ur >80 (A) NEGATIVE mg/dL   Protein, ur 100 (A) NEGATIVE mg/dL   Urobilinogen, UA 1.0 0.0 - 1.0 mg/dL   Nitrite NEGATIVE NEGATIVE   Leukocytes, UA NEGATIVE NEGATIVE  Urine rapid drug screen (hosp performed)     Status: None   Collection Time: 03/15/15 10:20 PM  Result Value Ref Range   Opiates NONE DETECTED NONE DETECTED   Cocaine NONE DETECTED NONE DETECTED   Benzodiazepines NONE DETECTED NONE DETECTED   Amphetamines NONE DETECTED NONE DETECTED   Tetrahydrocannabinol NONE DETECTED NONE DETECTED    Barbiturates NONE DETECTED NONE DETECTED    Comment:        DRUG SCREEN FOR MEDICAL PURPOSES ONLY.  IF CONFIRMATION IS NEEDED FOR ANY PURPOSE, NOTIFY LAB WITHIN 5 DAYS.        LOWEST DETECTABLE LIMITS FOR URINE DRUG SCREEN Drug Class       Cutoff (ng/mL) Amphetamine      1000 Barbiturate      200 Benzodiazepine   944 Tricyclics       967 Opiates          300 Cocaine          300 THC              50   Urine microscopic-add on     Status: Abnormal   Collection Time: 03/15/15 10:20 PM  Result Value Ref Range   WBC, UA 3-6 <3 WBC/hpf   RBC / HPF 0-2 <3 RBC/hpf   Bacteria, UA FEW (A) RARE   Casts GRANULAR CAST (A) NEGATIVE   Urine-Other MUCOUS PRESENT   Comprehensive metabolic panel     Status: Abnormal   Collection Time: 03/15/15 11:01 PM  Result Value Ref Range   Sodium 139 135 - 145 mmol/L   Potassium 3.4 (L) 3.5 - 5.1 mmol/L   Chloride 101 101 - 111 mmol/L   CO2 27 22 - 32 mmol/L   Glucose, Bld 97 65 - 99 mg/dL   BUN 12 6 - 20 mg/dL   Creatinine, Ser 0.79 0.61 - 1.24 mg/dL   Calcium 8.9 8.9 - 10.3 mg/dL   Total Protein 8.3 (H) 6.5 - 8.1 g/dL   Albumin 4.1 3.5 - 5.0 g/dL   AST 28 15 - 41 U/L   ALT 24 17 - 63 U/L   Alkaline Phosphatase 77 38 - 126 U/L   Total Bilirubin 0.9 0.3 - 1.2 mg/dL   GFR calc non Af Amer >60 >60 mL/min   GFR calc Af  Amer >60 >60 mL/min    Comment: (NOTE) The eGFR has been calculated using the CKD EPI equation. This calculation has not been validated in all clinical situations. eGFR's persistently <60 mL/min signify possible Chronic Kidney Disease.    Anion gap 11 5 - 15  CBC     Status: Abnormal   Collection Time: 03/15/15 11:25 PM  Result Value Ref Range   WBC 19.9 (H) 4.0 - 10.5 K/uL   RBC 5.21 4.22 - 5.81 MIL/uL   Hemoglobin 16.4 13.0 - 17.0 g/dL   HCT 47.4 39.0 - 52.0 %   MCV 91.0 78.0 - 100.0 fL   MCH 31.5 26.0 - 34.0 pg   MCHC 34.6 30.0 - 36.0 g/dL   RDW 14.0 11.5 - 15.5 %   Platelets 241 150 - 400 K/uL    Current  Facility-Administered Medications  Medication Dose Route Frequency Provider Last Rate Last Dose  . acetaminophen (TYLENOL) tablet 650 mg  650 mg Oral Q4H PRN Charlesetta Shanks, MD      . alum & mag hydroxide-simeth (MAALOX/MYLANTA) 200-200-20 MG/5ML suspension 30 mL  30 mL Oral PRN Charlesetta Shanks, MD      . haloperidol (HALDOL) tablet 2 mg  2 mg Oral BID Clark Cuff   2 mg at 03/16/15 1106  . hydrOXYzine (ATARAX/VISTARIL) tablet 25 mg  25 mg Oral TID PRN Issabella Rix      . methadone (DOLOPHINE) tablet 60 mg  60 mg Oral Daily Kasin Tonkinson   60 mg at 03/16/15 1107  . ondansetron (ZOFRAN) tablet 4 mg  4 mg Oral Q8H PRN Charlesetta Shanks, MD      . traZODone (DESYREL) tablet 100 mg  100 mg Oral QHS PRN Jontae Adebayo       Current Outpatient Prescriptions  Medication Sig Dispense Refill  . doxylamine, Sleep, (UNISOM) 25 MG tablet Take 250 mg by mouth at bedtime as needed for sleep.    . methadone (DOLOPHINE) 10 MG/ML solution Take 130 mg by mouth daily.    . benztropine (COGENTIN) 0.5 MG tablet Take 1 tablet (0.5 mg total) by mouth 2 (two) times daily. (Patient not taking: Reported on 03/12/2015) 60 tablet 0  . haloperidol (HALDOL) 10 MG tablet Take 1 tablet (10 mg total) by mouth 2 (two) times daily. (Patient not taking: Reported on 03/12/2015) 60 tablet 0  . hydrOXYzine (ATARAX/VISTARIL) 25 MG tablet Take 1 tablet (25 mg total) by mouth every 6 (six) hours as needed for anxiety. (Patient not taking: Reported on 03/12/2015) 30 tablet 0  . methocarbamol (ROBAXIN) 500 MG tablet Take 1 tablet (500 mg total) by mouth every 8 (eight) hours as needed for muscle spasms. (Patient not taking: Reported on 03/12/2015) 30 tablet 0  . nicotine (NICODERM CQ - DOSED IN MG/24 HOURS) 21 mg/24hr patch Place 1 patch (21 mg total) onto the skin daily. (Patient not taking: Reported on 03/12/2015) 28 patch 0  . traZODone (DESYREL) 100 MG tablet Take 1 tablet (100 mg total) by mouth at bedtime. (Patient not taking:  Reported on 03/12/2015) 30 tablet 0  . triamcinolone cream (KENALOG) 0.5 % Apply topically 2 (two) times daily. (Patient not taking: Reported on 03/12/2015) 30 g 0    Musculoskeletal: Strength & Muscle Tone: within normal limits Gait & Station: normal Patient leans: N/A  Psychiatric Specialty Exam: Review of Systems  Constitutional: Negative.   HENT: Negative.   Eyes: Negative.   Respiratory: Negative.   Cardiovascular: Negative.   Gastrointestinal: Negative.   Genitourinary:  Negative.   Musculoskeletal: Negative.   Skin: Negative.   Neurological: Negative.   Endo/Heme/Allergies: Negative.     Blood pressure 128/81, pulse 107, temperature 98.2 F (36.8 C), temperature source Oral, resp. rate 16, height 5' 10"  (1.778 m), weight 117.935 kg (260 lb), SpO2 97 %.Body mass index is 37.31 kg/(m^2).  General Appearance: Casual and Disheveled  Eye Contact::  Good  Speech:  Garbled and Slow  Volume:  Normal  Mood:  Anxious and Irritable  Affect:  Congruent  Thought Process:  Disorganized and Tangential  Orientation:  Full (Time, Place, and Person)  Thought Content:  Delusions and Hallucinations: Auditory  Suicidal Thoughts:  No  Homicidal Thoughts:  No  Memory:  Immediate;   Poor Recent;   Poor Remote;   Poor  Judgement:  Poor  Insight:  Shallow  Psychomotor Activity:  Normal  Concentration:  Fair  Recall:  NA  Fund of Knowledge:Poor  Language: Fair  Akathisia:  NA  Handed:  Right  AIMS (if indicated):     Assets:  Desire for Improvement  ADL's:  Impaired  Cognition: Impaired,  Moderate  Sleep:      Treatment Plan Summary: Daily contact with patient to assess and evaluate symptoms and progress in treatment and Medication management  Disposition: Accepted for admission and has a bed assigned, Resume all home medications.  Patient is waiting for transportation  Delfin Gant   PMHNP-BC 03/16/2015 1:59 PM Patient seen face-to-face for psychiatric evaluation, chart  reviewed and case discussed with the physician extender and developed treatment plan. Reviewed the information documented and agree with the treatment plan. Corena Pilgrim, MD

## 2015-03-17 ENCOUNTER — Encounter (HOSPITAL_COMMUNITY): Payer: Self-pay | Admitting: Psychiatry

## 2015-03-17 DIAGNOSIS — F19929 Other psychoactive substance use, unspecified with intoxication, unspecified: Secondary | ICD-10-CM | POA: Diagnosis present

## 2015-03-17 DIAGNOSIS — L409 Psoriasis, unspecified: Secondary | ICD-10-CM | POA: Diagnosis present

## 2015-03-17 DIAGNOSIS — F19922 Other psychoactive substance use, unspecified with intoxication with perceptual disturbance: Secondary | ICD-10-CM | POA: Diagnosis present

## 2015-03-17 DIAGNOSIS — R45851 Suicidal ideations: Secondary | ICD-10-CM

## 2015-03-17 DIAGNOSIS — F1120 Opioid dependence, uncomplicated: Secondary | ICD-10-CM | POA: Diagnosis present

## 2015-03-17 DIAGNOSIS — F19251 Other psychoactive substance dependence with psychoactive substance-induced psychotic disorder with hallucinations: Principal | ICD-10-CM

## 2015-03-17 DIAGNOSIS — F1221 Cannabis dependence, in remission: Secondary | ICD-10-CM | POA: Diagnosis present

## 2015-03-17 DIAGNOSIS — F1190 Opioid use, unspecified, uncomplicated: Secondary | ICD-10-CM

## 2015-03-17 DIAGNOSIS — F19951 Other psychoactive substance use, unspecified with psychoactive substance-induced psychotic disorder with hallucinations: Secondary | ICD-10-CM

## 2015-03-17 DIAGNOSIS — F1290 Cannabis use, unspecified, uncomplicated: Secondary | ICD-10-CM

## 2015-03-17 MED ORDER — TRIAMCINOLONE ACETONIDE 0.1 % EX CREA
TOPICAL_CREAM | Freq: Two times a day (BID) | CUTANEOUS | Status: DC
  Administered 2015-03-17 – 2015-03-21 (×8): via TOPICAL
  Filled 2015-03-17: qty 15

## 2015-03-17 MED ORDER — BENZTROPINE MESYLATE 0.5 MG PO TABS
0.5000 mg | ORAL_TABLET | Freq: Two times a day (BID) | ORAL | Status: DC
  Administered 2015-03-17 – 2015-03-18 (×3): 0.5 mg via ORAL
  Filled 2015-03-17 (×7): qty 1

## 2015-03-17 MED ORDER — NICOTINE 21 MG/24HR TD PT24
21.0000 mg | MEDICATED_PATCH | Freq: Every day | TRANSDERMAL | Status: DC
  Administered 2015-03-17 – 2015-03-21 (×5): 21 mg via TRANSDERMAL
  Filled 2015-03-17 (×7): qty 1

## 2015-03-17 MED ORDER — POTASSIUM CHLORIDE CRYS ER 10 MEQ PO TBCR
10.0000 meq | EXTENDED_RELEASE_TABLET | Freq: Two times a day (BID) | ORAL | Status: AC
  Administered 2015-03-17 – 2015-03-18 (×2): 10 meq via ORAL
  Filled 2015-03-17 (×2): qty 1

## 2015-03-17 NOTE — Plan of Care (Signed)
Problem: Alteration in thought process Goal: STG-Patient is able to follow short directions Outcome: Progressing Patient is able to follow directions appropriately.      

## 2015-03-17 NOTE — Progress Notes (Signed)
D: Pt is isolative and withdrawn to room. Pt denies anxiety, depression, SI, HI and visual hallucinations. Pt however endorses auditory hallucinations; He states, "I hear Obama saying you are dump, look at your life." Pt is very labile going from having a normal conversation with the nurse to shouting "leave my room now" to smile and letting the nurse know who helpful he has been. Pt continues to be nonviolent. Pt had no schedule medication.  A: Support, encouragement, and safe environment provided.  15-minute safety checks continue.  R: Pt was med compliant.  Pt did attend wrap-up group. Safety checks continue

## 2015-03-17 NOTE — [facility] Suicide Risk Assessment (Signed)
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 Opioid use disorder, moderate, on maintenance therapy [F11.90] 03/17/2015  . Aggressive behavior of adult [F60.89]   . Opioid dependence with withdrawal (HCC) [F11.23]   . Stimulant use disorder (HCC) [F15.90] 05/04/2014  . 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 Cannabis use disorder, moderate, dependence (HCC) [F12.20] 05/04/2014  . Psychoses [F29]   . Polysubstance abuse [F19.10] 04/28/2014     Continued Clinical Symptoms:  Alcohol Use Disorder Identification Test Final Score (AUDIT): 0 The "Alcohol Use Disorders Identification Test", Guidelines for Use in Primary Care, Second Edition.  World Science writerHealth Organization [facility](WHO). Score between 0-7:  no or low risk or alcohol related problems. Score between 8-15:  moderate risk of alcohol related problems. Score between 16-19:  high risk of alcohol related problems. Score 20 or above:  warrants further diagnostic evaluation for alcohol dependence and treatment.   CLINICAL FACTORS:   Alcohol/Substance Abuse/Dependencies Previous Psychiatric Diagnoses and Treatments   Musculoskeletal: Strength & Muscle Tone: within normal limits Gait & Station: normal Patient leans: N/A  Psychiatric Specialty Exam: Physical Exam  ROS  Blood pressure 127/77, pulse 107, temperature 97.3 F (36.3 C), temperature source Oral, resp. rate 18, height 5\' 10"  (1.778 m), weight 117.935 kg (260 lb), SpO2 94  %.Body mass index is 37.31 kg/(m^2).                      Please see H&P.                                    COGNITIVE FEATURES THAT CONTRIBUTE TO RISK:  Closed-mindedness, Polarized thinking and Thought constriction (tunnel vision)    SUICIDE RISK:   Mild:  Suicidal ideation of limited frequency, intensity, duration, and specificity.  There are no identifiable plans, no associated intent, mild dysphoria and related symptoms, good self-control (both objective and subjective assessment), few other risk factors, and identifiable protective factors, including available and accessible social support.  PLAN OF CARE: Please see H&P.   Medical Decision Making:  Review of Psycho-Social Stressors (1), Review or order clinical lab tests (1), Review and summation of old records (2), Review of Last Therapy Session (1), Review of Medication Regimen & Side Effects (2) and Review of New Medication or Change in Dosage (2)  I certify that inpatient services furnished can reasonably be expected to improve the patient's condition.   Kaliyan Osbourn MD 03/17/2015, 11:38 AM

## 2015-03-17 NOTE — [facility] Group Notes (Signed)
[facility]  03/17/2015 , 2:16 PM    Type of Therapy:  [employer] Presentation  Participation Level:  Active  Participation Quality:  Attentive  Affect:  Blunted  Cognitive:  Oriented  Insight:  Limited  Engagement in Therapy:  Engaged  Modes of Intervention:  Discussion, Education and Socialization  Summary of Progress/Problems:  Onalee HuaDavid from [employer] came to present his recovery story and play the guitar.  Sat quietly.  Appeared to be sleeping much of the time.  Awoke for guitar playing, and expressed appreciation for it.  Daryel Geraldorth, Keziyah Kneale B 03/17/2015 , 2:16 PM

## 2015-03-17 NOTE — H&P (Signed)
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 Opioid use disorder, moderate, on maintenance therapy [F11.90] 03/17/2015  . Cannabis use disorder, moderate, in sustained remission [F12.90] 03/17/2015  . Psoriasis [L40.9] 03/17/2015  . Aggressive behavior of adult [F60.89]   . Opioid dependence with withdrawal ([place]) [F11.23]   . Psychoses [F29]   . Polysubstance abuse [F19.10] 04/28/2014     History of Present Illness:[name] is a 64 y old CM with hx of substance use disorder as well as psychosis , presented IVC ed taken out by father. Patient per initial notes in ED has been acting aggressively at home. Patient believed that the police are coming for him. Pt was withdrawn to his room due to paranoia , and was ranting about people out to get him."  Patient seen and chart reviewed.Discussed patient with treatment team. Pt presents today as cooperative , calm. Pt reports that he had been abusing a lot of Unisom ( Diphenhydramine) at home . Pt reports using 1 bottle a day . Pt reports having sleep issues and reports he had been trying to treat himself with that. Pt reports that he has been depressed since the past 7 months or so. His ex girlfriend and himself separated 7 months ago and that has been distressing to him. Pt reports that he has been having anhedonia , crying spells as well as psychosis. He has AH of his dad talking to him through the phone. He also felt paranoid as described above. Pt also had SI , on and off.  Pt  with hx of opioid use disorder, currently on Methadone maintenance. Pt also with hx of stimulant use disorder, bzd use disorder , reports he has been sober for a long time . His UDS is negative for bzd, opioids, thc, barbiturates, cocaine, stimulants.    Associated Signs/Symptoms: Depression Symptoms:  depressed mood, anhedonia, insomnia, fatigue, feelings of worthlessness/guilt, difficulty concentrating, hopelessness, recurrent thoughts of death, suicidal thoughts without plan, anxiety, loss of energy/fatigue, disturbed sleep, (Hypo) Manic Symptoms:  denies Anxiety Symptoms:  unspecified anxiety sx Psychotic Symptoms:  Delusions, Hallucinations: Auditory Paranoia, PTSD Symptoms: Negative Total Time spent with patient: 45 minutes  Past Psychiatric History: Patient with hx of substance abuse/  induced psychosis, admitted to [facility] 04/27/14- 05/07/14 . [facility]. Pt is on Methadone maintenance   Risk to Self: Is patient at risk for suicide?: No Risk to Others:   Prior Inpatient Therapy:   Prior Outpatient Therapy:    Alcohol Screening: Patient refused Alcohol Screening Tool: Yes 1. How often do you have a drink containing alcohol?: Never 9. Have you or someone else been injured as a result of your drinking?: No 10. Has a relative or friend or a doctor or another health worker been concerned about your drinking or suggested you cut down?: No Alcohol Use Disorder Identification Test Final Score (AUDIT): 0 Substance Abuse History in the last 12 months:  Yes.   diphenhydramine  Consequences of Substance Abuse: Medical Consequences:  recent admission Previous Psychotropic Medications: Yes, haldol , ambien  Psychological Evaluations: No  Past Medical  History:  Past Medical History  Diagnosis Date  . Abscess   . Heroin addiction ([place])   . GERD (gastroesophageal reflux disease)    Family History:  Family History  Problem Relation Age of Onset  . Mental  illness Neg Hx   . Hypertension Neg Hx    Family Psychiatric  History: Patient denies hx of mental illness, substance abuse , suicide in family.  Social History: Patient is unemployed. Lives with his father. History  Alcohol Use  . Yes    Comment: Denies any alcohol     History  Drug Use  . Yes  . Special: Other-see comments    Comment: abuses Unisom, hx of cannabis abuse, bzd abuse , stimulant abuse    Social History   Social History  . Marital Status: Single    Spouse Name: N/A  . Number of Children: N/A  . Years of Education: N/A   Social History Main Topics  . Smoking status: Current Every Day Smoker -- 1.50 packs/day for 15 years  . Smokeless tobacco: Never Used  . Alcohol Use: Yes     Comment: Denies any alcohol  . Drug Use: Yes    Special: Other-see comments     Comment: abuses Unisom, hx of cannabis abuse, bzd abuse , stimulant abuse  . Sexual Activity: No   Other Topics Concern  . None   Social History Narrative   Additional Social History:                         Allergies:  No Known Allergies Lab Results:  Results for orders placed or performed during the hospital encounter of 03/15/15 (from the past 48 hour(s))  Ethanol     Status: None   Collection Time: 03/15/15  8:29 PM  Result Value Ref Range   Alcohol, Ethyl (B) <5 <5 mg/dL    Comment:        LOWEST DETECTABLE LIMIT FOR SERUM ALCOHOL IS 5 mg/dL FOR MEDICAL PURPOSES ONLY   Salicylate level     Status: None   Collection Time: 03/15/15  8:29 PM  Result Value Ref Range   Salicylate Lvl <7.8 2.8 - 30.0 mg/dL  Troponin I     Status: None   Collection Time: 03/15/15  8:29 PM  Result Value Ref Range   Troponin I <0.03 <0.031 ng/mL    Comment:        NO INDICATION OF MYOCARDIAL INJURY.   CBC with Differential     Status: Abnormal   Collection Time: 03/15/15  8:29 PM  Result Value Ref Range   WBC 21.7 (H) 4.0 - 10.5 K/uL   RBC 5.70 4.22 - 5.81 MIL/uL   Hemoglobin 18.1 (H) 13.0 -  17.0 g/dL   HCT 51.5 39.0 - 52.0 %   MCV 90.4 78.0 - 100.0 fL   MCH 31.8 26.0 - 34.0 pg   MCHC 35.1 30.0 - 36.0 g/dL   RDW 14.0 11.5 - 15.5 %   Platelets 300 150 - 400 K/uL   Neutrophils Relative % 75 %   Neutro Abs 16.2 (H) 1.7 - 7.7 K/uL   Lymphocytes Relative 18 %   Lymphs Abs 4.0 0.7 - 4.0 K/uL   Monocytes Relative 7 %   Monocytes Absolute 1.5 (H) 0.1 - 1.0 K/uL   Eosinophils Relative 0 %   Eosinophils Absolute 0.0 0.0 - 0.7 K/uL   Basophils Relative 0 %   Basophils Absolute 0.0 0.0 -  0.1 K/uL  Acetaminophen level     Status: Abnormal   Collection Time: 03/15/15  8:29 PM  Result Value Ref Range   Acetaminophen (Tylenol), Serum <10 (L) 10 - 30 ug/mL    Comment:        THERAPEUTIC CONCENTRATIONS VARY SIGNIFICANTLY. A RANGE OF 10-30 ug/mL MAY BE AN EFFECTIVE CONCENTRATION FOR MANY PATIENTS. HOWEVER, SOME ARE BEST TREATED AT CONCENTRATIONS OUTSIDE THIS RANGE. ACETAMINOPHEN CONCENTRATIONS >150 ug/mL AT 4 HOURS AFTER INGESTION AND >50 ug/mL AT 12 HOURS AFTER INGESTION ARE OFTEN ASSOCIATED WITH TOXIC REACTIONS.   Urinalysis, Routine w reflex microscopic     Status: Abnormal   Collection Time: 03/15/15 10:20 PM  Result Value Ref Range   Color, Urine AMBER (A) YELLOW    Comment: BIOCHEMICALS MAY BE AFFECTED BY COLOR   APPearance CLOUDY (A) CLEAR   Specific Gravity, Urine 1.033 (H) 1.005 - 1.030   pH 6.0 5.0 - 8.0   Glucose, UA NEGATIVE NEGATIVE mg/dL   Hgb urine dipstick NEGATIVE NEGATIVE   Bilirubin Urine MODERATE (A) NEGATIVE   Ketones, ur >80 (A) NEGATIVE mg/dL   Protein, ur 100 (A) NEGATIVE mg/dL   Urobilinogen, UA 1.0 0.0 - 1.0 mg/dL   Nitrite NEGATIVE NEGATIVE   Leukocytes, UA NEGATIVE NEGATIVE  Urine rapid drug screen (hosp performed)     Status: None   Collection Time: 03/15/15 10:20 PM  Result Value Ref Range   Opiates NONE DETECTED NONE DETECTED   Cocaine NONE DETECTED NONE DETECTED   Benzodiazepines NONE DETECTED NONE DETECTED   Amphetamines NONE  DETECTED NONE DETECTED   Tetrahydrocannabinol NONE DETECTED NONE DETECTED   Barbiturates NONE DETECTED NONE DETECTED    Comment:        DRUG SCREEN FOR MEDICAL PURPOSES ONLY.  IF CONFIRMATION IS NEEDED FOR ANY PURPOSE, NOTIFY LAB WITHIN 5 DAYS.        LOWEST DETECTABLE LIMITS FOR URINE DRUG SCREEN Drug Class       Cutoff (ng/mL) Amphetamine      1000 Barbiturate      200 Benzodiazepine   416 Tricyclics       606 Opiates          300 Cocaine          300 THC              50   Urine microscopic-add on     Status: Abnormal   Collection Time: 03/15/15 10:20 PM  Result Value Ref Range   WBC, UA 3-6 <3 WBC/hpf   RBC / HPF 0-2 <3 RBC/hpf   Bacteria, UA FEW (A) RARE   Casts GRANULAR CAST (A) NEGATIVE   Urine-Other MUCOUS PRESENT   Comprehensive metabolic panel     Status: Abnormal   Collection Time: 03/15/15 11:01 PM  Result Value Ref Range   Sodium 139 135 - 145 mmol/L   Potassium 3.4 (L) 3.5 - 5.1 mmol/L   Chloride 101 101 - 111 mmol/L   CO2 27 22 - 32 mmol/L   Glucose, Bld 97 65 - 99 mg/dL   BUN 12 6 - 20 mg/dL   Creatinine, Ser 0.79 0.61 - 1.24 mg/dL   Calcium 8.9 8.9 - 10.3 mg/dL   Total Protein 8.3 (H) 6.5 - 8.1 g/dL   Albumin 4.1 3.5 - 5.0 g/dL   AST 28 15 - 41 U/L   ALT 24 17 - 63 U/L   Alkaline Phosphatase 77 38 - 126 U/L   Total Bilirubin 0.9 0.3 -  1.2 mg/dL   GFR calc non Af Amer >60 >60 mL/min   GFR calc Af Amer >60 >60 mL/min    Comment: (NOTE) The eGFR has been calculated using the CKD EPI equation. This calculation has not been validated in all clinical situations. eGFR's persistently <60 mL/min signify possible Chronic Kidney Disease.    Anion gap 11 5 - 15  CBC     Status: Abnormal   Collection Time: 03/15/15 11:25 PM  Result Value Ref Range   WBC 19.9 (H) 4.0 - 10.5 K/uL   RBC 5.21 4.22 - 5.81 MIL/uL   Hemoglobin 16.4 13.0 - 17.0 g/dL   HCT 47.4 39.0 - 52.0 %   MCV 91.0 78.0 - 100.0 fL   MCH 31.5 26.0 - 34.0 pg   MCHC 34.6 30.0 - 36.0 g/dL    RDW 14.0 11.5 - 15.5 %   Platelets 241 150 - 409 K/uL    Metabolic Disorder Labs:  No results found for: HGBA1C, MPG No results found for: PROLACTIN No results found for: CHOL, TRIG, HDL, CHOLHDL, VLDL, LDLCALC  Current Medications: Current Facility-Administered Medications  Medication Dose Route Frequency Provider Last Rate Last Dose  . benztropine (COGENTIN) tablet 0.5 mg  0.5 mg Oral BID Ursula Alert, MD   0.5 mg at 03/17/15 1156  . haloperidol (HALDOL) tablet 2 mg  2 mg Oral BID Delfin Gant, NP   2 mg at 03/17/15 0802  . hydrOXYzine (ATARAX/VISTARIL) tablet 25 mg  25 mg Oral TID PRN Delfin Gant, NP      . methadone (DOLOPHINE) tablet 60 mg  60 mg Oral Daily Delfin Gant, NP   60 mg at 03/17/15 0808  . nicotine (NICODERM CQ - dosed in mg/24 hours) patch 21 mg  21 mg Transdermal Daily Ursula Alert, MD   21 mg at 03/17/15 0812  . traZODone (DESYREL) tablet 100 mg  100 mg Oral QHS PRN Delfin Gant, NP      . triamcinolone cream (KENALOG) 0.1 %   Topical BID Ursula Alert, MD       PTA Medications: Prescriptions prior to admission  Medication Sig Dispense Refill Last Dose  . methadone (DOLOPHINE) 10 MG/ML solution Take 130 mg by mouth daily.   03/14/2015 at Unknown time  . benztropine (COGENTIN) 0.5 MG tablet Take 1 tablet (0.5 mg total) by mouth 2 (two) times daily. (Patient not taking: Reported on 03/12/2015) 60 tablet 0 Not Taking at Unknown time  . haloperidol (HALDOL) 10 MG tablet Take 1 tablet (10 mg total) by mouth 2 (two) times daily. (Patient not taking: Reported on 03/12/2015) 60 tablet 0 Not Taking at Unknown time  . triamcinolone cream (KENALOG) 0.5 % Apply topically 2 (two) times daily. (Patient not taking: Reported on 03/12/2015) 30 g 0 Not Taking at Unknown time    Musculoskeletal: Strength & Muscle Tone: within normal limits Gait & Station: normal Patient leans: N/A  Psychiatric Specialty Exam: Physical Exam  Constitutional:  I concur  with PE done in ED.    Review of Systems  Psychiatric/Behavioral: Positive for hallucinations and substance abuse. The patient is nervous/anxious.   All other systems reviewed and are negative.   Blood pressure 127/77, pulse 107, temperature 97.3 F (36.3 C), temperature source Oral, resp. rate 18, height 5' 10"  (1.778 m), weight 117.935 kg (260 lb), SpO2 94 %.Body mass index is 37.31 kg/(m^2).  General Appearance: Disheveled  Eye Contact::  Fair  Speech:  Clear and Coherent  Volume:  Normal  Mood:  Anxious and Depressed  Affect:  Appropriate  Thought Process:  Goal Directed  Orientation:  Full (Time, Place, and Person)  Thought Content:  Delusions, Hallucinations: Auditory, Paranoid Ideation and Rumination  Suicidal Thoughts:  Yes.  without intent/plan  Homicidal Thoughts:  No  Memory:  Immediate;   Fair Recent;   Fair Remote;   Fair  Judgement:  Impaired  Insight:  Lacking  Psychomotor Activity:  Normal  Concentration:  Poor  Recall:  [employer] of Knowledge:Fair  Language: Fair  Akathisia:  No  Handed:  Right  AIMS (if indicated):     Assets:  Communication Skills Desire for Improvement  ADL's:  Intact  Cognition: WNL  Sleep:  Number of Hours: 6.75     Treatment Plan Summary: Daily contact with patient to assess and evaluate symptoms and progress in treatment and Medication management  Patient will benefit from inpatient treatment and stabilization.  Estimated length of stay is 5-7 days.  Reviewed past medical records,treatment plan. Will start a trial of Haldol 2 mg po bid for psychosis. Pt with psychosis 2/2 substance (diphenhydramine) abuse. Will add Cogentin 0.5 mg po bid for EPS. Will add Trazodone 100 mg po qhs for sleep. Restart Methadone 60 mg po daily. Will make available Vistaril 25 mg po q6h prn for anxiety sx. Will continue to monitor vitals ,medication compliance and treatment side effects while patient is here.  Will monitor for medical issues as  well as call consult as needed.  Reviewed labs CBC - wbc elevated - chronic , CMP - K+ low - will replace with Kdur 10 meq x2 doses. EKG- Qtc wnl. Cxr - reviewed, UDS - negative ,will order TSH, lipid panel, hba1c, PL .   CSW will start working on disposition.  Patient to participate in therapeutic milieu .       Observation Level/Precautions:  15 minute checks    Psychotherapy:  Individual and group therapy     Consultations:  Social worker  Discharge Concerns:  Stability and safety  I certify that inpatient services furnished can reasonably be expected to improve the patient's condition.   Avleen Bordwell MD 10/12/201612:02 PM

## 2015-03-17 NOTE — Tx Team (Signed)
Interdisciplinary Treatment Plan Update (Adult)  Date:  03/17/2015   Time Reviewed:  8:32 AM   Progress in Treatment: Attending groups: Yes. Participating in groups:  Yes. Taking medication as prescribed:  Yes. Tolerating medication:  Yes. Family/Significant other contact made:  No Patient understands diagnosis:  Yes  As evidenced by seeking help with paranoia Discussing patient identified problems/goals with staff:  Yes, see initial care plan. Medical problems stabilized or resolved:  Yes. Denies suicidal/homicidal ideation: Yes. Issues/concerns per patient self-inventory:  No. Other:  New problem(s) identified:  Discharge Plan or Barriers:  See below  Reason for Continuation of Hospitalization: Anxiety Depression Medication stabilization Other; describe Paranoia, poor sleep  Comments:  Pt presents today as cooperative , calm. Pt reports that he had been abusing a lot of Unisom ( Diphenhydramine) at home . Pt reports using 1 bottle a day . Pt reports having sleep issues and reports he had been trying to treat himself with that. Pt reports that he has been depressed since the past 7 months or so. His ex girlfriend and himself separated 7 months ago and that has been distressing to him. Pt reports that he has been having anhedonia , crying spells as well as psychosis. He has AH of his dad talking to him through the phone. He also felt paranoid as described above. Pt also had SI , on and off. Haldol trail  Estimated length of stay: 4-5 days  New goal(s):  Review of initial/current patient goals per problem list:   Review of initial/current patient goals per problem list:  1. Goal(s): Patient will participate in aftercare plan   Met: Yes   Target date: 3-5 days post admission date   As evidenced by: Patient will participate within aftercare plan AEB aftercare provider and housing plan at discharge being identified.  03/17/2015:  Return home, follow up outpt   2. Goal  (s): Patient will exhibit decreased depressive symptoms and suicidal ideations.   Met:    Target date: 3-5 days post admission date   As evidenced by: Patient will utilize self rating of depression at 3 or below and demonstrate decreased signs of depression or be deemed stable for discharge by MD. 03/17/15 Pt rates his depression at a 9 today    3. Goal(s): Patient will demonstrate decreased signs and symptoms of anxiety.   Met:    Target date: 3-5 days post admission date   As evidenced by: Patient will utilize self rating of anxiety at 3 or below and demonstrated decreased signs of anxiety, or be deemed stable for discharge by MD 03/17/15  Pt rates his anxiety at a 5 today     5. Goal(s): Patient will demonstrate decreased signs of psychosis  * Met: No  * Target date: 3-5 days post admission date  * As evidenced by: Patient will demonstrate decreased frequency of AVH or return to baseline function 03/17/15  Pt endorses paranoia today.            Attendees: Patient:  03/17/2015 8:32 AM   Family:   03/17/2015 8:32 AM   Physician:  Ursula Alert, MD 03/17/2015 8:32 AM   Nursing:   Gaylan Gerold, RN 03/17/2015 8:32 AM   CSW:    Roque Lias, LCSW   03/17/2015 8:32 AM   Other:  03/17/2015 8:32 AM   Other:   03/17/2015 8:32 AM   Other:  Lars Pinks, Nurse CM 03/17/2015 8:32 AM   Other:  Lucinda Dell, [facility] 03/17/2015 8:32 AM  OtherNorberto Sorenson, [facility]  03/17/2015 8:32 AM   Other:  03/17/2015 8:32 AM   Other:  03/17/2015 8:32 AM   Other:  03/17/2015 8:32 AM   Other:  03/17/2015 8:32 AM   Other:  03/17/2015 8:32 AM   Other:   03/17/2015 8:32 AM    Scribe for Treatment Team:   Trish Mage, 03/17/2015 8:32 AM

## 2015-03-17 NOTE — Progress Notes (Signed)
Patient ID: [name], male   DOB: 01/14/1981, 34 y.o.   MRN: [id number]  DAR: Pt. Denies SI/HI and A/V Hallucinations. Patient does not report any pain or discomfort at this time. Support and encouragement provided to the patient. Scheduled medications administered to patient per physician's orders. Patient remains minimal but appropriate when answering questions. He is seen in the milieu intermittently affect remains blunted and mood appropriate to circumstance. Q15 minute checks are maintained for safety.

## 2015-03-17 NOTE — [facility] Group Notes (Signed)
[facility] Note   03/17/2015 1:44 PM  Participation Quality:  Engaged  Mood/Affect:  Flat  Depression Rating:    Anxiety Rating:    Thoughts of Suicide:  No Will you contract for safety?   NA  Current AVH:  Describes paranoia  Plan for Discharge/Comments:  [name] talked about leading a relatively unproductive life.  "I get up, go to [facility] to get my dose, go home and watch TV.  Sometimes I help out my dad if he needs me for something."  Describes increasing paranoia over the past weeks.  "I've never had it this bad before.  When questioned about meds from last hospitalization, would not answer directly, but made it sound like he did not take them post d/c.  [facility] reurn home, [facility]  Transportation Means:   Supports:  Daryel GeraldNorth, Tylena Prisk B

## 2015-03-18 LAB — LIPID PANEL
CHOL/HDL RATIO: 6.2 ratio
Cholesterol: 198 mg/dL (ref 0–200)
HDL: 32 mg/dL — AB (ref >40)
LDL CALC: 122 mg/dL — AB (ref 0–99)
Triglycerides: 221 mg/dL — ABNORMAL HIGH (ref <150)
VLDL: 44 mg/dL — AB (ref 0–40)

## 2015-03-18 LAB — TSH: TSH: 1.73 u[IU]/mL (ref 0.350–4.500)

## 2015-03-18 MED ORDER — BENZTROPINE MESYLATE 0.5 MG PO TABS
0.5000 mg | ORAL_TABLET | Freq: Two times a day (BID) | ORAL | Status: DC | PRN
  Administered 2015-03-18 – 2015-03-21 (×2): 0.5 mg via ORAL
  Filled 2015-03-18: qty 14
  Filled 2015-03-18: qty 1

## 2015-03-18 NOTE — Progress Notes (Signed)
[facility] MD Progress Note  03/18/2015 1:01 PM [name]  MRN:  [id number] Subjective: Patient states " I still hear voices .'  Objective;[name] is a 68 y old CM with hx of substance use disorder as well as psychosis , presented IVC ed taken out by father. Patient per initial notes in ED has been acting aggressively at home. Patient believed that the police are coming for him. Pt was withdrawn to his room due to paranoia , and was ranting about people out to get him."  Patient seen and chart reviewed.Discussed patient with treatment team. Pt today appears less anxious. Pt continues to be paranoid and has AH of his dad talking to him. Pt reports sleep as better last night. Pt today also reports some problems urinating - unknown if this is medication induced. He may have had this issues prior to admission as well. Will make cogentin prn - will observe him on the unit.UA done on 10/10- wnl.  Per staff - pt has been tolerating his medications well. No disruptive issues noted on the unit.   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 Opioid use disorder, moderate, on maintenance therapy [F11.90] 03/17/2015  . Cannabis use disorder, moderate, in sustained remission [F12.90] 03/17/2015  . Psoriasis [L40.9] 03/17/2015  . Aggressive behavior of adult [F60.89]   . Opioid dependence with withdrawal (HCC) [F11.23]   . Psychoses [F29]   . Polysubstance abuse [F19.10] 04/28/2014   Total Time spent with patient: 30 minutes  Past Psychiatric History: Patient with hx of substance abuse/ induced psychosis, admitted to [facility]/27/14- 05/07/14 . [facility]. Pt is on Methadone maintenance  Past Medical  History:  Past Medical History  Diagnosis Date  . Abscess   . Heroin addiction (HCC)   . GERD (gastroesophageal reflux disease)    History reviewed. No pertinent past surgical history. Family History:  Family History  Problem Relation Age of Onset  . Mental illness Neg Hx   . Hypertension Neg Hx     Family Psychiatric History: Patient denies hx of mental illness, substance abuse , suicide in family.  Social History: Patient is unemployed. Lives with his father.        History  Alcohol Use  . Yes    Comment: Denies any alcohol     History  Drug Use  . Yes  . Special: Other-see comments    Comment: abuses Unisom, hx of cannabis abuse, bzd abuse , stimulant abuse    Social History   Social History  . Marital Status: Single    Spouse Name: N/A  . Number of Children: N/A  . Years of Education: N/A   Social History Main Topics  . Smoking status: Current Every Day Smoker -- 1.50 packs/day for 15 years  . Smokeless tobacco: Never Used  . Alcohol Use: Yes     Comment: Denies any alcohol  . Drug Use: Yes    Special: Other-see comments     Comment: abuses Unisom, hx of cannabis abuse, bzd abuse , stimulant abuse  . Sexual Activity: No   Other Topics Concern  . None   Social History Narrative   Additional Social History:  Sleep: Fair  Appetite:  Fair  Current Medications: Current Facility-Administered Medications  Medication Dose Route Frequency Provider Last Rate Last Dose  . benztropine (COGENTIN) tablet 0.5 mg  0.5 mg Oral BID PRN Jomarie Longs, MD      . haloperidol (HALDOL) tablet 2 mg  2 mg Oral BID Earney Navy, NP   2 mg at 03/18/15 7829  . hydrOXYzine (ATARAX/VISTARIL) tablet 25 mg  25 mg Oral TID PRN Earney Navy, NP      . methadone (DOLOPHINE) tablet 60 mg  60 mg Oral Daily Earney Navy, NP   60 mg at 03/18/15 0828  . nicotine (NICODERM CQ - dosed in mg/24 hours) patch 21 mg  21 mg Transdermal  Daily Devonne Kitchen, MD   21 mg at 03/18/15 0830  . traZODone (DESYREL) tablet 100 mg  100 mg Oral QHS PRN Earney Navy, NP      . triamcinolone cream (KENALOG) 0.1 %   Topical BID Jomarie Longs, MD        Lab Results:  Results for orders placed or performed during the hospital encounter of 03/16/15 (from the past 48 hour(s))  TSH     Status: None   Collection Time: 03/18/15  6:50 AM  Result Value Ref Range   TSH 1.730 0.350 - 4.500 uIU/mL    Comment: Performed at [facility]  Lipid panel     Status: Abnormal   Collection Time: 03/18/15  6:50 AM  Result Value Ref Range   Cholesterol 198 0 - 200 mg/dL   Triglycerides 562 (H) <150 mg/dL   HDL 32 (L) >13 mg/dL   Total CHOL/HDL Ratio 6.2 RATIO   VLDL 44 (H) 0 - 40 mg/dL   LDL Cholesterol 086 (H) 0 - 99 mg/dL    Comment:        Total Cholesterol/HDL:CHD Risk Coronary Heart Disease Risk Table                     Men   Women  1/2 Average Risk   3.4   3.3  Average Risk       5.0   4.4  2 X Average Risk   9.6   7.1  3 X Average Risk  23.4   11.0        Use the calculated Patient Ratio above and the CHD Risk Table to determine the patient's CHD Risk.        ATP III CLASSIFICATION (LDL):  <100     mg/dL   Optimal  578-469  mg/dL   Near or Above                    Optimal  130-159  mg/dL   Borderline  629-528  mg/dL   High  >413     mg/dL   Very High Performed at [facility]     Physical Findings: AIMS:  , ,  ,  ,    CIWA:    COWS:     Musculoskeletal: Strength & Muscle Tone: within normal limits Gait & Station: normal Patient leans: N/A  Psychiatric Specialty Exam: Review of Systems  Psychiatric/Behavioral: Positive for hallucinations. The patient is nervous/anxious.   All other systems reviewed and are negative.   Blood pressure 123/79, pulse 98, temperature 97.1 F (36.2 C), temperature source Oral, resp. rate 18, height  (1.778 m), weight 117.935 kg (260 lb), SpO2 94 %.Body  mass index  is 37.31 kg/(m^2).  General Appearance: Fairly Groomed  Patent attorneyye Contact::  Fair  Speech:  Clear and Coherent  Volume:  Normal  Mood:  Anxious  Affect:  Congruent  Thought Process:  Intact  Orientation:  Full (Time, Place, and Person)  Thought Content:  Delusions, Hallucinations: Auditory, Paranoid Ideation and Rumination  Suicidal Thoughts:  No  Homicidal Thoughts:  No  Memory:  Immediate;   Fair Recent;   Fair Remote;   Fair  Judgement:  Impaired  Insight:  Shallow  Psychomotor Activity:  Restlessness  Concentration:  Poor  Recall:  FiservFair  Fund of Knowledge:Fair  Language: Fair  Akathisia:  No  Handed:  Right  AIMS (if indicated):     Assets:  Communication Skills Desire for Improvement  ADL's:  Intact  Cognition: WNL  Sleep:  Number of Hours: 6.75   Treatment Plan Summary: Daily contact with patient to assess and evaluate symptoms and progress in treatment and Medication management Will increase Haldol to 5 mg po bid for psychosis. Pt with psychosis 2/2 substance (diphenhydramine) abuse. Will make Cogentin 0.5 mg po bid prn for EPS. Will continue Trazodone 100 mg po qhs for sleep. Continue  Methadone 60 mg po daily. Will make available Vistaril 25 mg po q6h prn for anxiety sx. Will continue to monitor vitals ,medication compliance and treatment side effects while patient is here.  Will monitor for medical issues as well as call consult as needed.  Reviewed labs TSH- wnl , lipid panel- dyslipidemia ( will place dietician consult ), hba1c pending, PL pending. CSW will start working on disposition.  Patient to participate in therapeutic milieu .    Yaretsi Humphres MD 03/18/2015, 1:01 PM

## 2015-03-18 NOTE — [facility] Group Notes (Signed)
[facility] LCSW Group Therapy  03/18/2015 3:20 PM  Type of Therapy:  Group Therapy  Participation Level:  Active  Participation Quality:  Appropriate  Affect:  Calm initially, somewhat irritable w peer and staff member   Cognitive:  Appropriate  Insight:  Improving  Engagement in Therapy:  Improving  Modes of Intervention:  Discussion, Exploration, Problem-solving and Support  Today's Topic: Overcoming Obstacles. Patients identified one short term goal and potential obstacles in reaching this goal. Patients processed barriers involved in overcoming these obstacles. Patients identified steps necessary for overcoming these obstacles and explored motivation (internal and external) for facing these difficulties head on.   Patient was somewhat willing to discuss obstacles, appeared to resonate w comments of others about unsupportive family/friends as obstacle to recovery.  Had minor conflict w peer in group, was perceived to be disrespectful by peer who struggles w self esteem issues.  Was able to apologize to peer.     Sallee LangeCunningham, Anne C 03/18/2015, 3:20 PM

## 2015-03-18 NOTE — [facility] Group Notes (Signed)
[facility] Group Notes:  (Nursing--Orientation)  Date:  03/18/2015  Time:  0930  Type of Therapy:  Nurse Education  Participation Level:  Active  Participation Quality:  Appropriate, Attentive and Sharing  Affect:  Anxious  Cognitive:  Alert, Appropriate and Oriented  Insight:  Improving  Engagement in Group:  Engaged and Supportive  Modes of Intervention:  Discussion, Education, Orientation and Support  Summary of Progress/Problems: Pt attended scheduled group and was engaged with others throughout this session.   Ouida SillsWesseh, Lincoln Maxinlivette 03/18/2015 0930

## 2015-03-18 NOTE — Progress Notes (Signed)
D    Pt interacted appropriately with staff and peers     He attended and participated in group and has been cooperative with treatment A    Verbal support provided by staff   Medications offered    Q 15 min checks R   Pt safe and declined medications

## 2015-03-18 NOTE — Plan of Care (Signed)
Problem: Food- and Nutrition-Related Knowledge Deficit (NB-1.1) Goal: Nutrition education Formal process to instruct or train a patient/client in a skill or to impart knowledge to help patients/clients voluntarily manage or modify food choices and eating behavior to maintain or improve health. Outcome: Completed/Met Date Met:  03/18/15 Nutrition Education Note  RD consulted for nutrition education regarding hyperlipidemia.  Lipid Panel     Component Value Date/Time    CHOL 198 03/18/2015 0650    TRIG 221* 03/18/2015 0650    HDL 32* 03/18/2015 0650    CHOLHDL 6.2 03/18/2015 0650    VLDL 44* 03/18/2015 0650    LDLCALC 122* 03/18/2015 0650    RD provided "High Triglycerides Nutrition Therapy" handout from the Academy of [employer] Dietetics. Reviewed patient's dietary recall. Provided examples on ways to decrease sugar and fat intake in diet. Discouraged intake of processed foods and consumption of sugar-sweetened beverages. Encouraged fresh fruits and vegetables as well as whole grain sources of carbohydrates to maximize fiber intake. Teach back method used.  Expect good compliance. Pt made the goal of decreasing his intake of sodas.  Body mass index is 37.31 kg/(m^2). Pt meets criteria for obesity based on current BMI.  Diet Order:   Pt is also offered choice of unit snacks mid-morning and mid-afternoon.  Pt is eating as desired.   Labs and medications reviewed. No further nutrition interventions warranted at this time. If additional nutrition issues arise, please re-consult RD.  Clayton Bibles, MS, RD, LDN Pager: [phone] After Hours Pager: [phone]

## 2015-03-18 NOTE — Plan of Care (Signed)
Problem: Ineffective individual coping Goal: STG: Patient will remain free from self harm Outcome: Progressing Pt denies SI, HI, AVH and pain when assessed. Remains safe on and off unit without outburst or self injurious behavior to note at present. Q 15 minutes checks maintained.

## 2015-03-18 NOTE — Progress Notes (Signed)
The patient attended Karaoke group this evening and was appropriately behaved.  

## 2015-03-18 NOTE — Progress Notes (Signed)
D: Pt presents with blunt affect and anxious mood. Denied SI, HI, AVH and pain with Clinical research associatewriter when assessed. Attended scheduled groups and was verbally engaged in session. Minimal but appropriate interactions observed with others. Poor eye contact observed on interactions.  A: Scheduled medications administered as per MD's orders. Availability and support offered this shift. Pt encouraged to voice needs. Pt informed of changes in current treatment regimen (Diet consult, PRN Cogentin for EPS, tremors). Q 15 minutes checks maintained for safety on and off unit without outburst to note thus far this shift.  R: Pt receptive to care. Remains compliant with ordered medications. Denies adverse drug reactions when assessed. Continue plan of care.

## 2015-03-19 DIAGNOSIS — E221 Hyperprolactinemia: Secondary | ICD-10-CM | POA: Clinically undetermined

## 2015-03-19 LAB — PROLACTIN: Prolactin: 31.5 ng/mL — ABNORMAL HIGH (ref 4.0–15.2)

## 2015-03-19 LAB — HEMOGLOBIN A1C
HEMOGLOBIN A1C: 5.8 % — AB (ref 4.8–5.6)
MEAN PLASMA GLUCOSE: 120 mg/dL

## 2015-03-19 MED ORDER — ALUM & MAG HYDROXIDE-SIMETH 200-200-20 MG/5ML PO SUSP
30.0000 mL | Freq: Four times a day (QID) | ORAL | Status: DC | PRN
  Administered 2015-03-20: 30 mL via ORAL
  Filled 2015-03-19: qty 30

## 2015-03-19 NOTE — Progress Notes (Signed)
D- Upon assessment patient was noted to be well groomed, pleasant and cooperative.  Patient was compliant with AM medications and was focused on treatment and discharge planning.  Patient reported that prior to coming into the hospital it was like a "football stadium of people talking in my head."  Now patient reports no AVH.  Aside from medication therapy patient reports that attending groups and talking with others helps to decrease the presence of voices.  Patient has good insight into illness and is able to express the importance of maintaining sobriety.    A- Assessed patient for safety, offer medications as prescribed, monitored for effectiveness and adverse effects of medication, assessed for pain, encouraged patient to attend groups.   R- Patient has been able to maintain safety, was compliant with all medications without adverse effects, continued to report a decrease in AVH maintaining that there are none present.  Patient has no reports of pain. Patient is able to contract for safety.

## 2015-03-19 NOTE — [facility] Group Notes (Signed)
[facility] LCSW Group Therapy  03/19/2015 1:37 PM   Type of Therapy:  Group Therapy  Participation Level:  Active  Participation Quality:  Appropriate, Attentive, Sharing and Supportive  Affect:  Appropriate  Cognitive:  Appropriate  Insight:  Developing/Improving  Engagement in Therapy:  Engaged  Modes of Intervention:  Discussion, Exploration, Socialization and Support  Summary of Progress/Problems:  Chaplain led group explored concept of hope and its relevance to mental health recovery.  Patients explored themes including what matters to them personally, how others responses are similar/different, and what they are hopeful for.  Group members discussed relevance of social supports, innter strength and using their own stories to craft a recovery path.  Shared how relationships were important part of developing hope in his life.    Sallee Langeunningham, [name] C

## 2015-03-19 NOTE — Progress Notes (Signed)
Insightful

## 2015-03-19 NOTE — Tx Team (Signed)
Interdisciplinary Treatment Plan Update (Adult)  Date:  03/19/2015   Time Reviewed:  2:34 PM   Progress in Treatment: Attending groups: Yes. Participating in groups:  Yes. Taking medication as prescribed:  Yes. Tolerating medication:  Yes. Family/Significant other contact made:  No Patient understands diagnosis:  Yes  As evidenced by seeking help with paranoia Discussing patient identified problems/goals with staff:  Yes, see initial care plan. Medical problems stabilized or resolved:  Yes. Denies suicidal/homicidal ideation: Yes. Issues/concerns per patient self-inventory:  No. Other:  New problem(s) identified:  Discharge Plan or Barriers:  See below  Reason for Continuation of Hospitalization: Anxiety Depression Medication stabilization Other; describe Paranoia, poor sleep  Comments:  Pt presents today as cooperative , calm. Pt reports that he had been abusing a lot of Unisom ( Diphenhydramine) at home . Pt reports using 1 bottle a day . Pt reports having sleep issues and reports he had been trying to treat himself with that. Pt reports that he has been depressed since the past 7 months or so. His ex girlfriend and himself separated 7 months ago and that has been distressing to him. Pt reports that he has been having anhedonia , crying spells as well as psychosis. He has AH of his dad talking to him through the phone. He also felt paranoid as described above. Pt also had SI , on and off. Haldol trial 10/14:  Patient requests information on [facility] and wants to transfer to [facility] clinic due to cost. Patient given information on [facility].  Continues to report some paranoia AEB thinking that others are talking about him, but able to state that these thoughts are not reality based.  Estimated length of stay: 4-5 days  New goal(s):  Review of initial/current patient goals per problem list:   Review of initial/current patient goals per problem list:  1. Goal(s): Patient will participate in  aftercare plan   Met: Yes   Target date: 3-5 days post admission date   As evidenced by: Patient will participate within aftercare plan AEB aftercare provider and housing plan at discharge being identified.  03/19/2015:  Return home, follow up outpt.  Will resume methadone maintenance outpatient   2. Goal (s): Patient will exhibit decreased depressive symptoms and suicidal ideations.   Met: 10/14   Target date: 3-5 days post admission date   As evidenced by: Patient will utilize self rating of depression at 3 or below and demonstrate decreased signs of depression or be deemed stable for discharge by MD. 03/17/15 Pt rates his depression at a 9 today 03/19/15:  Pt rates depression at 0 today,     3. Goal(s): Patient will demonstrate decreased signs and symptoms of anxiety.   Met: 10/14   Target date: 3-5 days post admission date   As evidenced by: Patient will utilize self rating of anxiety at 3 or below and demonstrated decreased signs of anxiety, or be deemed stable for discharge by MD 03/17/15  Pt rates his anxiety at a 5 today 03/19/15:  Pt related anxiety has decreased to 0,awaiting discharge, goal met.     5. Goal(s): Patient will demonstrate decreased signs of psychosis  * Met: No  * Target date: 3-5 days post admission date  * As evidenced by: Patient will demonstrate decreased frequency of AVH or return to baseline function 03/17/15  Pt endorses paranoia today. 03/19/15:  Pt states he sometimes thinks others are talking about him behind his back, aware that this may not be real and uses  coping strategies to remind himself that these are not reality based thoughts.              Attendees: Patient:  03/19/2015 2:34 PM   Family:   03/19/2015 2:34 PM   Physician:  Ursula Alert, MD 03/19/2015 2:34 PM   Nursing:   Erin Sons, RN 03/19/2015 2:34 PM   CSW:    Edwyna Shell, LCSW   03/19/2015 2:34 PM   Other:  03/19/2015 2:34 PM   Other:    03/19/2015 2:34 PM   Other:  Lars Pinks, Nurse CM 03/19/2015 2:34 PM   Other:  Lucinda Dell, Beverly Sessions TCT 03/19/2015 2:34 PM   Other:  Norberto Sorenson, [place]  03/19/2015 2:34 PM   Other:  03/19/2015 2:34 PM   Other:  03/19/2015 2:34 PM   Other:  03/19/2015 2:34 PM   Other:  03/19/2015 2:34 PM   Other:  03/19/2015 2:34 PM   Other:   03/19/2015 2:34 PM    Scribe for Treatment Team:   Trish Mage, 03/19/2015 2:34 PM

## 2015-03-19 NOTE — Progress Notes (Addendum)
[facility] MD Progress Note  03/19/2015 1:06 PM Venora Mapleshomas E [place]  MRN:  [id number] Subjective: Patient states " I am better .'  Objective;Modesto Charonhomas Mccrystal is a [age] y old CM with hx of substance use disorder as well as psychosis , presented IVC ed taken out by father. Patient per initial notes in ED has been acting aggressively at home. Patient believed that the police are coming for him. Pt was withdrawn to his room due to paranoia , and was ranting about people out to get him."  Patient seen and chart reviewed.Discussed patient with treatment team. Pt today appears less anxious, less depressed. Pt reports paranoia as improving , AH as reduced. Pt denies sleep issues today. Pt encouraged to attend groups and stay compliant on medications. Per staff - pt has been tolerating his medications well. No disruptive issues noted on the unit.   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 Hyperprolactinemia (HCC) [E22.1] 03/19/2015  . 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 Opioid use disorder, moderate, on maintenance therapy [F11.90] 03/17/2015  . Cannabis use disorder, moderate, in sustained remission [F12.90] 03/17/2015  . Psoriasis [L40.9] 03/17/2015  . Aggressive behavior of adult [F60.89]   . Opioid dependence with withdrawal (HCC) [F11.23]   . Psychoses [F29]   . Polysubstance abuse [F19.10] 04/28/2014   Total Time spent with patient: 25 minutes  Past Psychiatric History: Patient with hx of substance abuse/ induced psychosis, [facility] . [facility] Pt is on Methadone maintenance  Past Medical History:  Past Medical History  Diagnosis Date  . Abscess   . Heroin addiction (HCC)   . GERD (gastroesophageal reflux disease)     History reviewed. No pertinent past surgical history. Family History:  Family History  Problem Relation Age of Onset  . Mental illness Neg Hx   . Hypertension Neg Hx     Family Psychiatric History: Patient denies hx of mental illness, substance abuse , suicide in family.  Social History: Patient is unemployed. Lives with his father.        History  Alcohol Use  . Yes    Comment: Denies any alcohol     History  Drug Use  . Yes  . Special: Other-see comments    Comment: abuses Unisom, hx of cannabis abuse, bzd abuse , stimulant abuse    Social History   Social History  . Marital Status: Single    Spouse Name: N/A  . Number of Children: N/A  . Years of Education: N/A   Social History Main Topics  . Smoking status: Current Every Day Smoker -- 1.50 packs/day for 15 years  . Smokeless tobacco: Never Used  . Alcohol Use: Yes     Comment: Denies any alcohol  . Drug Use: Yes    Special: Other-see comments     Comment: abuses Unisom, hx of cannabis abuse, bzd abuse , stimulant abuse  . Sexual Activity: No   Other Topics Concern  . None   Social History Narrative   Additional Social History:                         Sleep: Fair  Appetite:  Fair  Current Medications: Current Facility-Administered Medications  Medication Dose Route Frequency Provider Last Rate Last Dose  .  alum & mag hydroxide-simeth (MAALOX/MYLANTA) 200-200-20 MG/5ML suspension 30 mL  30 mL Oral Q6H PRN Jomarie Longs, MD      . benztropine (COGENTIN) tablet 0.5 mg  0.5 mg Oral BID PRN Jomarie Longs, MD   0.5 mg at 03/18/15 1651  . haloperidol (HALDOL) tablet 2 mg  2 mg Oral BID Earney Navy, NP   2 mg at 03/19/15 0813  . hydrOXYzine (ATARAX/VISTARIL) tablet 25 mg  25 mg Oral TID PRN Earney Navy, NP      . methadone (DOLOPHINE) tablet 60 mg  60 mg Oral Daily Earney Navy, NP   60 mg at 03/19/15 0813  . nicotine (NICODERM CQ - dosed in mg/24 hours) patch 21 mg  21  mg Transdermal Daily Jomarie Longs, MD   21 mg at 03/19/15 0818  . traZODone (DESYREL) tablet 100 mg  100 mg Oral QHS PRN Earney Navy, NP   100 mg at 03/18/15 2159  . triamcinolone cream (KENALOG) 0.1 %   Topical BID Jomarie Longs, MD        Lab Results:  Results for orders placed or performed during the hospital encounter of 03/16/15 (from the past 48 hour(s))  TSH     Status: None   Collection Time: 03/18/15  6:50 AM  Result Value Ref Range   TSH 1.730 0.350 - 4.500 uIU/mL    Comment: Performed at [facility]  Lipid panel     Status: Abnormal   Collection Time: 03/18/15  6:50 AM  Result Value Ref Range   Cholesterol 198 0 - 200 mg/dL   Triglycerides 161 (H) <150 mg/dL   HDL 32 (L) >09 mg/dL   Total CHOL/HDL Ratio 6.2 RATIO   VLDL 44 (H) 0 - 40 mg/dL   LDL Cholesterol 604 (H) 0 - 99 mg/dL    Comment:        Total Cholesterol/HDL:CHD Risk Coronary Heart Disease Risk Table                     Men   Women  1/2 Average Risk   3.4   3.3  Average Risk       5.0   4.4  2 X Average Risk   9.6   7.1  3 X Average Risk  23.4   11.0        Use the calculated Patient Ratio above and the CHD Risk Table to determine the patient's CHD Risk.        ATP III CLASSIFICATION (LDL):  <100     mg/dL   Optimal  540-981  mg/dL   Near or Above                    Optimal  130-159  mg/dL   Borderline  191-478  mg/dL   High  >295     mg/dL   Very High Performed at [facility]   Hemoglobin A1c     Status: Abnormal   Collection Time: 03/18/15  6:50 AM  Result Value Ref Range   Hgb A1c MFr Bld 5.8 (H) 4.8 - 5.6 %    Comment: (NOTE)         Pre-diabetes: 5.7 - 6.4         Diabetes: >6.4         Glycemic control for adults with diabetes: <7.0    Mean Plasma Glucose 120 mg/dL    Comment: (NOTE) Performed At: [facility] [address] [place], [place] [id number] Mila Homer MD [id number] Performed at [facility]    Prolactin     Status: Abnormal   Collection Time: 03/18/15  6:50 AM  Result Value Ref Range   Prolactin 31.5 (H) 4.0 - 15.2 ng/mL    Comment: (NOTE) Performed At: [facility] [address] [place], [place] [id number] Mila Homer MD [id number] Performed at [facility]     Physical Findings: AIMS:  , ,  ,  ,    CIWA:    COWS:     Musculoskeletal: Strength & Muscle Tone: within normal limits Gait & Station: normal Patient leans: N/A  Psychiatric Specialty Exam: Review of Systems  Psychiatric/Behavioral: Positive for hallucinations. The patient is nervous/anxious.   All other systems reviewed and are negative.   Blood pressure 123/54, pulse 116, temperature 98.6 F (37 C), temperature source Oral, resp. rate 20, height  (1.778 m), weight 117.935 kg (260 lb), SpO2 94 %.Body mass index is 37.31 kg/(m^2).  General Appearance: Fairly Groomed  Patent attorney::  Fair  Speech:  Clear and Coherent  Volume:  Normal  Mood:  Anxious improving  Affect:  Congruent  Thought Process:  Intact  Orientation:  Full (Time, Place, and Person)  Thought Content:  Delusions, Hallucinations: Auditory, Paranoid Ideation and Rumination improving   Suicidal Thoughts:  No  Homicidal Thoughts:  No  Memory:  Immediate;   Fair Recent;   Fair Remote;   Fair  Judgement:  Impaired  Insight:  Shallow  Psychomotor Activity:  Restlessness  Concentration:  Fair  Recall:  [employer] of Knowledge:Fair  Language: Fair  Akathisia:  No  Handed:  Right  AIMS (if indicated):     Assets:  Communication Skills Desire for Improvement  ADL's:  Intact  Cognition: WNL  Sleep:  Number of Hours: 5.25   Treatment Plan Summary: Daily contact with patient to assess and evaluate symptoms and progress in treatment and Medication management Will continue Haldol  5 mg po bid for psychosis. Pt with psychosis 2/2 substance (diphenhydramine) abuse. Will make Cogentin 0.5 mg po bid  prn for EPS. Will continue Trazodone 100 mg po qhs for sleep. Continue  Methadone 60 mg po daily. Will make available Vistaril 25 mg po q6h prn for anxiety sx. Will continue to monitor vitals ,medication compliance and treatment side effects while patient is here.  Will monitor for medical issues as well as call consult as needed.  Reviewed labs TSH- wnl , lipid panel- dyslipidemia ( will place dietician consult ), hba1c - 5.8, PL slightly elevated- pt to follow up on an out patient basis . CSW will start working on disposition.  Patient to participate in therapeutic milieu .    Keturah Yerby MD 03/19/2015, 1:06 PM

## 2015-03-19 NOTE — [facility] Group Notes (Signed)
[facility]   03/19/2015 3:50 PM  Participation Quality:  Active  Mood/Affect:  Appropriate  Depression Rating:  0  Anxiety Rating:  0  Thoughts of Suicide:  No Will you contract for safety?   NA  Current AVH:  denies, but later states that he thinks people are talking about him, realizes this is not based in reality.    Plan for Discharge/Comments:  Return home, [facility] - will also investigate switch to ADS for methadone maintenance  Transportation Means: father   Supports:  Family members  Sallee LangeCunningham, Anne C

## 2015-03-19 NOTE — Progress Notes (Signed)
[facility] Group Notes:  (Nursing/MHT/Case Management/Adjunct)  Date:  03/19/2015  Time:  9:21 PM  Type of Therapy:  Psychoeducational Skills  Participation Level:  Active  Participation Quality:  Appropriate  Affect:  Appropriate  Cognitive:  Appropriate  Insight:  Improving  Engagement in Group:  Engaged  Modes of Intervention:  Education  Summary of Progress/Problems: The patient had a good day overall. The patient states that he has been more awake today. He has been eating better and anticipates being discharged. As a theme for the day, his relapse prevention will involve spending more time with his father and step mother.   Hazle CocaGOODMAN, Bellarae Lizer S 03/19/2015, 9:21 PM

## 2015-03-19 NOTE — Plan of Care (Signed)
Problem: Alteration in thought process Goal: STG-Patient is able to sleep at least 6 hours per night Outcome: Progressing Patient received PRN trazodone 100 mg for sleep with good results noted and he was able to sleep uninterrupted throughout the night. Monitoring continues.

## 2015-03-19 NOTE — Progress Notes (Signed)
Patient ID: [name], male   DOB: 12-02-1980, 34 y.o.   MRN: [id number] D: Patient talked to about his day and how he was progressing. Patient stated "I am not hearing the voices as much and my medications are working for me." A: Patient encouraged to continue to take medications as ordered when he is discharged and educated on the importance of being medication compliant. Patient verbalize d understanding, Medications reviewed with patient. R: Patient took night medications as ordered. He is pleased with the results of his medications and verbalizes how he wants to continue to get better.

## 2015-03-20 MED ORDER — SERTRALINE HCL 25 MG PO TABS
25.0000 mg | ORAL_TABLET | Freq: Every day | ORAL | Status: DC
  Administered 2015-03-20 – 2015-03-21 (×2): 25 mg via ORAL
  Filled 2015-03-20: qty 7
  Filled 2015-03-20 (×4): qty 1

## 2015-03-20 NOTE — Progress Notes (Signed)
Patient ID: [name], male   DOB: [birth year], 34 y.o.   MRN: [id number] [facility] MD Progress Note  03/20/2015 11:20 AM [name]  MRN:  [id number] Subjective: Patient states, " I stopped taking my meds.  I started to hear voices.  I was hearing a my dad who was not even there.  I could hear packed football stadium shouting and I could hear Obama's voice."  Objective;[name] is a 31 y old CM with hx of substance use disorder as well as psychosis , presented IVC ed taken out by father. Patient per initial notes in ED has been acting aggressively at home. Patient believed that the police are coming for him. Pt was withdrawn to his room due to paranoia , and was ranting about people out to get him."  Patient seen and chart reviewed.Discussed patient with treatment team. Pt today appears less anxious, less depressed. Pt reports AH reduced. Pt denies sleep issues today.  He states that being in the hospital he is happier.  States he thinks he is depressed.   Pt encouraged to attend groups and stay compliant on medications. Per staff - pt has been tolerating his medications well. No disruptive issues noted on the unit.   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 Hyperprolactinemia (HCC) [E22.1] 03/19/2015  . 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 Opioid use disorder, moderate, on maintenance therapy [F11.90] 03/17/2015  . Cannabis use disorder, moderate, in sustained remission [F12.90] 03/17/2015  . Psoriasis [L40.9] 03/17/2015  . Aggressive behavior of adult [F60.89]   . Opioid dependence with withdrawal (HCC) [F11.23]   . Psychoses [F29]   . Polysubstance abuse [F19.10] 04/28/2014   Total Time spent with patient: 25 minutes  Past Psychiatric  History: Patient with hx of substance abuse/ induced psychosis, admitted to [facility]27/14- 05/07/14 . [facility] Pt is on Methadone maintenance  Past Medical History:  Past Medical History  Diagnosis Date  . Abscess   . Heroin addiction (HCC)   . GERD (gastroesophageal reflux disease)    History reviewed. No pertinent past surgical history. Family History:  Family History  Problem Relation Age of Onset  . Mental illness Neg Hx   . Hypertension Neg Hx     Family Psychiatric History: Patient denies hx of mental illness, substance abuse , suicide in family.  Social History: Patient is unemployed. Lives with his father.        History  Alcohol Use  . Yes    Comment: Denies any alcohol     History  Drug Use  . Yes  . Special: Other-see comments    Comment: abuses Unisom, hx of cannabis abuse, bzd abuse , stimulant abuse    Social History   Social History  . Marital Status: Single    Spouse Name: N/A  . Number of Children: N/A  . Years of Education: N/A   Social History Main Topics  . Smoking status: Current Every Day Smoker -- 1.50 packs/day for 15 years  . Smokeless tobacco: Never Used  . Alcohol Use: Yes     Comment: Denies any alcohol  . Drug Use: Yes    Special: Other-see comments     Comment: abuses Unisom, hx of cannabis abuse, bzd abuse , stimulant abuse  . Sexual Activity: No  Other Topics Concern  . None   Social History Narrative   Additional Social History:                         Sleep: Fair  Appetite:  Fair  Current Medications: Current Facility-Administered Medications  Medication Dose Route Frequency Provider Last Rate Last Dose  . alum & mag hydroxide-simeth (MAALOX/MYLANTA) 200-200-20 MG/5ML suspension 30 mL  30 mL Oral Q6H PRN Jomarie Longs, MD   30 mL at 03/20/15 0949  . benztropine (COGENTIN) tablet 0.5 mg  0.5 mg Oral BID PRN Jomarie Longs, MD   0.5 mg at 03/18/15 1651  . haloperidol (HALDOL) tablet  2 mg  2 mg Oral BID Earney Navy, NP   2 mg at 03/20/15 1324  . hydrOXYzine (ATARAX/VISTARIL) tablet 25 mg  25 mg Oral TID PRN Earney Navy, NP      . methadone (DOLOPHINE) tablet 60 mg  60 mg Oral Daily Earney Navy, NP   60 mg at 03/20/15 0827  . nicotine (NICODERM CQ - dosed in mg/24 hours) patch 21 mg  21 mg Transdermal Daily Jomarie Longs, MD   21 mg at 03/20/15 0821  . sertraline (ZOLOFT) tablet 25 mg  25 mg Oral Daily Adonis Brook, NP      . traZODone (DESYREL) tablet 100 mg  100 mg Oral QHS PRN Earney Navy, NP   100 mg at 03/19/15 2248  . triamcinolone cream (KENALOG) 0.1 %   Topical BID Jomarie Longs, MD        Lab Results:  No results found for this or any previous visit (from the past 48 hour(s)).  Physical Findings: AIMS: Facial and Oral Movements Muscles of Facial Expression: None, normal Lips and Perioral Area: None, normal Jaw: None, normal Tongue: None, normal,Extremity Movements Upper (arms, wrists, hands, fingers): None, normal Lower (legs, knees, ankles, toes): None, normal, Trunk Movements Neck, shoulders, hips: None, normal, Overall Severity Severity of abnormal movements (highest score from questions above): None, normal Incapacitation due to abnormal movements: None, normal Patient's awareness of abnormal movements (rate only patient's report): No Awareness, Dental Status Current problems with teeth and/or dentures?: No Does patient usually wear dentures?: No  CIWA:  CIWA-Ar Total: 1 COWS:  COWS Total Score: 3  Musculoskeletal: Strength & Muscle Tone: within normal limits Gait & Station: normal Patient leans: N/A  Psychiatric Specialty Exam: Review of Systems  Psychiatric/Behavioral: Positive for hallucinations. The patient is nervous/anxious.   All other systems reviewed and are negative.   Blood pressure 123/54, pulse 116, temperature 98.3 F (36.8 C), temperature source Oral, resp. rate 20, height  (1.778 m), weight  117.935 kg (260 lb), SpO2 94 %.Body mass index is 37.31 kg/(m^2).  General Appearance: Fairly Groomed  Patent attorney::  Fair  Speech:  Clear and Coherent  Volume:  Normal  Mood:  Anxious improving  Affect:  Congruent  Thought Process:  Intact  Orientation:  Full (Time, Place, and Person)  Thought Content:  Delusions, Hallucinations: Auditory, Paranoid Ideation and Rumination improving   Suicidal Thoughts:  No  Homicidal Thoughts:  No  Memory:  Immediate;   Fair Recent;   Fair Remote;   Fair  Judgement:  Impaired  Insight:  Shallow  Psychomotor Activity:  Restlessness  Concentration:  Fair  Recall:  [employer] of Knowledge:Fair  Language: Fair  Akathisia:  No  Handed:  Right  AIMS (if indicated):     Assets:  Communication Skills Desire for Improvement  ADL's:  Intact  Cognition: WNL  Sleep:  Number of Hours: 5.25   Treatment Plan Summary: Daily contact with patient to assess and evaluate symptoms and progress in treatment and Medication management Will continue Haldol  5 mg po bid for psychosis. Pt with psychosis 2/2 substance (diphenhydramine) abuse. Will make Cogentin 0.5 mg po bid prn for EPS. Will continue Trazodone 100 mg po qhs for sleep. Continue  Methadone 60 mg po daily. Added Zoloft 25 mg daily. Will make available Vistaril 25 mg po q6h prn for anxiety sx. Will continue to monitor vitals ,medication compliance and treatment side effects while patient is here.  Will monitor for medical issues as well as call consult as needed.  Reviewed labs TSH- wnl , lipid panel- dyslipidemia ( will place dietician consult ), hba1c - 5.8, PL slightly elevated- pt to follow up on an out patient basis . CSW will start working on disposition.  Patient to participate in therapeutic milieu .    Velna HatchetSheila May Skylen Spiering MD 03/20/2015, 11:20 AM

## 2015-03-20 NOTE — Progress Notes (Signed)
The focus of this group is to help patients review their daily goal of treatment and discuss progress on daily workbooks. Pt attended the evening group session and responded to all discussion prompts from the Writer. Pt shared that today was a good day on the unit, the highlight of which was getting to talk with his parents on the telephone. Pt mentioned being hopeful he would be able to discharge tomorrow. [name] reported having no additional needs from Nursing Staff this evening. Pt's affect was appropriate.

## 2015-03-20 NOTE — [facility] Group Notes (Signed)
[facility] Group Notes:  (Clinical Social Work)  03/20/2015  11:15-12:00PM  Summary of Progress/Problems:   The main focus of today's process group was to discuss patients' feelings related to being hospitalized.  It was agreed in general by the group that it would be preferable to avoid future hospitalizations, and we discussed means of doing that.  As a follow-up, problems with adhering to medication recommendations were discussed.  The patient expressed his primary feeling about being hospitalized is that he needed it and it is giving him the opportunity to talk to a lot of people and come into better contact with reality.  He talked at length about his symptoms of psychosis and how troubling they are.  Type of Therapy:  Group Therapy - Process  Participation Level:  Active  Participation Quality:  Attentive, Drowsy and Sharing  Affect:  Blunted  Cognitive:  Appropriate  Insight:  Developing/Improving  Engagement in Therapy:  Developing/Improving  Modes of Intervention:  Exploration, Discussion  William MantleMareida Grossman-Orr, LCSW 03/20/2015, 12:55 PM

## 2015-03-20 NOTE — [facility] Group Notes (Signed)

## 2015-03-20 NOTE — Progress Notes (Signed)
D: Pt is alert and oriented x 4. Pt denies anxiety, depression, SI, HI and AVH.  He states, "I feel much better; I know I need to continue taking my meds when I get home; I have the seen what the difference can be." Pt bed was made, his cloths were well folded; Pt states "this is the way my room at home has to always look like. Pt continues to be nonviolent.   A: Support, encouragement, and safe environment provided.  15-minute safety checks continue.  R: Pt was med compliant.  Pt did attend wrap-up group. Safety checks continue.

## 2015-03-20 NOTE — Progress Notes (Signed)
D:  Patient's self inventory sheet, patient sleeps good, sleep medication is helpful.  Good appetite, high energy level, good concentration.  Denied depression, hopeless and anxiety.  Denied withdrawals.  Denied physical problems,  Denied SI.  Pain medication is helpful.  Goal is to stay positive and interact with staff and patients.  Plans to keep a good focused attitude.  Does have discharge plans. A:  Medications administered per MD orders.  Emotional support and encouragement given patient. R:  Denied SI and HI, contracts for safety.  Denied A/V hallucinations.  Safety maintained with 15 minute checks.

## 2015-03-20 NOTE — Progress Notes (Signed)
Patient ID: [name], male   DOB: 04-14-1981, 34 y.o.   MRN: [id number] D: Client visible on unit, seen in dayroom watching TV. Client reports his day has been "just fine" Client also reports hearing voices,"but not enough to worry about"  "making fun of me" "I talk to my dad and my mom and my little brother" A: Clinical research associateWriter provided emotional support, encouraged client to report any concerns. Staff will monitor q6415min for safety. R: Client is safe on the unit, attended group.

## 2015-03-21 MED ORDER — HALOPERIDOL 2 MG PO TABS: 2.0000 mg | ORAL_TABLET | Freq: Two times a day (BID) | ORAL | Status: DC

## 2015-03-21 MED ORDER — HYDROXYZINE HCL 25 MG PO TABS: 25.0000 mg | ORAL_TABLET | Freq: Three times a day (TID) | ORAL | Status: DC | PRN

## 2015-03-21 MED ORDER — BENZTROPINE MESYLATE 0.5 MG PO TABS: 0.5000 mg | ORAL_TABLET | Freq: Two times a day (BID) | ORAL | Status: DC | PRN

## 2015-03-21 MED ORDER — TRIAMCINOLONE ACETONIDE 0.1 % EX CREA: TOPICAL_CREAM | Freq: Two times a day (BID) | CUTANEOUS | Status: DC

## 2015-03-21 MED ORDER — SERTRALINE HCL 25 MG PO TABS: 25.0000 mg | ORAL_TABLET | Freq: Every day | ORAL | Status: DC

## 2015-03-21 MED ORDER — TRAZODONE HCL 100 MG PO TABS: 100.0000 mg | ORAL_TABLET | Freq: Every evening | ORAL | Status: DC | PRN

## 2015-03-21 NOTE — [facility] Group Notes (Addendum)
[facility] Group Notes:  (Clinical Social Work)  03/21/2015  [facility] Group Notes:  (Clinical Social Work)  03/21/2015  11:00AM-12:00PM  Summary of Progress/Problems:  The main focus of today's process group was to listen to a variety of genres of music and to identify that different types of music provoke different responses.  The patient then was able to identify personally what was soothing for them, as well as energizing.   The patient expressed understanding of concepts, as well as knowledge of how each type of music affected him and how this can be used at home as a wellness/recovery tool.  He was very active, danced a lot, but stated that his anxiety about his pending discharge was at a 5 at the beginning of group and remained the same at the end of group.  Type of Therapy:  Music Therapy   Participation Level:  Active  Participation Quality:  Attentive and Sharing  Affect:  Blunted  Cognitive:  Oriented  Insight:  Engaged  Engagement in Therapy:  Engaged  Modes of Intervention:   Activity, Exploration  Ambrose MantleMareida Grossman-Orr, LCSW 03/21/2015

## 2015-03-21 NOTE — Progress Notes (Signed)
  [facility] :  Will you be returning to the same living situation after discharge:  Yes,  home At discharge, do you have transportation home?: Yes,  father Do you have the ability to pay for your medications: Yes,  no issues  Release of information consent forms completed and in the chart;  Patient's signature needed at discharge.  Patient to Follow up at: Follow-up Information    Follow up with [facility].   Why:  Patient to resume services w this provider for methadone maintenance   Contact information:   [address],  [place], [place] [address] Phone: [phone] Fax:  [phone]      Follow up with [facility].   Why:  Please utilize walk in clinic, arrive at 8:30 - 11:30.  Be prepared to spend several hours as you will see several providers at your first visit.     Contact information:   [address]  [place] [address]  Phone:  [phone] Fax:  [phone]        Follow up with [facility]. Go on 03/23/2015.   Why:  Walk in clinic for assessment and entrance into services at this provider - Tuesdays from 8:30 AM - Noon   Contact information:   [address],  [place], [place] [address] Phone: [phone]       Patient denies SI/HI: Yes,  see doctor SRA    Safety Planning and Suicide Prevention discussed: Yes,  with patient in group setting, not needed with family     Has patient been referred to the Quitline?: N/A patient is not a smoker  Sarina SerGrossman-Orr, Silvie Obremski Jo 03/21/2015, 12:38 PM

## 2015-03-21 NOTE — [facility] Group Notes (Signed)
[facility] Group Notes:  (Nursing/MHT/Case Management/Adjunct)  Date:  03/21/2015  Time:  1:20 PM  Type of Therapy:  Psychoeducational Skills  Participation Level:  Active  Participation Quality:  Appropriate  Affect:  Appropriate  Cognitive:  Appropriate  Insight:  Appropriate  Engagement in Group:  Engaged  Modes of Intervention:  Discussion  Summary of Progress/Problems: Pt did attend self inventory group, pt reported that he was negative SI/HI, no AH/VH noted. Pt rated his depression as a 0, his anxiety 0, and his helplessness/hopelessness as a 0.     Pt reported no issues or concerns.    [name], Maryjo RochesterShalita Shanta 03/21/2015, 1:20 PM

## 2015-03-21 NOTE — Discharge Summary (Signed)
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 Hyperprolactinemia (HCC) [E22.1] 03/19/2015  . 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 Opioid use disorder, moderate, on maintenance therapy [F11.90] 03/17/2015  . Cannabis use disorder, moderate, in sustained remission [F12.90] 03/17/2015  . Psoriasis [L40.9] 03/17/2015  . Aggressive behavior of adult [F60.89]   . Opioid dependence with withdrawal (HCC) [F11.23]   . Psychoses [F29]   . Polysubstance abuse [F19.10] 04/28/2014    Musculoskeletal: Strength & Muscle Tone: within normal limits Gait & Station: normal Patient leans: N/A  Psychiatric Specialty Exam:  SEE MD SRA Physical Exam  Vitals reviewed.   Review of Systems  All other systems reviewed and are negative.   Blood pressure 128/86, pulse 92, temperature 97.7 F (36.5 C), temperature source Oral, resp. rate 20, height  (1.778 m), weight 117.935 kg (260 lb), SpO2 94 %.Body mass index is 37.31 kg/(m^2).     Has this patient used any form of tobacco in the last 30 days? (Cigarettes, Smokeless Tobacco, Cigars, and/or Pipes) N/A  Past Medical History:  Past Medical History  Diagnosis Date  . Abscess   . Heroin addiction (HCC)   . GERD (gastroesophageal reflux disease)    History reviewed. No pertinent past surgical history. Family History:  Family History  Problem Relation Age of Onset  . Mental illness  Neg Hx   . Hypertension Neg Hx    Social History:  History  Alcohol Use  . Yes    Comment: Denies any alcohol     History  Drug Use  . Yes  . Special: Other-see comments    Comment: abuses Unisom, hx of cannabis abuse, bzd abuse , stimulant abuse    Social History   Social History  . Marital Status: Single    Spouse Name: N/A  . Number of Children: N/A  . Years of Education: N/A   Social History Main Topics  . Smoking status: Current Every Day Smoker -- 1.50 packs/day for 15 years  . Smokeless tobacco: Never Used  . Alcohol Use: Yes     Comment: Denies any alcohol  . Drug Use: Yes    Special: Other-see comments     Comment: abuses Unisom, hx of cannabis abuse, bzd abuse , stimulant abuse  . Sexual Activity: No   Other Topics Concern  . 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  He was treated discharged with the medications listed below under Medication List.  Medical problems were identified and treated as needed.  Home medications were restarted as appropriate.  Improvement was monitored by observation and Venora Maples daily report of symptom reduction.  Emotional and mental status was monCHESLEY VEASEYored  by daily self-inventory reports completed by Venora Mapleshomas E Butz and clinical staff.         Venora Mapleshomas E Mcmains was evaluated by the treatment team for stability and plans for continued recovery upon discharge.  Venora Mapleshomas E Mrozek motivation was an integral factor for scheduling further treatment.  Employment, transportation, bed availability, health status, family support, and any pending legal issues were also considered during his hospital stay.  He was offered further treatment  options upon discharge including but not limited to Residential, Intensive Outpatient, and Outpatient treatment.  Venora Mapleshomas E Fatima will follow up with the services as listed below under Follow Up Information.     Upon completion of this admission the patient was both mentally and medically stable for discharge denying suicidal/homicidal ideation, auditory/visual/tactile hallucinations, delusional thoughts and paranoia.      Consults:  psychiatry  Significant Diagnostic Studies:  labs: per ED  Discharge Vitals:   Blood pressure 128/86, pulse 92, temperature 97.7 F (36.5 C), temperature source Oral, resp. rate 20, height 5\' 10"  (1.778 m), weight 117.935 kg (260 lb), SpO2 94 %. Body mass index is 37.31 kg/(m^2). Lab Results:   No results found for this or any previous visit (from the past 72 hour(s)).  Physical Findings: AIMS: Facial and Oral Movements Muscles of Facial Expression: None, normal Lips and Perioral Area: None, normal Jaw: None, normal Tongue: None, normal,Extremity Movements Upper (arms, wrists, hands, fingers): None, normal Lower (legs, knees, ankles, toes): None, normal, Trunk Movements Neck, shoulders, hips: None, normal, Overall Severity Severity of abnormal movements (highest score from questions above): None, normal Incapacitation due to abnormal movements: None, normal Patient's awareness of abnormal movements (rate only patient's report): No Awareness, Dental Status Current problems with teeth and/or dentures?: No Does patient usually wear dentures?: No  CIWA:  CIWA-Ar Total: 1 COWS:  COWS Total Score: 3   See Psychiatric Specialty Exam and Suicide Risk Assessment completed by Attending Physician prior to discharge.  Discharge destination:  Home  Is patient on multiple antipsychotic therapies at discharge:  No   Has Patient had three or more failed trials of antipsychotic monotherapy by history:  No    Recommended Plan for Multiple Antipsychotic  Therapies: NA     Medication List    STOP taking these medications        methadone 10 MG/ML solution  Commonly known as:  DOLOPHINE     triamcinolone cream 0.5 %  Commonly known as:  KENALOG  Replaced by:  triamcinolone cream 0.1 %      TAKE these medications      Indication   benztropine 0.5 MG tablet  Commonly known as:  COGENTIN  Take 1 tablet (0.5 mg total) by mouth 2 (two) times daily as needed for tremors (eps).   Indication:  Extrapyramidal Reaction caused by Medications     haloperidol 2 MG tablet  Commonly known as:  HALDOL  Take 1 tablet (2 mg total) by mouth 2 (two) times daily.   Indication:  Psychosis, Delusional thinking     hydrOXYzine 25 MG tablet  Commonly known as:  ATARAX/VISTARIL  Take 1 tablet (25 mg total) by mouth 3 (three) times daily as needed for anxiety.   Indication:  Anxiety Neurosis     sertraline 25 MG tablet  Commonly known as:  ZOLOFT  Take 1 tablet (25 mg total) by mouth daily.   Indication:  Major Depressive Disorder     traZODone 100 MG tablet  Commonly known as:  DESYREL  Take 1 tablet (100 mg total) by mouth at bedtime as needed for sleep.   Indication:  Trouble Sleeping     triamcinolone cream 0.1 %  Commonly known as:  KENALOG  Apply topically 2 (two) times daily.            Follow-up Information    Follow up with [facility].   Why:  Patient to resume services w this provider for methadone maintenance   Contact information:   [address] [place],  [place], [place] [address] Phone: [phone] Fax:  [phone]      Follow up with [facility].   Why:  Please utilize walk in clinic, arrive at 8:30 - 11:30.  Be prepared to spend several hours as you will see several providers at your first visit.     Contact information:   [address]  [place] [address]  Phone:  [phone] Fax:  [phone]        Follow up with [facility]. Go on 03/23/2015.   Why:  Walk in clinic for assessment and entrance into services at this provider -  Tuesdays from 8:30 AM - Noon   Contact information:   [address],  [place], [place] [address] Phone: [phone]       Follow-up recommendations:  Activity:  as tol Diet:  AS TOL  Comments:  1.  Take all your medications as prescribed.              2.  Report any adverse side effects to outpatient provider.                       3.  Patient instructed to not use alcohol or illegal drugs while on prescription medicines.            4.  In the event of worsening symptoms, instructed patient to call 911, the crisis hotline or go to nearest emergency room for evaluation of symptoms.  Total Discharge Time:  30 min  Signed: Velna Hatchet May Agustin AGNP-BC 03/21/2015, 2:17 PM  I personally assessed the patient and formulated the plan Madie Reno A. Dub Mikes, M.D.

## 2015-03-21 NOTE — Progress Notes (Signed)
Patient ID: [name], male   DOB: 1980/08/23, 34 y.o.   MRN: [id number]   Pt discharged home with his father,pt reported that he was ready for discharge. Pt reported that his depression was a 0, his hopelessness was a 0, and his anxiety was a 0. Pt reported that he was negative SI/HI, and no AH/VH noted. Pt reported that he was going to continue to have a positive attitude. Pt was discharged with belongings, prescriptions, and his discharge instructions.

## 2015-03-21 NOTE — BHH Suicide Risk Assessment (Signed)
[facility] Discharge Suicide Risk Assessment   Demographic Factors:  Male and Caucasian  Total Time spent with patient: 30 minutes  Musculoskeletal: Strength & Muscle Tone: within normal limits Gait & Station: normal Patient leans: normal  Psychiatric Specialty Exam: Physical Exam  Review of Systems  Constitutional: Negative.   HENT: Negative.   Eyes: Negative.   Respiratory:       2 packs   Cardiovascular: Negative.   Gastrointestinal: Positive for heartburn.  Genitourinary: Negative.   Musculoskeletal: Negative.   Skin: Negative.   Neurological: Negative.   Endo/Heme/Allergies: Negative.   Psychiatric/Behavioral: Positive for substance abuse.    Blood pressure 128/86, pulse 92, temperature 97.7 F (36.5 C), temperature source Oral, resp. rate 20, height  (1.778 m), weight 117.935 kg (260 lb), SpO2 94 %.Body mass index is 37.31 kg/(m^2).  General Appearance: Fairly Groomed  Patent attorney::  Fair  Speech:  Clear and Coherent409  Volume:  Normal  Mood:  Euthymic  Affect:  Appropriate  Thought Process:  Coherent and Goal Directed  Orientation:  Full (Time, Place, and Person)  Thought Content:  plans as he moves on   Suicidal Thoughts:  No  Homicidal Thoughts:  No  Memory:  Immediate;   Fair Recent;   Fair Remote;   Fair  Judgement:  Fair  Insight:  Present  Psychomotor Activity:  Restlessness  Concentration:  Fair  Recall:  [employer] of Knowledge:Fair  Language: Fair  Akathisia:  No  Handed:  Right  AIMS (if indicated):     Assets:  Desire for Improvement Social Support  Sleep:  Number of Hours: 525  Cognition: WNL  ADL's:  Intact      Has this patient used any form of tobacco in the last 30 days? (Cigarettes, Smokeless Tobacco, Cigars, and/or Pipes) Yes, A prescription for an FDA-approved tobacco cessation medication was offered at discharge and the patient refused  Mental Status Per Nursing Assessment::   On Admission:     Current Mental Status by  Physician: In full contact with reality. There are no active SI plans or intent. States he feels much better, has not heard any voices. States the Haldol has worked well for him plans to continue to take it. He is going to [facility] for the methadone tomorrow AM   Loss Factors: NA  Historical Factors: NA  Risk Reduction Factors:   Living with another person, especially a relative and Positive social support  Continued Clinical Symptoms:  Alcohol/Substance Abuse/Dependencies  Cognitive Features That Contribute To Risk:  Closed-mindedness, Polarized thinking and Thought constriction (tunnel vision)    Suicide Risk:  Minimal: No identifiable suicidal ideation.  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 Hyperprolactinemia (HCC) [E22.1] 03/19/2015  . 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 Opioid use disorder, moderate, on maintenance therapy [F11.90] 03/17/2015  . Cannabis use disorder, moderate, in sustained remission [F12.90] 03/17/2015  . Psoriasis [L40.9] 03/17/2015  . Aggressive behavior of adult [F60.89]   . Opioid dependence with withdrawal (HCC) [F11.23]   . Psychoses [F29]   . Polysubstance abuse [F19.10] 04/28/2014    Follow-up Information    Follow up with [facility].   Why:  Patient to resume services w  this provider for methadone maintenance   Contact information:   [address],  [place], [place] [address] Phone: [phone] Fax:  [phone]      [facility].   Why:  Please utilize walk in clinic, arrive at 8:30 - 11:30.  Be prepared to spend several hours as you will see several  providers at your first visit.     Contact information:   [address]  [place] [address]  Phone:  [phone] Fax:  [phone]        Follow up with [facility]. Go on 03/23/2015.   Why:  Walk in clinic for assessment and entrance into services at this provider - Tuesdays from 8:30 AM - Noon   Contact information:   [address],  [place], [place] [address] Phone: [phone]       Plan Of Care/Follow-up recommendations:  Activity:  as tolerated Diet:  regular Follow up as above Is patient on multiple antipsychotic therapies at discharge:  No   Has Patient had three or more failed trials of antipsychotic monotherapy by history:  No  Recommended Plan for Multiple Antipsychotic Therapies: NA    Neven Fina A 03/21/2015, 1:41 PM

## 2015-04-23 ENCOUNTER — Encounter (HOSPITAL_COMMUNITY): Payer: Self-pay | Admitting: *Deleted

## 2015-04-23 ENCOUNTER — Emergency Department (HOSPITAL_COMMUNITY)
Admission: EM | Admit: 2015-04-23 | Discharge: 2015-04-23 | Disposition: A | Discharge status: Home / Self Care | Payer: Self-pay | Attending: Emergency Medicine | Admitting: Emergency Medicine

## 2015-04-23 DIAGNOSIS — Z8719 Personal history of other diseases of the digestive system: Secondary | ICD-10-CM | POA: Insufficient documentation

## 2015-04-23 DIAGNOSIS — R440 Auditory hallucinations: Secondary | ICD-10-CM

## 2015-04-23 DIAGNOSIS — Z872 Personal history of diseases of the skin and subcutaneous tissue: Secondary | ICD-10-CM | POA: Insufficient documentation

## 2015-04-23 DIAGNOSIS — Z79899 Other long term (current) drug therapy: Secondary | ICD-10-CM | POA: Insufficient documentation

## 2015-04-23 DIAGNOSIS — F17200 Nicotine dependence, unspecified, uncomplicated: Secondary | ICD-10-CM | POA: Insufficient documentation

## 2015-04-23 DIAGNOSIS — Z7952 Long term (current) use of systemic steroids: Secondary | ICD-10-CM | POA: Insufficient documentation

## 2015-04-23 LAB — COMPREHENSIVE METABOLIC PANEL
ALT: 21 U/L (ref 17–63)
ANION GAP: 13 (ref 5–15)
AST: 21 U/L (ref 15–41)
Albumin: 4.5 g/dL (ref 3.5–5.0)
Alkaline Phosphatase: 69 U/L (ref 38–126)
BILIRUBIN TOTAL: 0.5 mg/dL (ref 0.3–1.2)
BUN: 10 mg/dL (ref 6–20)
CALCIUM: 9.2 mg/dL (ref 8.9–10.3)
CHLORIDE: 101 mmol/L (ref 101–111)
CO2: 25 mmol/L (ref 22–32)
Creatinine, Ser: 0.7 mg/dL (ref 0.61–1.24)
Glucose, Bld: 121 mg/dL — ABNORMAL HIGH (ref 65–99)
POTASSIUM: 3.1 mmol/L — AB (ref 3.5–5.1)
Sodium: 139 mmol/L (ref 135–145)
TOTAL PROTEIN: 8.4 g/dL — AB (ref 6.5–8.1)

## 2015-04-23 LAB — CBC
HCT: 46.4 % (ref 39.0–52.0)
HEMOGLOBIN: 15.9 g/dL (ref 13.0–17.0)
MCH: 31.8 pg (ref 26.0–34.0)
MCHC: 34.3 g/dL (ref 30.0–36.0)
MCV: 92.8 fL (ref 78.0–100.0)
PLATELETS: 293 10*3/uL (ref 150–400)
RBC: 5 MIL/uL (ref 4.22–5.81)
RDW: 14.6 % (ref 11.5–15.5)
WBC: 17.7 10*3/uL — ABNORMAL HIGH (ref 4.0–10.5)

## 2015-04-23 LAB — ETHANOL: Alcohol, Ethyl (B): <5 mg/dL (ref <5)

## 2015-04-23 MED ORDER — POTASSIUM CHLORIDE CRYS ER 20 MEQ PO TBCR
40.0000 meq | EXTENDED_RELEASE_TABLET | Freq: Once | ORAL | Status: AC
  Administered 2015-04-23: 40 meq via ORAL
  Filled 2015-04-23: qty 2

## 2015-04-23 NOTE — ED Notes (Signed)
Pt complains of a "cringing" feeling all over his body since he stop taking methadone 8 days ago. Pt states he was seen at behavioral health last month due to hearing voices. Pt states he stopped hearing voices until 1 week ago. Pt states the voices engage in normal conversation. Pt denies HI/SI. Pt states he took suboxone last night and states "I felt great and normal" and did not hear voices

## 2015-04-23 NOTE — BH Assessment (Signed)
Consulted with Nanine MeansJamison Lord, DNP who recommends patient be discharged and [facility].  Informed patient and EDP of recommendation.   Davina PokeJoVea Chastelyn Athens, LCSW Therapeutic Triage Specialist [facility] 04/23/2015 3:15 PM

## 2015-04-23 NOTE — ED Provider Notes (Addendum)
CSN: [id number]     Arrival date & time 04/23/15  1219 History   First MD Initiated Contact with Patient 04/23/15 1402     Chief Complaint  Patient presents with  . Hallucinations     (Consider location/radiation/quality/duration/timing/severity/associated sxs/prior Treatment) HPI..... Patient with long-standing mental health issues presents with auditory hallucinations today. He had been on methadone 60 mg daily, but none past 10 days. He felt tremulous today while driving. He pulled into a restaurant and called his father. His father brought him to the emergency department. No suicidal or homicidal ideation.  Past Medical History  Diagnosis Date  . Abscess   . Heroin addiction (HCC)   . GERD (gastroesophageal reflux disease)    History reviewed. No pertinent past surgical history. Family History  Problem Relation Age of Onset  . Mental illness Neg Hx   . Hypertension Neg Hx    Social History  Substance Use Topics  . Smoking status: Current Every Day Smoker -- 1.50 packs/day for 15 years  . Smokeless tobacco: Never Used  . Alcohol Use: Yes     Comment: Denies any alcohol    Review of Systems  All other systems reviewed and are negative.     Allergies  Review of patient's allergies indicates no known allergies.  Home Medications   Prior to Admission medications   Medication Sig Start Date End Date Taking? Authorizing Provider  benztropine (COGENTIN) 0.5 MG tablet Take 1 tablet (0.5 mg total) by mouth 2 (two) times daily as needed for tremors (eps). 03/21/15  Yes Adonis Brook, NP  doxylamine, Sleep, (UNISOM) 25 MG tablet Take 100-125 mg by mouth at bedtime as needed for sleep.   Yes Historical Provider, MD  lurasidone (LATUDA) 40 MG TABS tablet Take 40 mg by mouth daily with breakfast.   Yes Historical Provider, MD  methadone (DOLOPHINE) 10 MG/5ML solution Take 60 mg by mouth daily.   Yes Historical Provider, MD  sertraline (ZOLOFT) 25 MG tablet Take 1 tablet (25 mg  total) by mouth daily. 03/21/15  Yes Adonis Brook, NP  traZODone (DESYREL) 100 MG tablet Take 1 tablet (100 mg total) by mouth at bedtime as needed for sleep. 03/21/15  Yes Adonis Brook, NP  triamcinolone cream (KENALOG) 0.1 % Apply topically 2 (two) times daily. Patient taking differently: Apply 1 application topically 2 (two) times daily.  03/21/15  Yes Adonis Brook, NP  haloperidol (HALDOL) 2 MG tablet Take 1 tablet (2 mg total) by mouth 2 (two) times daily. Patient not taking: Reported on 04/23/2015 03/21/15   Adonis Brook, NP  hydrOXYzine (ATARAX/VISTARIL) 25 MG tablet Take 1 tablet (25 mg total) by mouth 3 (three) times daily as needed for anxiety. 03/21/15   Adonis Brook, NP   BP 143/100 mmHg  Pulse 110  Temp(Src) 98.3 F (36.8 C) (Oral)  Resp 18  SpO2 97% Physical Exam  Constitutional: He is oriented to person, place, and time.  No psychosis  HENT:  Head: Normocephalic and atraumatic.  Eyes: Conjunctivae and EOM are normal. Pupils are equal, round, and reactive to light.  Neck: Normal range of motion. Neck supple.  Cardiovascular: Normal rate and regular rhythm.   Pulmonary/Chest: Effort normal and breath sounds normal.  Abdominal: Soft. Bowel sounds are normal.  Musculoskeletal: Normal range of motion.  Neurological: He is alert and oriented to person, place, and time.  Skin: Skin is warm and dry.  Psychiatric: He has a normal mood and affect. His behavior is normal.  Nursing note and  vitals reviewed.   ED Course  Procedures (including critical care time) Labs Review Labs Reviewed  COMPREHENSIVE METABOLIC PANEL - Abnormal; Notable for the following:    Potassium 3.1 (*)    Glucose, Bld 121 (*)    Total Protein 8.4 (*)    All other components within normal limits  CBC - Abnormal; Notable for the following:    WBC 17.7 (*)    All other components within normal limits  ETHANOL  URINE RAPID DRUG SCREEN, HOSP PERFORMED    Imaging Review No results  found. I have personally reviewed and evaluated these images and lab results as part of my medical decision-making.   EKG Interpretation None      MDM   Final diagnoses:  Auditory hallucination    Patient appears stable. Will obtain behavioral consult.  1520   Behavioral health consult obtained. Patient can follow-up with outpatient mental health counseling  Donnetta HutchingBrian Triva Hueber, MD 04/23/15 1506  Donnetta HutchingBrian Jet Armbrust, MD 04/23/15 1520

## 2015-04-23 NOTE — Discharge Instructions (Signed)
Follow-up with [facility] resources[facility]

## 2015-04-23 NOTE — BH Assessment (Addendum)
Assessment Note  [name] is an 34 y.o. male who presents to [facility] voluntarily with his father to [facility]. Patient states that he asked his father to bring him to the ED after feeling "weird" but feels "fine" now. Patient states that he has been on Methadone and ran out of money and has not been taking it for the past ten days. patient states that he was prescribed Cogentin by [facility] and he has been taking it two times per day for the past two days.  Patient states that he took suboxone yesterday and injected Roxy (15 mg) today. Patient states that he started to feel "funny" stating,  "I couldn't hardly move, I couldn't make a sentence make sense hardly, I could only get four words out, I had to pull over and get my pops to bring me over here." Patient states that he feels "normal" and "stable" now. Patient states "if I would have felt like this all day I wouldn't have came." Patient states that he is not sure why he feels good now compared to earlier.  Patient contracts for safety at this time. Patient denies SI and previous attempts. Patient denies previous self injurious behavior. Patient denies HI and history of harm to others. Patient states that he has auditory hallucinations that "pump me up" and "say yea you can do this, go get it." Patient denies hearing voices at this time and states that he "feels better." Patient denies current SI/HI and AVH.   Consulted with Nanine Means, DNP who recommends patient be discharged.  Informed EDP of recommendation who agrees with disposition.   Diagnosis: Adjustment Disorder  Past Medical History:  Past Medical History  Diagnosis Date  . Abscess   . Heroin addiction (HCC)   . GERD (gastroesophageal reflux disease)     History reviewed. No pertinent past surgical history.  Family History:  Family History  Problem Relation Age of Onset  . Mental illness Neg Hx   . Hypertension Neg Hx     Social History:  reports that he has been smoking.  He has  never used smokeless tobacco. He reports that he drinks alcohol. He reports that he uses illicit drugs (Other-see comments).  Additional Social History:  Alcohol / Drug Use Pain Medications: See PTA Prescriptions: See PTA Over the Counter: See PTA Substance #1 Name of Substance 1: Heroin 1 - Age of First Use: 32 1 - Amount (size/oz): 5-10 bags 1 - Frequency: daily 1 - Duration: 2 years 1 - Last Use / Amount: UKN  CIWA: CIWA-Ar BP: 143/100 mmHg Pulse Rate: 110 COWS:    Allergies: No Known Allergies  Home Medications:  (Not in a hospital admission)  OB/GYN Status:  No LMP for male patient.  General Assessment Data [facility] of Assessment: [facility] TTS Assessment: In system Is this a Tele or Face-to-Face Assessment?: Face-to-Face Is this an Initial Assessment or a Re-assessment for this encounter?: Initial Assessment Marital status: Single Is patient pregnant?: No Pregnancy Status: No Living Arrangements: Parent (father) Can pt return to current living arrangement?: Yes Admission Status: Voluntary Is patient capable of signing voluntary admission?: No Referral Source: Self/Family/Friend (Father) Insurance type: None     Crisis Care Plan Living Arrangements: Parent (father) Name of Psychiatrist: Vesta Mixer (picked up medications yesterday) Name of Therapist: [facility]  Education Status Is patient currently in school?: No Highest grade of school patient has completed: 12  Risk to self with the past 6 months Suicidal Ideation: No Has patient been a  risk to self within the past 6 months prior to admission? : No Suicidal Intent: No Has patient had any suicidal intent within the past 6 months prior to admission? : Other (comment) ("I thought about it but I never reacted to it") Is patient at risk for suicide?: No Suicidal Plan?: No Has patient had any suicidal plan within the past 6 months prior to admission? : No Access to Means: No What has been your use of drugs/alcohol  within the last 12 months?: Methadone and roxy Previous Attempts/Gestures: No How many times?: 0 Other Self Harm Risks: Denies Triggers for Past Attempts: None known Intentional Self Injurious Behavior: None Family Suicide History: No Recent stressful life event(s): Other (Comment) ("coming off methadone") Persecutory voices/beliefs?: No Depression: Yes Depression Symptoms: Insomnia, Tearfulness, Isolating, Fatigue, Loss of interest in usual pleasures Substance abuse history and/or treatment for substance abuse?: Yes Suicide prevention information given to non-admitted patients: Not applicable  Risk to Others within the past 6 months Homicidal Ideation: No Does patient have any lifetime risk of violence toward others beyond the six months prior to admission? : No Thoughts of Harm to Others: No Current Homicidal Intent: No Current Homicidal Plan: No Access to Homicidal Means: No Identified Victim: Denies History of harm to others?: No Assessment of Violence: None Noted Violent Behavior Description: Denies Does patient have access to weapons?: No Criminal Charges Pending?: No Does patient have a court date: No Is patient on probation?: No  Psychosis Hallucinations: Auditory ("voices picking on me" x1 week)  Mental Status Report Appearance/Hygiene: In scrubs Eye Contact: Good Motor Activity: Freedom of movement Speech: Logical/coherent Level of Consciousness: Alert Mood: Anxious Affect: Anxious Anxiety Level: Moderate Thought Processes: Coherent, Relevant Judgement: Unimpaired Orientation: Person, Place, Time, Situation, Appropriate for developmental age Obsessive Compulsive Thoughts/Behaviors: None  Cognitive Functioning Concentration: Decreased Memory: Recent Intact, Remote Intact IQ: Average Insight: Poor Impulse Control: Unable to Assess Appetite: Good Sleep: Decreased Total Hours of Sleep: 3 Vegetative Symptoms: None  ADLScreening [facility] Assessment  Services) Patient's cognitive ability adequate to safely complete daily activities?: Yes Patient able to express need for assistance with ADLs?: Yes Independently performs ADLs?: Yes (appropriate for developmental age)  Prior Inpatient Therapy Prior Inpatient Therapy: Yes Prior Therapy Dates: 2015, 10/16 [facility] Reason for Treatment: SA and hallucinations  Prior Outpatient Therapy Prior Outpatient Therapy: Yes Prior Therapy Dates: Ongoing Prior Therapy Facilty/Elkin Belfield(s): [facility] Does patient have an ACCT team?: No Does patient have Intensive In-House Services?  : No Does patient have [facility] services? : No Does patient have P4CC services?: No  ADL Screening (condition at time of admission) Patient's cognitive ability adequate to safely complete daily activities?: Yes Is the patient deaf or have difficulty hearing?: No Does the patient have difficulty seeing, even when wearing glasses/contacts?: No Does the patient have difficulty concentrating, remembering, or making decisions?: No Patient able to express need for assistance with ADLs?: Yes Does the patient have difficulty dressing or bathing?: No Independently performs ADLs?: Yes (appropriate for developmental age) Does the patient have difficulty walking or climbing stairs?: No Weakness of Legs: None Weakness of Arms/Hands: None  Home Assistive Devices/Equipment Home Assistive Devices/Equipment: None  Therapy Consults (therapy consults require a physician order) PT Evaluation Needed: No OT Evalulation Needed: No SLP Evaluation Needed: No Abuse/Neglect Assessment (Assessment to be complete while patient is alone) Physical Abuse: Denies Verbal Abuse: Denies Sexual Abuse: Denies Exploitation of patient/patient's resources: Denies Self-Neglect: Denies Values / Beliefs Cultural Requests During Hospitalization: None Spiritual Requests  During Hospitalization: None Consults Spiritual Care  Consult Needed: No Social Work Consult Needed: No Merchant navy officer (For Healthcare) Does patient have an advance directive?: No Would patient like information on creating an advanced directive?: No - patient declined information    Additional Information 1:1 In Past 12 Months?: No CIRT Risk: No Elopement Risk: No Does patient have medical clearance?: Yes     Disposition:  Disposition Initial Assessment Completed for this Encounter: Yes Disposition of Patient: Outpatient treatment Type of outpatient treatment: Adult Other disposition(s): To current [facility] Patient referred to: Other (Comment) ([facility])  On Site Evaluation by:   Reviewed with Physician:    JoVea Herbin 04/23/2015 3:01 PM

## 2015-10-05 ENCOUNTER — Emergency Department (HOSPITAL_COMMUNITY)
Admission: EM | Admit: 2015-10-05 | Discharge: 2015-10-06 | Disposition: A | Discharge status: Other Acute Inpatient Hospital | Payer: Self-pay | Attending: Emergency Medicine | Admitting: Emergency Medicine

## 2015-10-05 ENCOUNTER — Encounter (HOSPITAL_COMMUNITY): Payer: Self-pay | Admitting: Emergency Medicine

## 2015-10-05 DIAGNOSIS — Z79899 Other long term (current) drug therapy: Secondary | ICD-10-CM | POA: Insufficient documentation

## 2015-10-05 DIAGNOSIS — K219 Gastro-esophageal reflux disease without esophagitis: Secondary | ICD-10-CM | POA: Insufficient documentation

## 2015-10-05 DIAGNOSIS — F410 Panic disorder [episodic paroxysmal anxiety] without agoraphobia: Secondary | ICD-10-CM | POA: Insufficient documentation

## 2015-10-05 DIAGNOSIS — F17210 Nicotine dependence, cigarettes, uncomplicated: Secondary | ICD-10-CM | POA: Insufficient documentation

## 2015-10-05 DIAGNOSIS — R45851 Suicidal ideations: Secondary | ICD-10-CM | POA: Insufficient documentation

## 2015-10-05 DIAGNOSIS — R440 Auditory hallucinations: Secondary | ICD-10-CM | POA: Insufficient documentation

## 2015-10-05 LAB — CBC
HCT: 45.3 % (ref 39.0–52.0)
Hemoglobin: 15.6 g/dL (ref 13.0–17.0)
MCH: 30.4 pg (ref 26.0–34.0)
MCHC: 34.4 g/dL (ref 30.0–36.0)
MCV: 88.3 fL (ref 78.0–100.0)
Platelets: 254 10*3/uL (ref 150–400)
RBC: 5.13 MIL/uL (ref 4.22–5.81)
RDW: 13.8 % (ref 11.5–15.5)
WBC: 14.6 10*3/uL — AB (ref 4.0–10.5)

## 2015-10-05 NOTE — BH Assessment (Signed)
Assessment Note  [name] is an 35 y.o. male that is a walk in at [facility].  Patient reports that he took 13 unisom sleeping pills two days ago in an attempt to kill himself.   Patient reports increased depression associated with his inability to stop abusing heroine.   Patient repots that he has been abusing heroine since the age of 75.  Patient reports that he has severe panic attacks if he thinks that he will not be able to have heroine.  Patient reports that his longest period of sobriety was for 2 years.  Patient reports  Withdrawal symptoms.  Patient reports that his last use was yesterday. Patient repots that he only uses when he gets paid and he does not remember how much he uses.  Patient reports that he has been hearing voices belittling him.     Patient reports that he has been prescribed psychiatric medication from [facility] but he does not believe that it is working.  Patient reports that he has been compliant with taking the medication.  Patient denies physical, sexual or emotional abuse.  Patient  Denies HI.    Diagnosis: Major Depressive Disorder; Anxiety Disorder Opiate Abuse, Severe  Past Medical History:  Past Medical History  Diagnosis Date  . Abscess   . Heroin addiction (HCC)   . GERD (gastroesophageal reflux disease)     No past surgical history on file.  Family History:  Family History  Problem Relation Age of Onset  . Mental illness Neg Hx   . Hypertension Neg Hx     Social History:  reports that he has been smoking.  He has never used smokeless tobacco. He reports that he drinks alcohol. He reports that he uses illicit drugs (Other-see comments).  Additional Social History:  Alcohol / Drug Use History of alcohol / drug use?: Yes Longest period of sobriety (when/how long): 2 years clean Negative Consequences of Use: Financial, Personal relationships, Work / School Withdrawal Symptoms: Tingling, Fever / Chills, Weakness Substance #1 Name of Substance 1:  Heroine 1 - Age of First Use: 30 1 - Amount (size/oz): varies 1 - Frequency: weekly on his pay days 1 - Duration: 3 years 1 - Last Use / Amount: Yesterday when he used $40.00 worth of heroine  CIWA:   COWS:    Allergies: No Known Allergies  Home Medications:  (Not in a hospital admission)  OB/GYN Status:  No LMP for male patient.  General Assessment Data Location of Assessment: [facility] Services (Walk in at [facility]) TTS Assessment: In system Is this a Tele or Face-to-Face Assessment?: Face-to-Face Is this an Initial Assessment or a Re-assessment for this encounter?: Initial Assessment Marital status: Single Maiden name: NA Is patient pregnant?: No Pregnancy Status: No Living Arrangements: Parent Can pt return to current living arrangement?: Yes Admission Status: Voluntary Is patient capable of signing voluntary admission?: Yes Referral Source: Self/Family/Friend Insurance type: Self Pay      Crisis Care Plan Living Arrangements: Parent Legal Guardian:  (NA) Name of Psychiatrist: [facility]  Name of Therapist: [facility]   Education Status Is patient currently in school?: No Current Grade: NA Highest grade of school patient has completed: NA Name of school: NA Contact person: NA  Risk to self with the past 6 months Suicidal Ideation: Yes-Currently Present Has patient been a risk to self within the past 6 months prior to admission? : No Suicidal Intent: Yes-Currently Present Has patient had any suicidal intent within the past 6 months prior  to admission? : Yes Is patient at risk for suicide?: Yes Suicidal Plan?: No Has patient had any suicidal plan within the past 6 months prior to admission? : Yes Specify Current Suicidal Plan: None Reported  Access to Means: Yes Specify Access to Suicidal Means: Sleeping pills What has been your use of drugs/alcohol within the last 12 months?: Heroine Previous Attempts/Gestures: Yes How many times?: 1 Other Self Harm Risks:  None Reported Triggers for Past Attempts: Unpredictable (Substance Abuse) Intentional Self Injurious Behavior: None Family Suicide History: No Recent stressful life event(s): Financial Problems, Other (Comment) (Substance Abuse) Persecutory voices/beliefs?: Yes Depression: Yes Depression Symptoms: Despondent, Tearfulness, Fatigue, Guilt, Loss of interest in usual pleasures, Feeling worthless/self pity Substance abuse history and/or treatment for substance abuse?: Yes Suicide prevention information given to non-admitted patients: Yes  Risk to Others within the past 6 months Homicidal Ideation: No Does patient have any lifetime risk of violence toward others beyond the six months prior to admission? : No Thoughts of Harm to Others: No Current Homicidal Intent: No Current Homicidal Plan: No Access to Homicidal Means: No Identified Victim: NA History of harm to others?: No Assessment of Violence: None Noted Violent Behavior Description: None Reported Does patient have access to weapons?: Yes (Comment) Criminal Charges Pending?: No Does patient have a court date: No Is patient on probation?: No  Psychosis Hallucinations: Auditory Delusions: None noted  Mental Status Report Appearance/Hygiene: Disheveled Eye Contact: Fair Motor Activity: Freedom of movement, Restlessness Speech: Logical/coherent, Soft, Slow Level of Consciousness: Alert Mood: Anxious, Suspicious, Despair, Worthless, low self-esteem Affect: Anxious, Blunted, Depressed Anxiety Level: Minimal Thought Processes: Coherent, Relevant Judgement: Impaired Orientation: Person, Place, Time, Situation Obsessive Compulsive Thoughts/Behaviors: None  Cognitive Functioning Concentration: Decreased Memory: Recent Intact, Remote Intact IQ: Average Insight: Fair Impulse Control: Good Appetite: Good Weight Loss: 0 Weight Gain: 0 Sleep: Decreased Total Hours of Sleep: 4 Vegetative Symptoms: Decreased grooming, Not  bathing, Staying in bed  ADLScreening [facility] Assessment Services) Patient's cognitive ability adequate to safely complete daily activities?: Yes Patient able to express need for assistance with ADLs?: Yes Independently performs ADLs?: Yes (appropriate for developmental age)  Prior Inpatient Therapy Prior Inpatient Therapy: No Prior Therapy Dates: NA Prior Therapy Facilty/Provider(s): NA Reason for Treatment: NA  Prior Outpatient Therapy Prior Outpatient Therapy: Yes Prior Therapy Dates: Ongonig  Prior Therapy Facilty/Provider(s): [facility] Reason for Treatment: Medication Management and OPT Does patient have an ACCT team?: No Does patient have Intensive In-House Services?  : No Does patient have [facility] services? : Yes Does patient have P4CC services?: No  ADL Screening (condition at time of admission) Patient's cognitive ability adequate to safely complete daily activities?: Yes Is the patient deaf or have difficulty hearing?: No Does the patient have difficulty seeing, even when wearing glasses/contacts?: No Does the patient have difficulty concentrating, remembering, or making decisions?: No Patient able to express need for assistance with ADLs?: Yes Does the patient have difficulty dressing or bathing?: No Independently performs ADLs?: Yes (appropriate for developmental age) Does the patient have difficulty walking or climbing stairs?: No Weakness of Legs: None Weakness of Arms/Hands: None  Home Assistive Devices/Equipment Home Assistive Devices/Equipment: None    Abuse/Neglect Assessment (Assessment to be complete while patient is alone) Physical Abuse: Denies Verbal Abuse: Denies Sexual Abuse: Denies Exploitation of patient/patient's resources: Denies Self-Neglect: Denies Values / Beliefs Cultural Requests During Hospitalization: None Spiritual Requests During Hospitalization: None Consults Spiritual Care Consult Needed: No Social Work Consult Needed: No Dispensing optician (For Healthcare) Does patient  have an advance directive?: No Would patient like information on creating an advanced directive?: No - patient declined information    Additional Information 1:1 In Past 12 Months?: No CIRT Risk: No Elopement Risk: No Does patient have medical clearance?: Yes     Disposition: Per Karleen HampshireSpencer, PA - patient meets criteria for inpatient hospitalization.  Per Karleen HampshireSpencer patient needs to be medically cleared.  [facility]  Patient signed voluntary consent form.   Disposition Initial Assessment Completed for this Encounter: Yes Disposition of Patient: Inpatient treatment program  On Site Evaluation by:   Reviewed with Physician:    Phillip HealStevenson, Niels Cranshaw [place] 10/05/2015 9:28 PM

## 2015-10-05 NOTE — BH Assessment (Signed)
Per Donell SievertSpencer Simon, PA - patient meets criteria for inpatient hospitalization.   Per Donell SievertSpencer Simon, PA -  patient needs to be medically cleared. [facility]Tori[facility]. Patient signed voluntary consent form.

## 2015-10-05 NOTE — ED Notes (Signed)
Pt states he has been having panic attacks that have been so bad he has had to leave work early  Pt states these panic attacks last 6 to 8 hrs  Pt states he is feeling suicidal states that because they are so bad he just wants to "do myself in"   Pt states two nights ago he took 6549 unisom and still woke up the next day  Pt states he has been having audible hallucinations  Pt states he has been using heroin, cocaine, and xanax

## 2015-10-06 ENCOUNTER — Inpatient Hospital Stay (HOSPITAL_COMMUNITY)
Admission: EM | Admit: 2015-10-06 | Discharge: 2015-10-08 | DRG: 897 | Disposition: A | Discharge status: Home / Self Care | Payer: No Typology Code available for payment source | Source: Intra-hospital | Attending: Psychiatry | Admitting: Psychiatry

## 2015-10-06 ENCOUNTER — Encounter (HOSPITAL_COMMUNITY): Payer: Self-pay | Admitting: *Deleted

## 2015-10-06 DIAGNOSIS — F19951 Other psychoactive substance use, unspecified with psychoactive substance-induced psychotic disorder with hallucinations: Secondary | ICD-10-CM

## 2015-10-06 DIAGNOSIS — F1123 Opioid dependence with withdrawal: Principal | ICD-10-CM | POA: Diagnosis present

## 2015-10-06 DIAGNOSIS — F1120 Opioid dependence, uncomplicated: Secondary | ICD-10-CM | POA: Diagnosis present

## 2015-10-06 DIAGNOSIS — R45851 Suicidal ideations: Secondary | ICD-10-CM | POA: Diagnosis present

## 2015-10-06 DIAGNOSIS — F19251 Other psychoactive substance dependence with psychoactive substance-induced psychotic disorder with hallucinations: Secondary | ICD-10-CM

## 2015-10-06 DIAGNOSIS — F17210 Nicotine dependence, cigarettes, uncomplicated: Secondary | ICD-10-CM | POA: Diagnosis present

## 2015-10-06 DIAGNOSIS — F329 Major depressive disorder, single episode, unspecified: Secondary | ICD-10-CM | POA: Diagnosis present

## 2015-10-06 DIAGNOSIS — F19929 Other psychoactive substance use, unspecified with intoxication, unspecified: Secondary | ICD-10-CM | POA: Diagnosis present

## 2015-10-06 LAB — COMPREHENSIVE METABOLIC PANEL
ALK PHOS: 69 U/L (ref 38–126)
ALT: 11 U/L — AB (ref 17–63)
AST: 15 U/L (ref 15–41)
Albumin: 3.9 g/dL (ref 3.5–5.0)
Anion gap: 11 (ref 5–15)
BILIRUBIN TOTAL: 0.7 mg/dL (ref 0.3–1.2)
BUN: 11 mg/dL (ref 6–20)
CALCIUM: 9.2 mg/dL (ref 8.9–10.3)
CHLORIDE: 100 mmol/L — AB (ref 101–111)
CO2: 27 mmol/L (ref 22–32)
CREATININE: 0.9 mg/dL (ref 0.61–1.24)
Glucose, Bld: 107 mg/dL — ABNORMAL HIGH (ref 65–99)
Potassium: 3.7 mmol/L (ref 3.5–5.1)
Sodium: 138 mmol/L (ref 135–145)
TOTAL PROTEIN: 7.9 g/dL (ref 6.5–8.1)

## 2015-10-06 LAB — RAPID URINE DRUG SCREEN, HOSP PERFORMED
AMPHETAMINES: NOT DETECTED
BENZODIAZEPINES: POSITIVE — AB
Barbiturates: NOT DETECTED
Cocaine: POSITIVE — AB
Opiates: POSITIVE — AB
Tetrahydrocannabinol: POSITIVE — AB

## 2015-10-06 LAB — ETHANOL

## 2015-10-06 LAB — ACETAMINOPHEN LEVEL: Acetaminophen (Tylenol), Serum: <10 ug/mL — ABNORMAL LOW (ref 10–30)

## 2015-10-06 LAB — SALICYLATE LEVEL

## 2015-10-06 MED ORDER — NICOTINE 21 MG/24HR TD PT24
21.0000 mg | MEDICATED_PATCH | Freq: Every day | TRANSDERMAL | Status: DC
  Administered 2015-10-06 – 2015-10-07 (×2): 21 mg via TRANSDERMAL
  Filled 2015-10-06 (×5): qty 1

## 2015-10-06 MED ORDER — LORAZEPAM 1 MG PO TABS
1.0000 mg | ORAL_TABLET | Freq: Four times a day (QID) | ORAL | Status: DC | PRN
  Administered 2015-10-06 – 2015-10-08 (×6): 1 mg via ORAL
  Filled 2015-10-06 (×2): qty 1
  Filled 2015-10-06: qty 2
  Filled 2015-10-06 (×3): qty 1

## 2015-10-06 MED ORDER — NAPROXEN 500 MG PO TABS
500.0000 mg | ORAL_TABLET | Freq: Two times a day (BID) | ORAL | Status: DC | PRN
  Administered 2015-10-06: 500 mg via ORAL
  Filled 2015-10-06: qty 1

## 2015-10-06 MED ORDER — MAGNESIUM HYDROXIDE 400 MG/5ML PO SUSP: 30.0000 mL | Freq: Every day | ORAL | Status: DC | PRN

## 2015-10-06 MED ORDER — HYDROXYZINE HCL 25 MG PO TABS
25.0000 mg | ORAL_TABLET | Freq: Four times a day (QID) | ORAL | Status: DC | PRN
  Filled 2015-10-06: qty 10

## 2015-10-06 MED ORDER — ACETAMINOPHEN 325 MG PO TABS: 650.0000 mg | ORAL_TABLET | Freq: Four times a day (QID) | ORAL | Status: DC | PRN

## 2015-10-06 MED ORDER — LOPERAMIDE HCL 2 MG PO CAPS
2.0000 mg | ORAL_CAPSULE | ORAL | Status: DC | PRN
  Administered 2015-10-06: 4 mg via ORAL
  Administered 2015-10-08: 2 mg via ORAL
  Filled 2015-10-06: qty 2
  Filled 2015-10-06: qty 1

## 2015-10-06 MED ORDER — BENZTROPINE MESYLATE 0.5 MG PO TABS
0.5000 mg | ORAL_TABLET | Freq: Two times a day (BID) | ORAL | Status: DC | PRN
  Filled 2015-10-06: qty 14

## 2015-10-06 MED ORDER — SERTRALINE HCL 25 MG PO TABS
25.0000 mg | ORAL_TABLET | Freq: Every day | ORAL | Status: DC
  Administered 2015-10-06 – 2015-10-08 (×3): 25 mg via ORAL
  Filled 2015-10-06 (×5): qty 1

## 2015-10-06 MED ORDER — LURASIDONE HCL 40 MG PO TABS
60.0000 mg | ORAL_TABLET | Freq: Every day | ORAL | Status: DC
  Administered 2015-10-07 – 2015-10-08 (×2): 60 mg via ORAL
  Filled 2015-10-06 (×3): qty 1

## 2015-10-06 MED ORDER — ALUM & MAG HYDROXIDE-SIMETH 200-200-20 MG/5ML PO SUSP: 30.0000 mL | ORAL | Status: DC | PRN

## 2015-10-06 MED ORDER — DICYCLOMINE HCL 20 MG PO TABS: 20.0000 mg | ORAL_TABLET | Freq: Four times a day (QID) | ORAL | Status: DC | PRN

## 2015-10-06 MED ORDER — ONDANSETRON 4 MG PO TBDP: 4.0000 mg | ORAL_TABLET | Freq: Four times a day (QID) | ORAL | Status: DC | PRN

## 2015-10-06 MED ORDER — CLONIDINE HCL 0.1 MG PO TABS
0.1000 mg | ORAL_TABLET | ORAL | Status: DC
  Administered 2015-10-08: 0.1 mg via ORAL
  Filled 2015-10-06 (×3): qty 1

## 2015-10-06 MED ORDER — METHOCARBAMOL 500 MG PO TABS
500.0000 mg | ORAL_TABLET | Freq: Three times a day (TID) | ORAL | Status: DC | PRN
  Administered 2015-10-06: 500 mg via ORAL
  Filled 2015-10-06: qty 1

## 2015-10-06 MED ORDER — CLONIDINE HCL 0.1 MG PO TABS: 0.1000 mg | ORAL_TABLET | Freq: Every day | ORAL | Status: DC

## 2015-10-06 MED ORDER — TRAZODONE HCL 100 MG PO TABS
100.0000 mg | ORAL_TABLET | Freq: Every evening | ORAL | Status: DC | PRN
  Administered 2015-10-06 – 2015-10-07 (×2): 100 mg via ORAL
  Filled 2015-10-06 (×2): qty 1

## 2015-10-06 MED ORDER — CLONIDINE HCL 0.1 MG PO TABS
0.1000 mg | ORAL_TABLET | Freq: Four times a day (QID) | ORAL | Status: AC
  Administered 2015-10-06 – 2015-10-07 (×8): 0.1 mg via ORAL
  Filled 2015-10-06 (×9): qty 1

## 2015-10-06 NOTE — Progress Notes (Signed)
35 year old male pt admitted on voluntary basis. Pt reports on-going depression, suicidal thoughts and substance abuse issues. Pt reports that he has been abusing heroin and cocaine with last use being at 5:45 pm, [facility] Pt reports that he has been taking his medications as prescribed and feels it is helping with his auditory and visual hallucinations. Pt reports a stressor as being his girlfriend recently left him and on-going issues with work. Pt reports that he lives with his father and will be able to go back there at discharge. Pt is able to contract for safety on the unit. Pt was oriented to the unit and safety maintained.

## 2015-10-06 NOTE — ED Provider Notes (Signed)
CSN: [id number]     Arrival date & time 10/05/15  2248 History   First MD Initiated Contact with Patient 10/06/15 0008     Chief Complaint  Patient presents with  . Suicidal  . panic attacks     HPI   [name] is a 35 y.o. male with a PMH of polysubstance abuse who presents to the ED with panic attacks and suicidal ideation. He states he has a history of panic attacks "since he can remember," and notes this has worsened recently. He denies exacerbating or alleviating factors. He also notes suicidal ideation, and states he tried to take 49 unisom 4 days ago. He denies homicidal ideation. He reports auditory hallucinations, and states he hears voices talking about "magic and shit." He reports drug use, and notes he uses cocaine and heroin. He states his last use was today prior to arrival. He denies alcohol use. He denies fever, chills, chest pain, shortness of breath, abdominal pain, N/V, numbness, weakness, parethesia.   Past Medical History  Diagnosis Date  . Abscess   . Heroin addiction (HCC)   . GERD (gastroesophageal reflux disease)    History reviewed. No pertinent past surgical history. Family History  Problem Relation Age of Onset  . Mental illness Neg Hx   . Hypertension Neg Hx   . Anxiety disorder Mother    Social History  Substance Use Topics  . Smoking status: Current Every Day Smoker -- 2.00 packs/day for 0 years    Types: Cigarettes  . Smokeless tobacco: Never Used  . Alcohol Use: Yes     Comment: former  has not drank in 2 years       Review of Systems  Constitutional: Negative for fever and chills.  Respiratory: Negative for shortness of breath.   Cardiovascular: Negative for chest pain.  Gastrointestinal: Negative for nausea, vomiting and abdominal pain.  Neurological: Negative for syncope, weakness and numbness.  Psychiatric/Behavioral: Positive for suicidal ideas and hallucinations.  All other systems reviewed and are negative.     Allergies   Review of patient's allergies indicates no known allergies.  Home Medications   Prior to Admission medications   Medication Sig Start Date End Date Taking? Authorizing Provider  benztropine (COGENTIN) 0.5 MG tablet Take 1 tablet (0.5 mg total) by mouth 2 (two) times daily as needed for tremors (eps). 03/21/15  Yes Adonis BrookSheila Agustin, NP  doxylamine, Sleep, (UNISOM) 25 MG tablet Take 100-125 mg by mouth at bedtime as needed for sleep.   Yes Historical Provider, MD  lurasidone (LATUDA) 40 MG TABS tablet Take 60 mg by mouth daily with breakfast.    Yes Historical Provider, MD  sertraline (ZOLOFT) 25 MG tablet Take 1 tablet (25 mg total) by mouth daily. 03/21/15  Yes Adonis BrookSheila Agustin, NP  traZODone (DESYREL) 100 MG tablet Take 1 tablet (100 mg total) by mouth at bedtime as needed for sleep. 03/21/15  Yes Adonis BrookSheila Agustin, NP  haloperidol (HALDOL) 2 MG tablet Take 1 tablet (2 mg total) by mouth 2 (two) times daily. Patient not taking: Reported on 04/23/2015 03/21/15   Adonis BrookSheila Agustin, NP  hydrOXYzine (ATARAX/VISTARIL) 25 MG tablet Take 1 tablet (25 mg total) by mouth 3 (three) times daily as needed for anxiety. Patient not taking: Reported on 10/05/2015 03/21/15   Adonis BrookSheila Agustin, NP  triamcinolone cream (KENALOG) 0.1 % Apply topically 2 (two) times daily. Patient not taking: Reported on 10/05/2015 03/21/15   Adonis BrookSheila Agustin, NP    BP 128/78 mmHg  Pulse  83  Temp(Src) 97.8 F (36.6 C) (Oral)  Resp 18  SpO2 95% Physical Exam  Constitutional: He is oriented to person, place, and time. He appears well-developed and well-nourished. No distress.  HENT:  Head: Normocephalic and atraumatic.  Right Ear: External ear normal.  Left Ear: External ear normal.  Nose: Nose normal.  Mouth/Throat: Uvula is midline, oropharynx is clear and moist and mucous membranes are normal.  Eyes: Conjunctivae, EOM and lids are normal. Pupils are equal, round, and reactive to light. Right eye exhibits no discharge. Left eye  exhibits no discharge. No scleral icterus.  Neck: Normal range of motion. Neck supple.  Cardiovascular: Normal rate, regular rhythm, normal heart sounds, intact distal pulses and normal pulses.   Pulmonary/Chest: Effort normal and breath sounds normal. No respiratory distress. He has no wheezes. He has no rales.  Abdominal: Soft. Normal appearance and bowel sounds are normal. He exhibits no distension and no mass. There is no tenderness. There is no rigidity, no rebound and no guarding.  Musculoskeletal: Normal range of motion. He exhibits no edema or tenderness.  Neurological: He is alert and oriented to person, place, and time. He has normal strength. No cranial nerve deficit or sensory deficit.  Skin: Skin is warm, dry and intact. No rash noted. He is not diaphoretic. No erythema. No pallor.  Psychiatric: He has a normal mood and affect. His speech is normal and behavior is normal. He expresses suicidal ideation. He expresses no homicidal ideation. He expresses no suicidal plans and no homicidal plans.  Nursing note and vitals reviewed.   ED Course  Procedures (including critical care time)  Labs Review Labs Reviewed  COMPREHENSIVE METABOLIC PANEL - Abnormal; Notable for the following:    Chloride 100 (*)    Glucose, Bld 107 (*)    ALT 11 (*)    All other components within normal limits  ACETAMINOPHEN LEVEL - Abnormal; Notable for the following:    Acetaminophen (Tylenol), Serum <10 (*)    All other components within normal limits  CBC - Abnormal; Notable for the following:    WBC 14.6 (*)    All other components within normal limits  URINE RAPID DRUG SCREEN, HOSP PERFORMED - Abnormal; Notable for the following:    Opiates POSITIVE (*)    Cocaine POSITIVE (*)    Benzodiazepines POSITIVE (*)    Tetrahydrocannabinol POSITIVE (*)    All other components within normal limits  ETHANOL  SALICYLATE LEVEL    Imaging Review No results found.   I have personally reviewed and  evaluated these lab results as part of my medical decision-making.   EKG Interpretation None      MDM   Final diagnoses:  Suicidal ideation    35 year old male presents with panic attacks and suicidal ideation. Notes he tried to take 53 unisom several days ago in an attempt to harm himself. Also notes auditory hallucinations.  Patient is afebrile. Vital signs stable. Heart regular rate and rhythm. Lungs clear to auscultation bilaterally. Abdomen soft, nontender, nondistended. Normal neuro exam with no focal deficit. Patient moves all extremities without difficulty.  CBC remarkable for leukocytosis of 14.6, no anemia. CMP unremarkable. Ethanol, acetaminophen, salicylate negative. UDS positive for opiates, cocaine, benzodiazepines, THC.  TTS consulted. Patient medically cleared.  Per [facility] assessment, patient meets inpatient criteria.  BP 128/78 mmHg  Pulse 83  Temp(Src) 97.8 F (36.6 C) (Oral)  Resp 18  SpO2 95%     Mady Gemma, [name] 10/06/15 0232  Riley Lam  [place], MD 10/06/15 [id number]

## 2015-10-06 NOTE — [facility] Suicide Risk Assessment (Signed)
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 Hyperprolactinemia (HCC) [E22.1] 03/19/2015  . 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 Opioid use disorder, moderate, on maintenance therapy [F11.90] 03/17/2015  . Cannabis use disorder, moderate, in sustained remission [F12.90] 03/17/2015  . Psoriasis [L40.9] 03/17/2015  . Aggressive behavior of adult [F60.89]   . Opioid dependence with withdrawal (HCC) [F11.23]   . Psychoses [F29]   . Polysubstance abuse [F19.10] 04/28/2014   Subjective Data: see admission H and P  Continued Clinical Symptoms:  Alcohol Use Disorder Identification Test Final Score (AUDIT): 0 The "Alcohol Use Disorders Identification Test", Guidelines for Use in Primary Care, Second Edition.  World Science writerHealth Organization [facility](WHO). Score between 0-7:  no or low risk or alcohol related problems. Score between 8-15:  moderate risk of alcohol related problems. Score between 16-19:  high risk of alcohol related problems. Score 20 or above:  warrants further diagnostic evaluation for alcohol dependence and treatment.   CLINICAL FACTORS:   Alcohol/Substance Abuse/Dependencies   Psychiatric Specialty Exam: ROS  Blood pressure 128/66, pulse 93, temperature 98.2 F (36.8 C), temperature source Oral, resp. rate 18, height 5\' 8"  (1.727 m), weight 103.874 kg (229 lb).Body mass index is 34.83 kg/(m^2).   COGNITIVE FEATURES THAT CONTRIBUTE TO RISK:  Closed-mindedness, Polarized thinking and Thought constriction (tunnel vision)     SUICIDE RISK:   Moderate:  Frequent suicidal ideation with limited intensity, and duration, some specificity in terms of plans, no associated intent, good self-control, limited dysphoria/symptomatology, some risk factors present, and identifiable protective factors, including available and accessible social support.  PLAN OF CARE: See admission H and P  I certify that inpatient services furnished can reasonably be expected to improve the patient's condition.   Rachael FeeLUGO,Laverne Klugh A, MD 10/06/2015, 5:48 PM

## 2015-10-06 NOTE — Progress Notes (Signed)
Recreation Therapy Notes  Date: 05.03.2017 Time: 9:30am Location: 300 Hall Group Room   Group Topic: Stress Management  Goal Area(s) Addresses:  Patient will actively participate in stress management techniques presented during session.   Behavioral Response: Did not attend.   Jenisis Harmsen L Sadaf Przybysz, LRT/CTRS         Harmoney Sienkiewicz L 10/06/2015 1:32 PM 

## 2015-10-06 NOTE — Tx Team (Signed)
Interdisciplinary Treatment Plan Update (Adult)  Date:  10/06/2015  Time Reviewed:  8:56 AM   Progress in Treatment: Attending groups: No. New to unit. Continuing to assess.  Participating in groups:  No. Taking medication as prescribed:  Yes. Tolerating medication:  Yes. Family/Significant othe contact made:  SPE required for this pt.  Patient understands diagnosis:  Yes. and As evidenced by:  seeking treatment for overdose attempt, SI, depression, heroin abuse, and for medication stabilization. Discussing patient identified problems/goals with staff:  Yes. Medical problems stabilized or resolved:  Yes. Denies suicidal/homicidal ideation: Yes. Issues/concerns per patient self-inventory:  Other:   Discharge Plan or Barriers: CSW assessing for appropriate referrals. Pt currently sees Psychiatrist at [facility] for medication management.   Reason for Continuation of Hospitalization: Depression Medication stabilization Withdrawal symptoms  Comments:  [name] is an 35 y.o. male that is a walk in at [facility]. Patient reports that he took 20 unisom sleeping pills two days ago in an attempt to kill himself. Patient reports increased depression associated with his inability to stop abusing heroin. Patient reports that he has been abusing heroin since the age of 36. Patient reports that he has severe panic attacks if he thinks that he will not be able to have heroin. Patient reports that his longest period of sobriety was for 2 years. Patient reports Withdrawal symptoms. Patient reports that his last use was yesterday. Patient repots that he only uses when he gets paid and he does not remember how much he uses. Patient reports that he has been hearing voices belittling him. Patient reports that he has been prescribed psychiatric medication from [facility] but he does not believe that it is working. Patient reports that he has been compliant with taking the medication. Patient denies  physical, sexual or emotional abuse. Patient Denies HI. Diagnosis: Major Depressive Disorder; Anxiety Disorder Opiate Abuse, Severe  Estimated length of stay:  2-4 days   New goal(s): to develop effective aftercare plan.   Additional Comments:  Patient and CSW reviewed pt's identified goals and treatment plan. Patient verbalized understanding and agreed to treatment plan. CSW reviewed [facility] "Discharge Process and Patient Involvement" Form. Pt verbalized understanding of information provided and signed form.    Review of initial/current patient goals per problem list:  1. Goal(s): Patient will participate in aftercare plan  Met: No.   Target date: at discharge  As evidenced by: Patient will participate within aftercare plan AEB aftercare provider and housing plan at discharge being identified.  5/3: CSW assessing for appropriate referrals. Current with [facility] for medication management.   2. Goal (s): Patient will exhibit decreased depressive symptoms and suicidal ideations.  Met: No.    Target date: at discharge  As evidenced by: Patient will utilize self rating of depression at 3 or below and demonstrate decreased signs of depression or be deemed stable for discharge by MD.  5/3: Pt rates depression as high. Denies SI/Hi/AVH today.   3. Goal(s): Patient will demonstrate decreased signs of withdrawal due to substance abuse  Met:No.   Target date:at discharge   As evidenced by: Patient will produce a CIWA/COWS score of 0, have stable vitals signs, and no symptoms of withdrawal.  5/3: Pt reports moderate withdrawal symptoms with COWS of 5 and high standing pulse.    Attendees: Patient:   10/06/2015 8:56 AM   Family:   10/06/2015 8:56 AM   Physician:  Dr. Carlton Adam, MD 10/06/2015 8:56 AM   Nursing:   Chestine Spore RN  10/06/2015 8:56 AM   Clinical Social Worker: [place], LCSW 10/06/2015 8:56 AM   Clinical Social Worker: Erasmo Downer Drinkard LCSW 10/06/2015 8:56 AM    Other:  Gerline Legacy Nurse Case Manager 10/06/2015 8:56 AM   Other:  Agustina Caroli NP 10/06/2015 8:56 AM   Other:   10/06/2015 8:56 AM   Other:  10/06/2015 8:56 AM   Other:  10/06/2015 8:56 AM   Other:  10/06/2015 8:56 AM    10/06/2015 8:56 AM    10/06/2015 8:56 AM    10/06/2015 8:56 AM    10/06/2015 8:56 AM    Scribe for Treatment Team:   Maxie Better, LCSW 10/06/2015 8:56 AM

## 2015-10-06 NOTE — Tx Team (Signed)
Initial Interdisciplinary Treatment Plan   PATIENT STRESSORS: Loss of girlfriend Occupational concerns Substance abuse   PATIENT STRENGTHS: Ability for insight Average or above average intelligence Capable of independent living General fund of knowledge Motivation for treatment/growth   PROBLEM LIST: Problem List/Patient Goals Date to be addressed Date deferred Reason deferred Estimated date of resolution  Depression 10/06/15     Suicidal thoughts 10/06/15     Substance Abuse 10/06/15     "I want to feel normal and I don't want to have panic attacks" 10/06/15                                    DISCHARGE CRITERIA:  Ability to meet basic life and health needs Improved stabilization in mood, thinking, and/or behavior Verbal commitment to aftercare and medication compliance Withdrawal symptoms are absent or subacute and managed without 24-hour nursing intervention  PRELIMINARY DISCHARGE PLAN: Attend aftercare/continuing care group Return to previous living arrangement  PATIENT/FAMIILY INVOLVEMENT: This treatment plan has been presented to and reviewed with the patient, [name], and/or family member, .  The patient and family have been given the opportunity to ask questions and make suggestions.  Kerri Kovacik, [place] 10/06/2015, 4:03 AM

## 2015-10-06 NOTE — Progress Notes (Signed)
Patient attended N/A group tonight.  
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
## 2015-10-06 NOTE — [facility] Group Notes (Signed)
[facility]   10/06/2015 11:58 AM  Participation Quality:  Invited. DID NOT ATTEND. Pt chose to remain in bed.   Smart, Aveen Stansel LCSW

## 2015-10-06 NOTE — Progress Notes (Signed)
Patient ID: [name], male   DOB: [birth year], 35 y.o.   MRN: [id number]  DAR: Pt. Denies SI/HI and A/V Hallucinations. He reports sleep is good, appetite is good, energy level is normal, and concentration is good. He rates depression, anxiety, and hopelessness 0/10. Patient does not report any pain or discomfort at this time. Support and encouragement provided to the patient. Scheduled medications administered to patient per physician's orders. Patient is receptive and cooperative. He is seen in the milieu and is attending some groups. Q15 minute checks are maintained for safety.

## 2015-10-06 NOTE — H&P (Signed)
Psychiatric Admission Assessment Adult  Patient Identification: [name] MRN:  [id number] Date of Evaluation:  10/06/2015 Chief Complaint:  MDD Principal Diagnosis: <principal problem not specified> Diagnosis:   Patient Active Problem List   Diagnosis Date Noted  . Hyperprolactinemia ([place]) [E22.1] 03/19/2015  . 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 Opioid use disorder, moderate, on maintenance therapy [F11.90] 03/17/2015  . Cannabis use disorder, moderate, in sustained remission [F12.90] 03/17/2015  . Psoriasis [L40.9] 03/17/2015  . Aggressive behavior of adult [F60.89]   . Opioid dependence with withdrawal ([place]) [F11.23]   . Psychoses [F29]   . Polysubstance abuse [F19.10] 04/28/2014   History of Present Illness::34 Y/O male who states he was having real bad panic attacks while at work. States he was going trough  withdrawal from opiate use (heroin) and "all" pills. Was on methadone for 2 years. Went off. Started using 5-6 months ago. 60 mg of methadone. States his father cut him off as did not want to pay for it anymore. Admits to Persistent depression. Has also experienced some hallucinations. Hears people talking among each other The initial assessment is as follows [name] is an 35 y.o. male that is a walk in at [facility]. Patient reports that he took 69 unisom sleeping pills two days ago in an attempt to kill himself. Patient reports increased depression associated with his inability to stop abusing heroine.  Patient repots that he has been abusing heroine since the age of 76. Patient reports that he has severe panic attacks if he thinks that he will not be able to have heroine. Patient reports that his longest period of sobriety was for 2 years. Patient reports  Withdrawal symptoms. Patient reports that his last use was yesterday. Patient repots that he only uses when he gets paid and he does  not remember how much he uses. Patient reports that he has been hearing voices belittling him.  Patient reports that he has been prescribed psychiatric medication from [facility] but he does not believe that it is working. Patient reports that he has been compliant with taking the medication. Patient denies physical, sexual or emotional abuse. Patient  Denies HI.   Associated Signs/Symptoms: Depression Symptoms:  depressed mood, anhedonia, fatigue, feelings of worthlessness/guilt, difficulty concentrating, suicidal thoughts without plan, anxiety, panic attacks, loss of energy/fatigue, disturbed sleep, (Hypo) Manic Symptoms:  denies Anxiety Symptoms:  Excessive Worry, Panic Symptoms, Psychotic Symptoms:  Hallucinations: Auditory Paranoia, when using PTSD Symptoms: Negative Total Time spent with patient: 45 minutes  Past Psychiatric History:   Is the patient at risk to self? Yes.    Has the patient been a risk to self in the past 6 months? Yes.    Has the patient been a risk to self within the distant past? Yes.    Is the patient a risk to others? No.  Has the patient been a risk to others in the past 6 months? No.  Has the patient been a risk to others within the distant past? No.   Prior Inpatient Therapy: Prior Inpatient Therapy: No Prior Therapy Dates: NA Prior Therapy Facilty/Provider(s): NA Reason for Treatment: NA  In  Patient last year at [facility] was practicing black magic, before that on meth psychosis  Prior Outpatient Therapy: Prior Outpatient Therapy: Yes Prior Therapy Dates: [place]  Prior Therapy Facilty/Provider(s): [facility] Reason for Treatment: Medication Management and OPT Does patient have an ACCT team?: No Does patient have Intensive In-House Services?  :  No Does patient have [facility] services? : Yes Does patient have [facility] services?: No  Alcohol Screening: 1. How often do you have a drink containing alcohol?: Never 9. Have you or someone else been  injured as a result of your drinking?: No 10. Has a relative or friend or a doctor or another health worker been concerned about your drinking or suggested you cut down?: No Alcohol Use Disorder Identification Test Final Score (AUDIT): 0 Brief Intervention: AUDIT score less than 7 or less-screening does not suggest unhealthy drinking-brief intervention not indicated Substance Abuse History in the last 12 months:  Yes.   Consequences of Substance Abuse: Legal Consequences:  2 DWI Withdrawal Symptoms:   Diaphoresis Diarrhea Tremors Previous Psychotropic Medications: Yes  Psychological Evaluations: No  Past Medical History:  Past Medical History  Diagnosis Date  . Abscess   . Heroin addiction ([place])   . GERD (gastroesophageal reflux disease)    History reviewed. No pertinent past surgical history. Family History:  Family History  Problem Relation Age of Onset  . Mental illness Neg Hx   . Hypertension Neg Hx   . Anxiety disorder Mother    Family Psychiatric  History: Mother has panic on Xanax sister heavy on drugs in the past not now Tobacco Screening: _0 ([phone])::1)@ Social History:  History  Alcohol Use No    Comment: former  has not drank in 2 years      History  Drug Use  . Yes  . 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   Additional Social History: Marital status: Single    History of alcohol / drug use?: Yes Longest period of sobriety (when/how long): 2 years clean Negative Consequences of Use: Financial, Personal relationships, Work / School Withdrawal Symptoms: Tingling, Fever / Chills, Weakness Name of Substance 1: Heroine 1 - Age of First Use: 30 1 - Amount (size/oz): varies 1 - Frequency: weekly on his pay days 1 - Duration: 3 years 1 - Last Use / Amount:  Yesterday when he used $40.00 worth of heroine                  Allergies:  No Known Allergies Lab Results:  Results for orders placed or performed during the hospital encounter of 10/05/15 (from the past 48 hour(s))  Rapid urine drug screen (hospital performed)     Status: Abnormal   Collection Time: 10/05/15 11:49 AM  Result Value Ref Range   Opiates POSITIVE (A) NONE DETECTED   Cocaine POSITIVE (A) NONE DETECTED   Benzodiazepines POSITIVE (A) NONE DETECTED   Amphetamines NONE DETECTED NONE DETECTED   Tetrahydrocannabinol POSITIVE (A) NONE DETECTED   Barbiturates NONE DETECTED NONE DETECTED    Comment:        DRUG SCREEN FOR MEDICAL PURPOSES ONLY.  IF CONFIRMATION IS NEEDED FOR ANY PURPOSE, NOTIFY LAB WITHIN 5 DAYS.        LOWEST DETECTABLE LIMITS FOR URINE DRUG SCREEN Drug Class       Cutoff (ng/mL) Amphetamine      1000 Barbiturate      200 Benzodiazepine   017 Tricyclics       793 Opiates          300 Cocaine          300 THC  50   Comprehensive metabolic panel     Status: Abnormal   Collection Time: 10/05/15 11:45 PM  Result Value Ref Range   Sodium 138 135 - 145 mmol/L   Potassium 3.7 3.5 - 5.1 mmol/L   Chloride 100 (L) 101 - 111 mmol/L   CO2 27 22 - 32 mmol/L   Glucose, Bld 107 (H) 65 - 99 mg/dL   BUN 11 6 - 20 mg/dL   Creatinine, Ser 0.90 0.61 - 1.24 mg/dL   Calcium 9.2 8.9 - 10.3 mg/dL   Total Protein 7.9 6.5 - 8.1 g/dL   Albumin 3.9 3.5 - 5.0 g/dL   AST 15 15 - 41 U/L   ALT 11 (L) 17 - 63 U/L   Alkaline Phosphatase 69 38 - 126 U/L   Total Bilirubin 0.7 0.3 - 1.2 mg/dL   GFR calc non Af Amer >60 >60 mL/min   GFR calc Af Amer >60 >60 mL/min    Comment: (NOTE) The eGFR has been calculated using the CKD EPI equation. This calculation has not been validated in all clinical situations. eGFR's persistently <60 mL/min signify possible Chronic Kidney Disease.    Anion gap 11 5 - 15  Ethanol     Status: None   Collection Time: 10/05/15  11:45 PM  Result Value Ref Range   Alcohol, Ethyl (B) <5 <5 mg/dL    Comment:        LOWEST DETECTABLE LIMIT FOR SERUM ALCOHOL IS 5 mg/dL FOR MEDICAL PURPOSES ONLY   Salicylate level     Status: None   Collection Time: 10/05/15 11:45 PM  Result Value Ref Range   Salicylate Lvl <4.6 2.8 - 30.0 mg/dL  Acetaminophen level     Status: Abnormal   Collection Time: 10/05/15 11:45 PM  Result Value Ref Range   Acetaminophen (Tylenol), Serum <10 (L) 10 - 30 ug/mL    Comment:        THERAPEUTIC CONCENTRATIONS VARY SIGNIFICANTLY. A RANGE OF 10-30 ug/mL MAY BE AN EFFECTIVE CONCENTRATION FOR MANY PATIENTS. HOWEVER, SOME ARE BEST TREATED AT CONCENTRATIONS OUTSIDE THIS RANGE. ACETAMINOPHEN CONCENTRATIONS >150 ug/mL AT 4 HOURS AFTER INGESTION AND >50 ug/mL AT 12 HOURS AFTER INGESTION ARE OFTEN ASSOCIATED WITH TOXIC REACTIONS.   cbc     Status: Abnormal   Collection Time: 10/05/15 11:45 PM  Result Value Ref Range   WBC 14.6 (H) 4.0 - 10.5 K/uL   RBC 5.13 4.22 - 5.81 MIL/uL   Hemoglobin 15.6 13.0 - 17.0 g/dL   HCT 45.3 39.0 - 52.0 %   MCV 88.3 78.0 - 100.0 fL   MCH 30.4 26.0 - 34.0 pg   MCHC 34.4 30.0 - 36.0 g/dL   RDW 13.8 11.5 - 15.5 %   Platelets 254 150 - 400 K/uL    Blood Alcohol level:  Lab Results  Component Value Date   ETH <5 10/05/2015   ETH <5 96/29/5284    Metabolic Disorder Labs:  Lab Results  Component Value Date   HGBA1C 5.8* 03/18/2015   MPG 120 03/18/2015   Lab Results  Component Value Date   PROLACTIN 31.5* 03/18/2015   Lab Results  Component Value Date   CHOL 198 03/18/2015   TRIG 221* 03/18/2015   HDL 32* 03/18/2015   CHOLHDL 6.2 03/18/2015   VLDL 44* 03/18/2015   LDLCALC 122* 03/18/2015    Current Medications: Current Facility-Administered Medications  Medication Dose Route Frequency Provider Last Rate Last Dose  . acetaminophen (TYLENOL) tablet 650 mg  650 mg Oral Q6H PRN Laverle Hobby, [name]      . alum & mag hydroxide-simeth  (MAALOX/MYLANTA) 200-200-20 MG/5ML suspension 30 mL  30 mL Oral Q4H PRN Laverle Hobby, [name]      . cloNIDine (CATAPRES) tablet 0.1 mg  0.1 mg Oral QID Laverle Hobby, [name]   0.1 mg at 10/06/15 1209   Followed by  . [START ON 10/08/2015] cloNIDine (CATAPRES) tablet 0.1 mg  0.1 mg Oral BH-qamhs Spencer E Simon, [name]       Followed by  . [START ON 10/10/2015] cloNIDine (CATAPRES) tablet 0.1 mg  0.1 mg Oral QAC breakfast Laverle Hobby, [name]      . dicyclomine (BENTYL) tablet 20 mg  20 mg Oral Q6H PRN Laverle Hobby, [name]      . hydrOXYzine (ATARAX/VISTARIL) tablet 25 mg  25 mg Oral Q6H PRN Laverle Hobby, [name]      . loperamide (IMODIUM) capsule 2-4 mg  2-4 mg Oral PRN Laverle Hobby, [name]      . LORazepam (ATIVAN) tablet 1 mg  1 mg Oral Q6H PRN Laverle Hobby, [name]   1 mg at 10/06/15 0400  . magnesium hydroxide (MILK OF MAGNESIA) suspension 30 mL  30 mL Oral Daily PRN Laverle Hobby, [name]      . methocarbamol (ROBAXIN) tablet 500 mg  500 mg Oral Q8H PRN Laverle Hobby, [name]   500 mg at 10/06/15 0400  . naproxen (NAPROSYN) tablet 500 mg  500 mg Oral BID PRN Laverle Hobby, [name]   500 mg at 10/06/15 0400  . nicotine (NICODERM CQ - dosed in mg/24 hours) patch 21 mg  21 mg Transdermal Q0600 Laverle Hobby, [name]   21 mg at 10/06/15 0844  . ondansetron (ZOFRAN-ODT) disintegrating tablet 4 mg  4 mg Oral Q6H PRN Laverle Hobby, [name]       PTA Medications: Prescriptions prior to admission  Medication Sig Dispense Refill Last Dose  . benztropine (COGENTIN) 0.5 MG tablet Take 1 tablet (0.5 mg total) by mouth 2 (two) times daily as needed for tremors (eps). 30 tablet 0 10/05/2015 at Unknown time  . doxylamine, Sleep, (UNISOM) 25 MG tablet Take 100-125 mg by mouth at bedtime as needed for sleep.   Past Week at Unknown time  . lurasidone (LATUDA) 40 MG TABS tablet Take 60 mg by mouth daily with breakfast.    10/05/2015 at Unknown time  . sertraline (ZOLOFT) 25 MG tablet Take 1 tablet (25 mg total) by  mouth daily. 30 tablet 0 10/05/2015 at Unknown time  . haloperidol (HALDOL) 2 MG tablet Take 1 tablet (2 mg total) by mouth 2 (two) times daily. (Patient not taking: Reported on 04/23/2015) 60 tablet 0 Completed Course at Unknown time  . hydrOXYzine (ATARAX/VISTARIL) 25 MG tablet Take 1 tablet (25 mg total) by mouth 3 (three) times daily as needed for anxiety. (Patient not taking: Reported on 10/05/2015) 30 tablet 0 Completed Course at Unknown time  . traZODone (DESYREL) 100 MG tablet Take 1 tablet (100 mg total) by mouth at bedtime as needed for sleep. 30 tablet 0 10/04/2015  . triamcinolone cream (KENALOG) 0.1 % Apply topically 2 (two) times daily. (Patient not taking: Reported on 10/05/2015) 30 g 0 Not Taking at Unknown time    Musculoskeletal: Strength & Muscle Tone: within normal limits Gait & Station: normal Patient leans: normal  Psychiatric Specialty Exam: Physical Exam  Review of Systems  Constitutional: Positive for  weight loss and malaise/fatigue.  Eyes: Positive for blurred vision.  Respiratory:       2 packs  Cardiovascular: Negative.   Gastrointestinal: Positive for heartburn and diarrhea.  Genitourinary: Negative.   Musculoskeletal: Negative.   Skin: Positive for rash.  Neurological: Positive for weakness and headaches.  Endo/Heme/Allergies: Negative.   Psychiatric/Behavioral: Positive for depression, hallucinations and substance abuse. The patient is nervous/anxious and has insomnia.     Blood pressure 142/73, pulse 90, temperature 98.3 F (36.8 C), temperature source Oral, resp. rate 18, height _0  (1.727 m), weight 103.874 kg (229 lb).Body mass index is 34.83 kg/(m^2).  General Appearance: Fairly Groomed  Engineer, water::  Fair  Speech:  Clear and Coherent  Volume:  fluctuates  Mood:  Anxious and Dysphoric  Affect:  Restricted  Thought Process:  Coherent and Goal Directed  Orientation:  Full (Time, Place, and Person)  Thought Content:  symptoms events worries concerns   Suicidal Thoughts:  Yes.  without intent/plan  Homicidal Thoughts:  No  Memory:  Immediate;   Fair Recent;   Fair Remote;   Fair  Judgement:  Fair  Insight:  Shallow  Psychomotor Activity:  Restlessness  Concentration:  Fair  Recall:  [employer] of Knowledge:Fair  Language: Fair  Akathisia:  No  Handed:  Right  AIMS (if indicated):     Assets:  Desire for Improvement  ADL's:  Intact  Cognition: WNL  Sleep:  Number of Hours: 1.75     Treatment Plan Summary: Daily contact with patient to assess and evaluate symptoms and progress in treatment and Medication management Supportive approach/coping skills Opioid dependence; clonidine detox protocol/work a relapse prevention plan Mood instability; reassess for a mood stabilizer Anxiety/panic; reassess for none benzodiazepines anxiolytics Hallucinations: reassess for the use of an atypical  Work with CBT/mindfulness   Observation Level/Precautions:  15 minute checks  Laboratory:  As per the ED  Psychotherapy:  Individual/group  Medications:  Clonidine detox protocol/reassess for other psychotropics  Consultations:    Discharge Concerns:    Estimated LOS: 3-5 days  Other:     I certify that inpatient services furnished can reasonably be expected to improve the patient's condition.    Nicholaus Bloom, MD 5/3/20171:23 PM

## 2015-10-06 NOTE — [facility] Suicide Risk Assessment (Signed)
[facility] INPATIENT:  Family/Significant Other Suicide Prevention Education  Suicide Prevention Education:  Patient Refusal for Family/Significant Other Suicide Prevention Education: The patient [name] has refused to provide written consent for family/significant other to be provided Family/Significant Other Suicide Prevention Education during admission and/or prior to discharge.  Physician notified.  SPE completed with pt, as pt refused to consent to family contact. SPI pamphlet provided to pt and pt was encouraged to share information with support network, ask questions, and talk about any concerns relating to SPE. Pt denies access to guns/firearms and verbalized understanding of information provided. Mobile Crisis information also provided to pt.   Smart, Leonel Mccollum LCSW 10/06/2015, 3:20 PM

## 2015-10-07 LAB — LIPID PANEL
CHOL/HDL RATIO: 7.4 ratio
CHOLESTEROL: 192 mg/dL (ref 0–200)
HDL: 26 mg/dL — AB (ref >40)
LDL Cholesterol: 111 mg/dL — ABNORMAL HIGH (ref 0–99)
Triglycerides: 276 mg/dL — ABNORMAL HIGH (ref <150)
VLDL: 55 mg/dL — ABNORMAL HIGH (ref 0–40)

## 2015-10-07 MED ORDER — NICOTINE POLACRILEX 2 MG MT GUM
2.0000 mg | CHEWING_GUM | OROMUCOSAL | Status: DC | PRN
  Administered 2015-10-07 – 2015-10-08 (×2): 2 mg via ORAL
  Filled 2015-10-07 (×2): qty 1

## 2015-10-07 MED ORDER — TRIAMCINOLONE ACETONIDE 0.1 % EX CREA
TOPICAL_CREAM | Freq: Two times a day (BID) | CUTANEOUS | Status: DC
  Administered 2015-10-07: 17:00:00 via TOPICAL
  Filled 2015-10-07: qty 15

## 2015-10-07 NOTE — Progress Notes (Signed)
Adult Psychoeducational Group Note  Date:  10/07/2015 Time:  09:00am  Group Topic/Focus:  Overcoming Stress:   The focus of this group is to define stress and help patients assess their triggers.  Participation Level:  Active  Participation Quality:  Appropriate and Redirectable  Affect:  Not Congruent  Cognitive:  Disorganized  Insight: Improving  Engagement in Group:  Engaged and Off Topic  Modes of Intervention:  Discussion, Education and Support  Additional Comments:  Pt able to identify cuddling with his dog and listening to music as healthy ways to cope with stress.  Aurora Maskwyman, Palak Tercero E 10/07/2015, 9:59 AM

## 2015-10-07 NOTE — [facility] Group Notes (Signed)
[facility] LCSW Group Therapy  10/07/2015 4:32 PM  Type of Therapy:  Group Therapy  Participation Level:  Did Not Attend-pt invited. Chose to rest in bed.   Modes of Intervention:  Confrontation, Discussion, Education, Exploration, Problem-solving, Rapport Building, Socialization and Support  Summary of Progress/Problems: Today's Topic: Overcoming Obstacles. Patients identified one short term goal and potential obstacles in reaching this goal. Patients processed barriers involved in overcoming these obstacles. Patients identified steps necessary for overcoming these obstacles and explored motivation (internal and external) for facing these difficulties head on.   Smart, William Tapp LCSW 10/07/2015, 4:32 PM

## 2015-10-07 NOTE — Progress Notes (Signed)
[facility] MD Progress Note  10/07/2015 6:05 PM [name]  MRN:  [id number] Subjective:  States he feels he is not withdrawing anymore. 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 Hyperprolactinemia (HCC) [E22.1] 03/19/2015  . 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 Opioid use disorder, moderate, on maintenance therapy [F11.90] 03/17/2015  . Cannabis use disorder, moderate, in sustained remission [F12.90] 03/17/2015  . Psoriasis [L40.9] 03/17/2015  . Aggressive behavior of adult [F60.89]   . Opioid dependence with withdrawal (HCC) [F11.23]   . Psychoses [F29]   . Polysubstance abuse [F19.10] 04/28/2014   Total Time spent with patient: 20 minutes  Past Psychiatric History: see admission H and P  Past Medical History:  Past Medical History  Diagnosis Date  . Abscess   . Heroin addiction (HCC)   . GERD (gastroesophageal reflux disease)    History reviewed. No pertinent past surgical history. Family History:  Family History  Problem Relation Age of Onset  . Mental illness Neg Hx   . Hypertension Neg Hx   . Anxiety disorder Mother    Family Psychiatric  History: see admission H and P Social History:  History  Alcohol Use No    Comment: former  has not drank in 2 years      History  Drug Use  . Yes  . Special: Other-see comments, Cocaine, Marijuana, Heroin    Comment: abuses Unisom, hx of cannabis abuse, bzd abuse , stimulant abuse    Social History   Social History  . Marital Status: Single    Spouse Name: N/A  . Number of Children: N/A  . Years of Education: N/A   Social History Main Topics  . Smoking status: Current Every Day Smoker -- 2.00 packs/day for 0 years    Types: Cigarettes   . Smokeless tobacco: Never Used  . Alcohol Use: No     Comment: former  has not drank in 2 years   . Drug Use: Yes    Special: Other-see comments, Cocaine, Marijuana, Heroin     Comment: abuses Unisom, hx of cannabis abuse, bzd abuse , stimulant abuse  . Sexual Activity: No   Other Topics Concern  . None   Social History Narrative   Additional Social History:    History of alcohol / drug use?: Yes Longest period of sobriety (when/how long): 2 years clean Negative Consequences of Use: Surveyor, quantity, Personal relationships, Work / Programmer, multimedia Withdrawal Symptoms: Tingling, Fever / Chills, Weakness Name of Substance 1: Heroine 1 - Age of First Use: 30 1 - Amount (size/oz): varies 1 - Frequency: weekly on his pay days 1 - Duration: 3 years 1 - Last Use / Amount: Yesterday when he used $40.00 worth of heroine                  Sleep: Fair  Appetite:  Fair  Current Medications: Current Facility-Administered Medications  Medication Dose Route Frequency Provider Last Rate Last Dose  . acetaminophen (TYLENOL) tablet 650 mg  650 mg Oral Q6H PRN Kerry Hough, [name]      . alum & mag hydroxide-simeth (MAALOX/MYLANTA) 200-200-20 MG/5ML suspension 30 mL  30 mL Oral Q4H PRN Kerry Hough, [name]      . benztropine (COGENTIN) tablet 0.5  mg  0.5 mg Oral BID PRN Rachael FeeIrving A Eljay Lave, MD      . cloNIDine (CATAPRES) tablet 0.1 mg  0.1 mg Oral QID Kerry HoughSpencer E Simon, [name]   0.1 mg at 10/07/15 1712   Followed by  . [START ON 10/08/2015] cloNIDine (CATAPRES) tablet 0.1 mg  0.1 mg Oral BH-qamhs Spencer E Simon, [name]       Followed by  . [START ON 10/10/2015] cloNIDine (CATAPRES) tablet 0.1 mg  0.1 mg Oral QAC breakfast Kerry HoughSpencer E Simon, [name]      . dicyclomine (BENTYL) tablet 20 mg  20 mg Oral Q6H PRN Kerry HoughSpencer E Simon, [name]      . hydrOXYzine (ATARAX/VISTARIL) tablet 25 mg  25 mg Oral Q6H PRN Kerry HoughSpencer E Simon, [name]      . loperamide (IMODIUM) capsule 2-4 mg  2-4 mg Oral PRN Kerry HoughSpencer E Simon, [name]   4 mg at  10/06/15 2118  . LORazepam (ATIVAN) tablet 1 mg  1 mg Oral Q6H PRN Kerry HoughSpencer E Simon, [name]   1 mg at 10/07/15 1457  . lurasidone (LATUDA) tablet 60 mg  60 mg Oral Q breakfast Rachael FeeIrving A Shequita Peplinski, MD   60 mg at 10/07/15 0820  . magnesium hydroxide (MILK OF MAGNESIA) suspension 30 mL  30 mL Oral Daily PRN Kerry HoughSpencer E Simon, [name]      . methocarbamol (ROBAXIN) tablet 500 mg  500 mg Oral Q8H PRN Kerry HoughSpencer E Simon, [name]   500 mg at 10/06/15 0400  . naproxen (NAPROSYN) tablet 500 mg  500 mg Oral BID PRN Kerry HoughSpencer E Simon, [name]   500 mg at 10/06/15 0400  . nicotine (NICODERM CQ - dosed in mg/24 hours) patch 21 mg  21 mg Transdermal Q0600 Kerry HoughSpencer E Simon, [name]   21 mg at 10/07/15 0820  . ondansetron (ZOFRAN-ODT) disintegrating tablet 4 mg  4 mg Oral Q6H PRN Kerry HoughSpencer E Simon, [name]      . sertraline (ZOLOFT) tablet 25 mg  25 mg Oral Daily Rachael FeeIrving A Cash Duce, MD   25 mg at 10/07/15 [id number]  . traZODone (DESYREL) tablet 100 mg  100 mg Oral QHS PRN Rachael FeeIrving A Jj Enyeart, MD   100 mg at 10/06/15 2116  . triamcinolone cream (KENALOG) 0.1 %   Topical BID Sanjuana KavaAgnes I Nwoko, NP        Lab Results:  Results for orders placed or performed during the hospital encounter of 10/06/15 (from the past 48 hour(s))  Lipid panel     Status: Abnormal   Collection Time: 10/07/15  6:21 AM  Result Value Ref Range   Cholesterol 192 0 - 200 mg/dL   Triglycerides 829276 (H) <150 mg/dL   HDL 26 (L) >56>40 mg/dL   Total CHOL/HDL Ratio 7.4 RATIO   VLDL 55 (H) 0 - 40 mg/dL   LDL Cholesterol 213111 (H) 0 - 99 mg/dL    Comment:        Total Cholesterol/HDL:CHD Risk Coronary Heart Disease Risk Table                     Men   Women  1/2 Average Risk   3.4   3.3  Average Risk       5.0   4.4  2 X Average Risk   9.6   7.1  3 X Average Risk  23.4   11.0        Use the calculated Patient Ratio above and the CHD Risk Table to determine the patient's CHD Risk.  ATP III CLASSIFICATION (LDL):  <100     mg/dL   Optimal  161-096  mg/dL   Near or Above                     Optimal  130-159  mg/dL   Borderline  045-409  mg/dL   High  >811     mg/dL   Very High Performed at [facility]     Blood Alcohol level:  Lab Results  Component Value Date   [facility] <5 10/05/2015   ETH <5 04/23/2015    Physical Findings: AIMS: Facial and Oral Movements Muscles of Facial Expression: None, normal Lips and Perioral Area: None, normal Jaw: None, normal Tongue: None, normal,Extremity Movements Upper (arms, wrists, hands, fingers): None, normal Lower (legs, knees, ankles, toes): None, normal, Trunk Movements Neck, shoulders, hips: None, normal, Overall Severity Severity of abnormal movements (highest score from questions above): None, normal Incapacitation due to abnormal movements: None, normal Patient's awareness of abnormal movements (rate only patient's report): No Awareness, Dental Status Current problems with teeth and/or dentures?: No Does patient usually wear dentures?: No  CIWA:    COWS:  COWS Total Score: 3  Musculoskeletal: Strength & Muscle Tone: within normal limits Gait & Station: normal Patient leans: normal  Psychiatric Specialty Exam: Review of Systems  Constitutional: Positive for malaise/fatigue.  HENT: Negative.   Eyes: Negative.   Cardiovascular: Negative.   Gastrointestinal: Negative.   Genitourinary: Negative.   Musculoskeletal: Negative.   Skin: Negative.   Endo/Heme/Allergies: Negative.   Psychiatric/Behavioral: Positive for substance abuse. The patient is nervous/anxious.     Blood pressure 111/81, pulse 92, temperature 98.3 F (36.8 C), temperature source Oral, resp. rate 16, height  (1.727 m), weight 103.874 kg (229 lb).Body mass index is 34.83 kg/(m^2).  General Appearance: Disheveled  Eye Solicitor::  Fair  Speech:  Clear and Coherent  Volume:  Decreased  Mood:  euthymic  Affect:  Appropriate  Thought Process:  Coherent and Goal Directed  Orientation:  Full (Time, Place, and Person)  Thought Content:   symptoms events worries concerns  Suicidal Thoughts:  No  Homicidal Thoughts:  No  Memory:  Immediate;   Fair Recent;   Fair Remote;   Fair  Judgement:  Fair  Insight:  Present and Shallow  Psychomotor Activity:  Restlessness  Concentration:  Fair  Recall:  [employer] of Knowledge:Fair  Language: Fair  Akathisia:  Negative  Handed:  Right  AIMS (if indicated):     Assets:  Desire for Improvement Housing Social Support  ADL's:  Intact  Cognition: WNL  Sleep:  Number of Hours: 5   Treatment Plan Summary: Daily contact with patient to assess and evaluate symptoms and progress in treatment and Medication management Supportive approach/coping skills Opioid dependence; clonidine detox protocol/work a relapse prevention plan Mood instability; continue the Latuda 60 Depression; continue the Zoloft  Work with CBT/mindfulness Lakendrick Paradis A, MD 10/07/2015, 6:05 PM

## 2015-10-07 NOTE — Progress Notes (Signed)
[facility] Group Notes:  (Nursing/MHT/Case Management/Adjunct)  Date:  10/07/2015  Time:  2100  Type of Therapy:  wrap up group  Participation Level:  Active  Participation Quality:  Appropriate, Attentive, Sharing and Supportive  Affect:  Flat  Cognitive:  Appropriate  Insight:  Lacking  Engagement in Group:  Engaged  Modes of Intervention:  Clarification, Education and Support  Summary of Progress/Problems: Pt minimizes and shares that he does not use as much as he used to but reports anxiety and panic symptoms associated with his time at work while not using. [facility] and hope to start a non narcotic for his anxiety. Pt shares 2 years clean from alcohol.   Johann CapersMcNeil, Sherol Sabas S 10/07/2015, 10:07 PM

## 2015-10-07 NOTE — Progress Notes (Signed)
D: Pt presents anxious in affect and mood. Pt reports withdrawal symptoms of cold sweats, anxiety, diarrhea, and restless legs. Pt is currently denying any SI/HI. Pt denies any current AVH. However, pt reports that he often sees demons and other spirits. Pt attended NA. Pt is compliant with his current POC.  A: Writer administered scheduled and prn medications to pt, per MD orders. Continued support and availability as needed was extended to this pt. Staff continues to monitor pt with q5115min checks.  R: No adverse drug reactions noted. Pt receptive to treatment. Pt remains safe at this time.

## 2015-10-07 NOTE — Progress Notes (Signed)
Patient ID: [name], male   DOB: 1981-02-28, 35 y.o.   MRN: [id number]   Pt currently presents with a incongruent affect and restless behavior. Per self inventory, pt rates depression at a 0, hopelessness 0 and anxiety 5. Pt's daily goal is "don't think about using" and they intend to do so by "stay focused and busy." Pt reports "I have so much access to drugs through all of these acquaintances, I owe two people money so I can't just not talk to them." Pt reports good sleep, a good appetite, normal energy and good concentration. Pt reports increased anxiety and restless legs as a part of his withdrawal.   Pt provided with medications per providers orders. Pt's labs and vitals were monitored throughout the day. Pt supported emotionally and encouraged to express concerns and questions. Pt educated on medications.  Pt's safety ensured with 15 minute and environmental checks. Pt thought process disorganized.  Pt currently denies SI/HI. Pt endorses AVH including "a cloud that goes by some times" and "hearing someone say he's a badass rapper"; pt reports "I can tell the difference between what is real and what's not now, the Latuda really helps with that." Pt verbally agrees to seek staff if SI/HI or A/VH occurs and to consult with staff before acting on these thoughts. Pt glamorizes drug use and IV needle use throughout the day in random statements made. Will continue POC.

## 2015-10-08 LAB — HEMOGLOBIN A1C
HEMOGLOBIN A1C: 6.2 % — AB (ref 4.8–5.6)
MEAN PLASMA GLUCOSE: 131 mg/dL

## 2015-10-08 MED ORDER — LURASIDONE HCL 60 MG PO TABS: 60.0000 mg | ORAL_TABLET | Freq: Every day | ORAL | Status: DC

## 2015-10-08 MED ORDER — LURASIDONE HCL 40 MG PO TABS
60.0000 mg | ORAL_TABLET | Freq: Every day | ORAL | Status: DC
  Filled 2015-10-08: qty 4

## 2015-10-08 MED ORDER — HYDROXYZINE HCL 25 MG PO TABS: 25.0000 mg | ORAL_TABLET | Freq: Four times a day (QID) | ORAL | Status: DC | PRN

## 2015-10-08 MED ORDER — BENZTROPINE MESYLATE 0.5 MG PO TABS: 0.5000 mg | ORAL_TABLET | Freq: Two times a day (BID) | ORAL | Status: DC | PRN

## 2015-10-08 MED ORDER — TRIAMCINOLONE ACETONIDE 0.1 % EX CREA: TOPICAL_CREAM | Freq: Two times a day (BID) | CUTANEOUS | Status: DC

## 2015-10-08 MED ORDER — TRAZODONE HCL 100 MG PO TABS
100.0000 mg | ORAL_TABLET | Freq: Every day | ORAL | Status: DC
  Filled 2015-10-08: qty 7

## 2015-10-08 MED ORDER — NICOTINE POLACRILEX 2 MG MT GUM: 2.0000 mg | CHEWING_GUM | OROMUCOSAL | Status: DC | PRN

## 2015-10-08 MED ORDER — SERTRALINE HCL 25 MG PO TABS
25.0000 mg | ORAL_TABLET | Freq: Every day | ORAL | Status: DC
  Filled 2015-10-08: qty 7

## 2015-10-08 MED ORDER — SERTRALINE HCL 25 MG PO TABS: 25.0000 mg | ORAL_TABLET | Freq: Every day | ORAL | Status: DC

## 2015-10-08 MED ORDER — TRAZODONE HCL 100 MG PO TABS: 100.0000 mg | ORAL_TABLET | Freq: Every evening | ORAL | Status: DC | PRN

## 2015-10-08 NOTE — Discharge Summary (Signed)
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 Hyperprolactinemia (HCC) [E22.1] 03/19/2015  . 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 Opioid use disorder, moderate, on maintenance therapy [F11.90] 03/17/2015  . Cannabis use disorder, moderate, in sustained remission [F12.90] 03/17/2015  . Psoriasis [L40.9] 03/17/2015  . Aggressive behavior of adult [F60.89]   . Opioid dependence with withdrawal (HCC) [F11.23]   . Psychoses [F29]   . Polysubstance abuse [F19.10] 04/28/2014   Musculoskeletal: Strength & Muscle Tone: within normal limits Gait & Station: normal Patient leans: N/A  Psychiatric Specialty Exam:  SEE MD SRA Physical Exam  Vitals reviewed. Constitutional: He is oriented to person, place, and time. He appears well-developed and well-nourished.  HENT:  Head: Normocephalic.  Eyes: Pupils are equal, round, and reactive to light.  Neck: Normal range of motion.  Cardiovascular: Normal rate.   Respiratory: Effort normal.  GI: Soft.  Genitourinary:  Denies any issues in this area  Musculoskeletal: Normal range of motion.  Neurological: He is alert and oriented to person, place, and time.  Skin: Skin is warm and dry.    Review of Systems  Constitutional: Negative.   HENT: Negative.   Eyes: Negative.   Respiratory: Negative.   Cardiovascular: Negative.   Gastrointestinal: Negative.    Genitourinary: Negative.   Musculoskeletal: Negative.   Skin: Negative.   Endo/Heme/Allergies: Negative.   Psychiatric/Behavioral: Positive for depression (Stable) and substance abuse (Opioid detox). Negative for suicidal ideas, hallucinations and memory loss. The patient has insomnia (Stable). The patient is not nervous/anxious.   All other systems reviewed and are negative. See Md's SRA  Blood pressure 105/78, pulse 90, temperature 98.3 F (36.8 C), temperature source Oral, resp. rate 16, height  (1.727 m), weight 103.874 kg (229 lb).Body mass index is 34.83 kg/(m^2).  Have you used any form of tobacco in the last 30 days? (Cigarettes, Smokeless Tobacco, Cigars, and/or Pipes): Yes  Has this patient used any form of tobacco in the last 30 days? (Cigarettes, Smokeless Tobacco, Cigars, and/or Pipes) N/A  Past Medical History:  Past Medical History  Diagnosis Date  . Abscess   . Heroin addiction (HCC)   . GERD (gastroesophageal reflux disease)    History reviewed. No pertinent past surgical history. Family History:  Family History  Problem Relation Age of Onset  . Mental illness Neg Hx   . Hypertension Neg Hx   . Anxiety disorder Mother    Social History:  History  Alcohol Use No    Comment: former  has not drank in 2 years      History  Drug Use  . Yes  . Special: Other-see comments, Cocaine, Marijuana, Heroin    Comment: abuses Unisom, hx of cannabis abuse, bzd abuse , stimulant abuse    Social History   Social History  . Marital Status: Single    Spouse Name: N/A  . Number of Children: N/A  . Years of Education: N/A   Social History Main  Topics  . Smoking status: Current Every Day Smoker -- 2.00 packs/day for 0 years    Types: Cigarettes  . Smokeless tobacco: Never Used  . Alcohol Use: No     Comment: former  has not drank in 2 years   . Drug Use: Yes    Special: Other-see comments, Cocaine, Marijuana, Heroin     Comment: abuses Unisom, hx of cannabis  abuse, bzd abuse , stimulant abuse  . Sexual Activity: No   Other Topics Concern  . None   Social History Narrative   Risk to Self: Suicidal Ideation: Yes-Currently Present Suicidal Intent: Yes-Currently Present Is patient at risk for suicide?: Yes Suicidal Plan?: No Specify Current Suicidal Plan: None Reported  Access to Means: Yes Specify Access to Suicidal Means: Sleeping pills What has been your use of drugs/alcohol within the last 12 months?: Heroine How many times?: 1 Other Self Harm Risks: None Reported Triggers for Past Attempts: Unpredictable (Substance Abuse) Intentional Self Injurious Behavior: None Risk to Others: Homicidal Ideation: No Thoughts of Harm to Others: No Current Homicidal Intent: No Current Homicidal Plan: No Access to Homicidal Means: No Identified Victim: NA History of harm to others?: No Assessment of Violence: None Noted Violent Behavior Description: None Reported Does patient have access to weapons?: Yes (Comment) Criminal Charges Pending?: No Does patient have a court date: No Prior Inpatient Therapy: Prior Inpatient Therapy: No Prior Therapy Dates: NA Prior Therapy Facilty/Provider(s): NA Reason for Treatment: NA Prior Outpatient Therapy: Prior Outpatient Therapy: Yes Prior Therapy Dates: [place]  Prior Therapy Facilty/Provider(s): [facility] Reason for Treatment: Medication Management and OPT Does patient have an ACCT team?: No Does patient have Intensive In-House Services?  : No Does patient have [facility] services? : Yes Does patient have P4CC services?: No  Level of Care:  OP  Hospital Course:  35 Y/O male who states he was having real bad panic attacks while at work. States he was going trough withdrawal from opiate use (heroin) and "all" pills. Was on methadone for 2 years. Went off. Started using 5-6 months ago. 60 mg of methadone. States his father cut him off as did not want to pay for it anymore. Admits to Persistent depression. Has  also experienced some hallucinations. Hears people talking among each other.  [name] was admitted to the unit with his UDS test results positive for Opioid, Benzodiazepine, Cocaine & THC. He did admit to having been using heroin & other illegal drugs. Although, not actively suicidal, he was having worsening symptoms of depression, possibly substance induced. He was in need of opioid detox as well as mood stabilization treatments. After admission assessment/ evaluation, his presenting symptoms were identified. The medication regimen targeting those symptoms were discussed & initiated. He received Clonidine detoxification treatment protocols to combat the withdrawal symptoms of opioid. He was also medicated & discharged on; Cogentin 0.5 mg for prevention of EPS, Hydroxyzine 25 mg prn for anxiety, Latuda 60 mg for mood control, Sertraline 25 mg for depression & Trazodone 100 mg for insomnia. He was also enrolled & participated in the group counseling sessions being offered & held on this unit. He learned coping skills that should help him further to cope better & manage his depression/substance abuse issues after discharge. Part of his treatment regimen & discharge plans is for an appointment to a psychiatric clinic to continue substance abuse treatment, routine psychiatric care & medication managment.  [place]annah has completed detox treatment & his mood is stable. This is evidenced by his reports of  improved mood, absence of suicidal ideations & or substance withdrawal symptoms. Upon discharge, Maisie Fushomas adamantly denies any SIHI, AVH, delusional thoughts, paranoia or substance withdrawal symptoms. He is provided with a 7 days worth, supply samples of his [facility]. [facility] in no apparent distress. Transportation per self.  Consults:  psychiatry  Discharge Vitals:   Blood pressure 105/78, pulse 90, temperature 98.3 F (36.8 C), temperature source Oral, resp. rate 16,  height 5\' 8"  (1.727 m), weight 103.874 kg (229 lb). Body mass index is 34.83 kg/(m^2). Lab Results:   Results for orders placed or performed during the hospital encounter of 10/06/15 (from the past 72 hour(s))  Lipid panel     Status: Abnormal   Collection Time: 10/07/15  6:21 AM  Result Value Ref Range   Cholesterol 192 0 - 200 mg/dL   Triglycerides 409276 (H) <150 mg/dL   HDL 26 (L) >81>40 mg/dL   Total CHOL/HDL Ratio 7.4 RATIO   VLDL 55 (H) 0 - 40 mg/dL   LDL Cholesterol 191111 (H) 0 - 99 mg/dL    Comment:        Total Cholesterol/HDL:CHD Risk Coronary Heart Disease Risk Table                     Men   Women  1/2 Average Risk   3.4   3.3  Average Risk       5.0   4.4  2 X Average Risk   9.6   7.1  3 X Average Risk  23.4   11.0        Use the calculated Patient Ratio above and the CHD Risk Table to determine the patient's CHD Risk.        ATP III CLASSIFICATION (LDL):  <100     mg/dL   Optimal  [id number]  mg/dL   Near or Above                    Optimal  130-159  mg/dL   Borderline  [id number]  mg/dL   High  >657>190     mg/dL   Very High Performed at [facility]   Hemoglobin A1c     Status: Abnormal   Collection Time: 10/07/15  6:21 AM  Result Value Ref Range   Hgb A1c MFr Bld 6.2 (H) 4.8 - 5.6 %    Comment: (NOTE)         Pre-diabetes: 5.7 - 6.4         Diabetes: >6.4         Glycemic control for adults with diabetes: <7.0    Mean Plasma Glucose 131 mg/dL    Comment: (NOTE) Performed At: [facility] [address] [place], [place] [id number] Mila HomerHancock William F MD WU:[phone] Performed at [facility]    Physical Findings: AIMS: Facial and Oral Movements Muscles of Facial Expression: None, normal Lips and Perioral Area: None, normal Jaw: None, normal Tongue: None, normal,Extremity Movements Upper (arms, wrists, hands, fingers): None, normal Lower (legs, knees, ankles, toes): None, normal, Trunk Movements Neck, shoulders, hips: None,  normal, Overall Severity Severity of abnormal movements (highest score from questions above): None, normal Incapacitation due to abnormal movements: None, normal Patient's awareness of abnormal movements (rate only patient's report): No Awareness, Dental Status Current problems with teeth and/or dentures?: No Does patient usually wear dentures?: No  CIWA:    COWS:  COWS Total Score: 5  See Psychiatric Specialty Exam and Suicide Risk Assessment completed by Attending Physician prior to discharge.  Discharge destination:  Home  Is patient on multiple antipsychotic therapies at discharge:  No   Has Patient had three or more failed trials of antipsychotic monotherapy by history:  No  Recommended Plan for Multiple Antipsychotic Therapies: NA    Medication List    STOP taking these medications        doxylamine (Sleep) 25 MG tablet  Commonly known as:  UNISOM     haloperidol 2 MG tablet  Commonly known as:  HALDOL      TAKE these medications      Indication   benztropine 0.5 MG tablet  Commonly known as:  COGENTIN  Take 1 tablet (0.5 mg total) by mouth 2 (two) times daily as needed for tremors (eps).   Indication:  Extrapyramidal Reaction caused by Medications     hydrOXYzine 25 MG tablet  Commonly known as:  ATARAX/VISTARIL  Take 1 tablet (25 mg total) by mouth every 6 (six) hours as needed for anxiety.   Indication:  Anxiety     Lurasidone HCl 60 MG Tabs  Take 60 mg by mouth daily with breakfast. For mood control   Indication:  Mood control     nicotine polacrilex 2 MG gum  Commonly known as:  NICORETTE  Take 1 each (2 mg total) by mouth as needed for smoking cessation.   Indication:  Nicotine Addiction     sertraline 25 MG tablet  Commonly known as:  ZOLOFT  Take 1 tablet (25 mg total) by mouth daily. For depression   Indication:  Major Depressive Disorder     traZODone 100 MG tablet  Commonly known as:  DESYREL  Take 1 tablet (100 mg total) by mouth at bedtime  as needed for sleep.   Indication:  Trouble Sleeping     triamcinolone cream 0.1 %  Commonly known as:  KENALOG  Apply topically 2 (two) times daily. For itching   Indication:  Skin Inflammation, Itching       Follow-up Information    Follow up with [facility].   Why:  If you would like to be seen prior to your prescheduled appt(s), please [facility] 8am-9am Monday through Friday for hospital follow-up. Thank you.    Contact information:   [address], [place] [address] Phone: [phone] Fax: [phone]     Follow-up recommendations: Activity:  As tolerated Diet: As recommended by your primary care doctor. Keep all scheduled follow-up appointments as recommended.   Comments:  Take all your medications as prescribed by your mental healthcare provider. Report any adverse effects and or reactions from your medicines to your outpatient provider promptly. Patient is instructed and cautioned to not engage in alcohol and or illegal drug use while on prescription medicines. In the event of worsening symptoms, patient is instructed to call the crisis hotline, 911 and or go to the nearest ED for appropriate evaluation and treatment of symptoms. Follow-up with your primary care provider for your other medical issues, concerns and or health care needs.  Signed: Armandina Stammer I PMHNP, FNP-BC 10/08/2015, 3:15 PM  I personally assessed the patient and formulated the plan Madie Reno A. Dub Mikes, M.D.

## 2015-10-08 NOTE — Tx Team (Signed)
Interdisciplinary Treatment Plan Update (Adult)  Date:  10/08/2015  Time Reviewed:  10:55 AM   Progress in Treatment: Attending groups: Intermittently Participating in groups:  Yes, when he attends  Taking medication as prescribed:  Yes. Tolerating medication:  Yes. Family/Significant othe contact made:  SPE completed with pt, as he declined to consent to family contact.  Patient understands diagnosis:  Yes. and As evidenced by:  seeking treatment for overdose attempt, SI, depression, heroin abuse, and for medication stabilization. Discussing patient identified problems/goals with staff:  Yes. Medical problems stabilized or resolved:  Yes. Denies suicidal/homicidal ideation: Yes. Issues/concerns per patient self-inventory:  Other:   Discharge Plan or Barriers: Pt plans to return home; follow-up at [facility] for mental health services. Pt has been given Mental Health Association information, AA/NA pamphlets.   Reason for Continuation of Hospitalization: none  Comments:  [name] is an 35 y.o. male that is a walk in at [facility]. Patient reports that he took 86 unisom sleeping pills two days ago in an attempt to kill himself. Patient reports increased depression associated with his inability to stop abusing heroin. Patient reports that he has been abusing heroin since the age of 53. Patient reports that he has severe panic attacks if he thinks that he will not be able to have heroin. Patient reports that his longest period of sobriety was for 2 years. Patient reports Withdrawal symptoms. Patient reports that his last use was yesterday. Patient repots that he only uses when he gets paid and he does not remember how much he uses. Patient reports that he has been hearing voices belittling him. Patient reports that he has been prescribed psychiatric medication from [facility] but he does not believe that it is working. Patient reports that he has been compliant with taking the medication.  Patient denies physical, sexual or emotional abuse. Patient Denies HI. Diagnosis: Major Depressive Disorder; Anxiety Disorder Opiate Abuse, Severe  Estimated length of stay:  D/c today   Additional Comments:  Patient and CSW reviewed pt's identified goals and treatment plan. Patient verbalized understanding and agreed to treatment plan. CSW reviewed [facility] "Discharge Process and Patient Involvement" Form. Pt verbalized understanding of information provided and signed form.    Review of initial/current patient goals per problem list:  1. Goal(s): Patient will participate in aftercare plan  Met: Yes  Target date: at discharge  As evidenced by: Patient will participate within aftercare plan AEB aftercare provider and housing plan at discharge being identified.  5/3: CSW assessing for appropriate referrals. Current with [facility] for medication management.   5/5: Pt reports that he will return home; resume care at [facility].   2. Goal (s): Patient will exhibit decreased depressive symptoms and suicidal ideations.  Met: Yes   Target date: at discharge  As evidenced by: Patient will utilize self rating of depression at 3 or below and demonstrate decreased signs of depression or be deemed stable for discharge by MD.  5/3: Pt rates depression as high. Denies SI/Hi/AVH today.   5/5: Pt rates depression as 0/10 and presents with pleasant mood/calm affect. Denies SI/HI/AVH.   3. Goal(s): Patient will demonstrate decreased signs of withdrawal due to substance abuse  Met:Yes   Target date:at discharge   As evidenced by: Patient will produce a CIWA/COWS score of 0, have stable vitals signs, and no symptoms of withdrawal.  5/3: Pt reports moderate withdrawal symptoms with COWS of 5 and high standing pulse.   5/5: Pt reports no signs of withdrawal with  COWS not taken today and stable vitals.   Attendees: Patient:   10/08/2015 10:55 AM   Family:   10/08/2015 10:55 AM   Physician:  Dr.  Carlton Adam, MD 10/08/2015 10:55 AM   Nursing:   De Burrs RN 10/08/2015 10:55 AM   Clinical Social Worker: Maxie Better, LCSW 10/08/2015 10:55 AM   Clinical Social Worker: Erasmo Downer Drinkard LCSW 10/08/2015 10:55 AM   Other:  Gerline Legacy Nurse Case Manager 10/08/2015 10:55 AM   Other:  Agustina Caroli NP 10/08/2015 10:55 AM   Other:   10/08/2015 10:55 AM   Other:  10/08/2015 10:55 AM   Other:  10/08/2015 10:55 AM   Other:  10/08/2015 10:55 AM    10/08/2015 10:55 AM    10/08/2015 10:55 AM    10/08/2015 10:55 AM    10/08/2015 10:55 AM    Scribe for Treatment Team:   Maxie Better, LCSW 10/08/2015 10:55 AM

## 2015-10-08 NOTE — Progress Notes (Signed)
Patient ID: [name], male   DOB: 1980-11-13, 35 y.o.   MRN: [id number]  Pt currently presents with a masked affect and cooperative behavior. Per self inventory, pt rates depression, hopelessness and anxiety at a 0. Pt's daily goal is to "staying clean when I leave" and they intend to do so by "go strait home." Pt reports poor sleep, a good appetite, normal energy and good concentration. Pt reports that he did not sleep well due to the thundering and raining last night.   Pt provided with medications per providers orders. Pt's labs and vitals were monitored throughout the day. Pt supported emotionally and encouraged to express concerns and questions. Pt educated on medications and suicide prevention resources.   Pt's safety ensured with 15 minute and environmental checks. Pt currently denies SI/HI and A/V hallucinations. Pt verbally agrees to seek staff if SI/HI or A/VH occurs and to consult with staff before acting on these thoughts. Pt to be discharged today per MD. Will continue POC.

## 2015-10-08 NOTE — Progress Notes (Signed)
  [facility] Adult Case [facility] Discharge Plan :  Will you be returning to the same living situation after discharge:  Yes,  home At discharge, do you have transportation home?: Yes,  Car in parking lot.  Do you have the ability to pay for your medications: Yes,  mental health  Release of information consent forms completed and submitted to medical records by CSW.  Patient to Follow up at: Follow-up Information    Follow up with [facility].   Why:  If you would like to be seen prior to your prescheduled appt(s), please [facility] between 8am-9am Monday through Friday for hospital follow-up. Thank you.    Contact information:   [address] [place], [place] [address] Phone: [phone] Fax: [phone]      Next level of care provider has access to [facility] Link:no  Safety Planning and Suicide Prevention discussed: Yes,  SPE completed with pt, as he declined to consent to family contact. SPI pamphlet and Mobile Crisis information provided to pt and he was encouraged to share information with his support network.   Have you used any form of tobacco in the last 30 days? (Cigarettes, Smokeless Tobacco, Cigars, and/or Pipes): Yes  Has patient been referred to the Quitline?: Patient refused referral  Patient has been referred for addiction treatment: Yes  Smart, Shawneen Deetz LCSW 10/08/2015, 10:53 AM

## 2015-10-08 NOTE — Progress Notes (Signed)
Recreation Therapy Notes  Date: 05.05.2017 Time: 9:30am Location: 30 Hall Group Room   Group Topic: Stress Management  Goal Area(s) Addresses:  Patient will actively participate in stress management techniques presented during session.   Behavioral Response: Did not attend   Jearl Klinefelterenise L Nikan Ellingson, LRT/CTRS        Sonam Wandel L 10/08/2015 9:59 AM

## 2015-10-08 NOTE — Progress Notes (Signed)
D: Pt reports an overall improvement of his withdrawal symptoms. Pt is seen as less anxious this evening. Pt was visible anda active within the milieu. Pt denied any SI/HI/AVH. Pt actively attended the evening wrap-up group.  A: Writer administered scheduled and prn medications to pt, per MD orders. Continued support and availability as needed was extended to this pt. Staff continues to monitor pt with q10915min checks.  R: No adverse drug reactions noted. Pt receptive to treatment. Pt remains safe at this time.

## 2015-10-08 NOTE — Progress Notes (Signed)
Patient ID: [name], male   DOB: 1981/05/22, 35 y.o.   MRN: [id number]   Pt discharged to lobby. Pt was stable and appreciative at that time. All papers and prescriptions were given and valuables returned. Verbal understanding expressed. Denies SI/HI and A/VH. Pt given opportunity to express concerns and ask questions.

## 2015-10-08 NOTE — [facility] Suicide Risk Assessment (Signed)
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 Hyperprolactinemia (HCC) [E22.1] 03/19/2015  . 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 Opioid use disorder, moderate, on maintenance therapy [F11.90] 03/17/2015  . Cannabis use disorder, moderate, in sustained remission [F12.90] 03/17/2015  . Psoriasis [L40.9] 03/17/2015  . Aggressive behavior of adult [F60.89]   . Opioid dependence with withdrawal (HCC) [F11.23]   . Psychoses [F29]   . Polysubstance abuse [F19.10] 04/28/2014    Total Time spent with patient: 20 minutes  Musculoskeletal: Strength & Muscle Tone: within normal limits Gait & Station: normal Patient leans: normal  Psychiatric Specialty Exam: Review of Systems  Constitutional: Negative.   HENT: Negative.   Eyes: Negative.   Respiratory: Negative.   Cardiovascular: Negative.   Gastrointestinal: Positive for heartburn.  Genitourinary: Negative.   Musculoskeletal: Negative.   Skin: Negative.   Neurological: Negative.   Endo/Heme/Allergies: Negative.   Psychiatric/Behavioral: Positive for substance abuse.    Blood pressure 105/78, pulse 90, temperature 98.3 F (36.8 C), temperature source Oral, resp. rate 16, height 5\' 8"  (1.727 m), weight 103.874 kg (229 lb).Body mass index is 34.83 kg/(m^2).  General Appearance: Fairly Groomed  Patent attorneyye Contact::  Fair  Speech:  Clear and Coherent409  Volume:  Normal  Mood:  Euthymic  Affect:  Appropriate  Thought Process:  Coherent and Goal Directed  Orientation:  Full (Time, Place, and Person)  Thought Content:  plans as he moves on relapse prevention plan  Suicidal Thoughts:  No  Homicidal Thoughts:  No  Memory:  Immediate;   Fair Recent;   Fair Remote;   Fair   Judgement:  Fair  Insight:  Shallow  Psychomotor Activity:  Normal  Concentration:  Fair  Recall:  FiservFair  Fund of Knowledge:Fair  Language: Fair  Akathisia:  No  Handed:  Right  AIMS (if indicated):     Assets:  Desire for Improvement Housing Social Support Transportation  Sleep:  Number of Hours: 3  Cognition: WNL  ADL's:  Intact  In full contact with reality. There are no active S/S of withdrawal. There are no active SI plans or intent. Willing and motivated to pursue outpatient treatment Mental Status Per Nursing Assessment::   On Admission:     Demographic Factors:  Male and Caucasian  Loss Factors: none identified  Historical Factors: none identified  Risk Reduction Factors:   Sense of responsibility to family, Living with another person, especially a relative and Positive social support  Continued Clinical Symptoms:  Alcohol/Substance Abuse/Dependencies  Cognitive Features That Contribute To Risk:  None    Suicide Risk:  Minimal: No identifiable suicidal ideation.  Patients presenting with no risk factors but with morbid ruminations; may be classified as minimal risk based on the severity of the depressive symptoms  Follow-up Information    Follow up with [facility].   Why:  appt needed prior to d/c.    Contact information:   [address] [place], [place] [address] Phone: [phone] Fax: [phone]      Plan Of Care/Follow-up recommendations:  Activity:  as tolerated Diet:  regular Follow up as above Chalonda Schlatter A, MD 10/08/2015, 10:36 AM

## 2016-01-21 ENCOUNTER — Ambulatory Visit (HOSPITAL_COMMUNITY)
Admission: EM | Admit: 2016-01-21 | Discharge: 2016-01-21 | Disposition: A | Discharge status: Home / Self Care | Payer: Self-pay | Attending: Physician Assistant | Admitting: Physician Assistant

## 2016-01-21 ENCOUNTER — Encounter (HOSPITAL_COMMUNITY): Payer: Self-pay | Admitting: Emergency Medicine

## 2016-01-21 DIAGNOSIS — L0291 Cutaneous abscess, unspecified: Secondary | ICD-10-CM

## 2016-01-21 MED ORDER — SULFAMETHOXAZOLE-TRIMETHOPRIM 800-160 MG PO TABS: 1.0000 | ORAL_TABLET | Freq: Two times a day (BID) | ORAL | 0 refills | Status: AC

## 2016-01-21 NOTE — ED Triage Notes (Signed)
Pt has an abscess on his left AC.  Pt cut the abscess himself four days ago and states he had a lot of drainage.  Pt has swelling today around the area, but the site is clean, dry, and intact.  He denies any illness or fever.

## 2016-01-26 ENCOUNTER — Emergency Department (HOSPITAL_COMMUNITY)
Admission: EM | Admit: 2016-01-26 | Discharge: 2016-01-26 | Disposition: A | Discharge status: Home / Self Care | Payer: Self-pay | Attending: Emergency Medicine | Admitting: Emergency Medicine

## 2016-01-26 ENCOUNTER — Emergency Department (HOSPITAL_COMMUNITY): Payer: Self-pay

## 2016-01-26 ENCOUNTER — Encounter (HOSPITAL_COMMUNITY): Payer: Self-pay | Admitting: Neurology

## 2016-01-26 DIAGNOSIS — L02414 Cutaneous abscess of left upper limb: Secondary | ICD-10-CM | POA: Insufficient documentation

## 2016-01-26 DIAGNOSIS — F17210 Nicotine dependence, cigarettes, uncomplicated: Secondary | ICD-10-CM | POA: Insufficient documentation

## 2016-01-26 DIAGNOSIS — L0291 Cutaneous abscess, unspecified: Secondary | ICD-10-CM

## 2016-01-26 MED ORDER — LIDOCAINE-EPINEPHRINE (PF) 2 %-1:200000 IJ SOLN
20.0000 mL | Freq: Once | INTRAMUSCULAR | Status: AC
  Administered 2016-01-26: 20 mL via INTRADERMAL
  Filled 2016-01-26: qty 20

## 2016-01-26 MED ORDER — CLINDAMYCIN HCL 150 MG PO CAPS
450.0000 mg | ORAL_CAPSULE | Freq: Once | ORAL | Status: AC
  Administered 2016-01-26: 450 mg via ORAL
  Filled 2016-01-26: qty 3

## 2016-01-26 MED ORDER — CLINDAMYCIN HCL 150 MG PO CAPS: 450.0000 mg | ORAL_CAPSULE | Freq: Three times a day (TID) | ORAL | 0 refills | Status: AC

## 2016-01-26 NOTE — ED Triage Notes (Signed)
Pt here with abscess to left forearm x 1 week. [facility] and [facility] given antibiotics and has taken all but 1. Has hardened area around redness. Is tender to touch.

## 2016-01-26 NOTE — ED Notes (Signed)
Patient transported to x-ray. ?

## 2016-01-26 NOTE — ED Provider Notes (Signed)
[facility]-EMERGENCY DEPT Provider Note   CSN: [id number] Arrival date & time: 01/26/16  1251     History   Chief Complaint Chief Complaint  Patient presents with  . Abscess    HPI Venora Mapleshomas E Krejci is a 35 y.o. male.  HPI  Patient with history of IVDU presents for abscess in L Ac.  Its started a week ago. [facility] put him on bactrim.  He said it initially improved some, but is swollen worse than before.  Associated with pain, warmth, redness.  No fevers, chills, other symptoms.  He denies IVDU recently.  Past Medical History:  Diagnosis Date  . Abscess   . GERD (gastroesophageal reflux disease)   . Heroin addiction [facility](HCC)     Patient Active Problem List   Diagnosis Date Noted  . Hyperprolactinemia (HCC) 03/19/2015  . 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 Opioid use disorder, moderate, on maintenance therapy 03/17/2015  . Cannabis use disorder, moderate, in sustained remission 03/17/2015  . Psoriasis 03/17/2015  . Aggressive behavior of adult   . Opioid dependence with withdrawal (HCC)   . Psychoses   . Polysubstance abuse 04/28/2014    History reviewed. No pertinent surgical history.     Home Medications    Prior to Admission medications   Medication Sig Start Date End Date Taking? Authorizing Provider  benztropine (COGENTIN) 0.5 MG tablet Take 1 tablet (0.5 mg total) by mouth 2 (two) times daily as needed for tremors (eps). 10/08/15   Sanjuana KavaAgnes I Nwoko, NP  hydrOXYzine (ATARAX/VISTARIL) 25 MG tablet Take 1 tablet (25 mg total) by mouth every 6 (six) hours as needed for anxiety. 10/08/15   Sanjuana KavaAgnes I Nwoko, NP  lurasidone 60 MG TABS Take 60 mg by mouth daily with breakfast. For mood control 10/08/15   Sanjuana KavaAgnes I Nwoko, NP  nicotine polacrilex (NICORETTE) 2 MG gum Take 1 each (2 mg total) by mouth as needed for smoking cessation. 10/08/15   Sanjuana KavaAgnes I Nwoko, NP  sertraline (ZOLOFT) 25 MG tablet Take 1 tablet (25 mg total) by  mouth daily. For depression 10/08/15   Sanjuana KavaAgnes I Nwoko, NP  sulfamethoxazole-trimethoprim (BACTRIM DS,SEPTRA DS) 800-160 MG tablet Take 1 tablet by mouth 2 (two) times daily. 01/21/16 01/28/16  Tharon AquasFrank C Patrick, PA  traZODone (DESYREL) 100 MG tablet Take 1 tablet (100 mg total) by mouth at bedtime as needed for sleep. 10/08/15   Sanjuana KavaAgnes I Nwoko, NP  triamcinolone cream (KENALOG) 0.1 % Apply topically 2 (two) times daily. For itching 10/08/15   Sanjuana KavaAgnes I Nwoko, NP    Family History Family History  Problem Relation Age of Onset  . Anxiety disorder Mother   . Mental illness Neg Hx   . Hypertension Neg Hx     Social History Social History  Substance Use Topics  . Smoking status: Current Every Day Smoker    Packs/day: 2.00    Years: 0.00    Types: Cigarettes  . Smokeless tobacco: Never Used  . Alcohol use No     Comment: former  has not drank in 2 years      Allergies   Review of patient's allergies indicates no known allergies.   Review of Systems Review of Systems  Constitutional: Negative for chills and fever.  HENT: Negative for ear pain and sore throat.   Eyes: Negative for pain and visual disturbance.  Respiratory: Negative for cough and shortness of breath.   Cardiovascular: Negative for chest pain and palpitations.  Gastrointestinal:  Negative for abdominal pain and vomiting.  Genitourinary: Negative for dysuria and hematuria.  Musculoskeletal: Negative for arthralgias and back pain.  Skin: Positive for wound. Negative for color change and rash.  Neurological: Negative for seizures and syncope.  All other systems reviewed and are negative.    Physical Exam Updated Vital Signs BP 124/72 (BP Location: Right Arm)   Pulse 83   Temp 97.6 F (36.4 C) (Oral)   Resp 16   Wt 106.6 kg   SpO2 98%   BMI 35.73 kg/m   Physical Exam  Constitutional: He appears well-developed and well-nourished.  HENT:  Head: Normocephalic and atraumatic.  Eyes: Conjunctivae are normal.  Neck: Neck  supple.  Cardiovascular: Normal rate and regular rhythm.   No murmur heard. Pulmonary/Chest: Effort normal and breath sounds normal. No respiratory distress.  Abdominal: Soft. There is no tenderness.  Musculoskeletal: He exhibits no edema.  L AC fossa / elbow: Inspection: warmth/tenderness/redness to [facility] fossa. ROM: full, with pain Strength: 5/5 in flexion and 5/5 in extension Pulses: distal pulses intact Sensation: distal sensation intact    Neurological: He is alert.  Skin: Skin is warm and dry.  Psychiatric: He has a normal mood and affect.  Nursing note and vitals reviewed.    ED Treatments / Results  Labs (all labs ordered are listed, but only abnormal results are displayed) Labs Reviewed - No data to display  EKG  EKG Interpretation None       Radiology No results found.  Procedures .Marland KitchenIncision and Drainage Date/Time: 01/26/2016 6:53 PM Performed by: Marcelina Morel Authorized by: Marcelina Morel   Consent:    Consent obtained:  Verbal   Consent given by:  Patient   Risks discussed:  Incomplete drainage, pain and infection   Alternatives discussed:  No treatment Location:    Type:  Abscess   Size:  3x3x2cm   Location:  Upper extremity   Upper extremity location:  Elbow   Elbow location:  L elbow Pre-procedure details:    Skin preparation:  Antiseptic wash Anesthesia (see MAR for exact dosages):    Anesthesia method:  Local infiltration   Local anesthetic:  Lidocaine 1% WITH epi Procedure type:    Complexity:  Simple Procedure details:    Incision types:  Single straight   Incision depth:  Subcutaneous   Scalpel blade:  11   Wound management:  Probed and deloculated, irrigated with saline and extensive cleaning   Drainage:  Bloody and purulent   Drainage amount:  Copious   Wound treatment:  Wound left open   Packing materials:  1/4 in iodoform gauze Post-procedure details:    Patient tolerance of procedure:  Tolerated well, no immediate  complications   (including critical care time)  Medications Ordered in ED Medications - No data to display   Initial Impression / Assessment and Plan / ED Course  I have reviewed the triage vital signs and the nursing notes.  Pertinent labs & imaging results that were available during my care of the patient were reviewed by me and considered in my medical decision making (see chart for details).  Clinical Course    Patient presents for abscess of left arm, failure of outpatient on the buttocks. Ultrasounds performed, as above shows fluid pocket. X-ray did not show any retained needles. Incision and drainage was performed. Wound was packed. Patient was placed on clindamycin. Asked him to establish care with a PCP for follow-up of wound recheck.  We have discussed the discharge plan, including  the plan for outpatient followup, and strict return precautions, including those that would require calling 911.     Final Clinical Impressions(s) / ED Diagnoses   Final diagnoses:  None    New Prescriptions New Prescriptions   No medications on file     Marcelina MorelMichael Hinda Lindor, MD 01/26/16 1854    Derwood KaplanAnkit Nanavati, MD 01/27/16 [phone]

## 2016-01-26 NOTE — ED Notes (Signed)
Patient verbalized understanding of discharge instructions and denies any further needs or questions at this time. VS stable. Patient ambulatory with steady gait, declined wheelchair. Escorted to ED entrance.  

## 2016-01-26 NOTE — Discharge Instructions (Signed)
Please take antibiotics as directed.  Please remove the packing after 2 days.

## 2016-01-28 LAB — AEROBIC CULTURE W GRAM STAIN (SUPERFICIAL SPECIMEN)

## 2016-01-28 LAB — AEROBIC CULTURE  (SUPERFICIAL SPECIMEN)

## 2016-01-29 ENCOUNTER — Telehealth (HOSPITAL_COMMUNITY): Payer: Self-pay

## 2016-01-29 NOTE — Telephone Encounter (Signed)
Post ED Visit - Positive Culture Follow-up  Culture report reviewed by antimicrobial stewardship pharmacist:  []  Enzo BiNathan Batchelder, Pharm.D. []  Celedonio MiyamotoJeremy Frens, Pharm.D., BCPS []  Garvin FilaMike Maccia, Pharm.D. []  Georgina PillionElizabeth Martin, Pharm.D., BCPS []  Golden BeachMinh Pham, [address] Rainbow BoulevardPharm.D., BCPS, AAHIVP []  Estella HuskMichelle Turner, Pharm.D., BCPS, AAHIVP []  Tennis Mustassie Stewart, Pharm.D. []  Sherle Poeob Vincent, Vermont[place]arm.D. Bernie CoveyX  Taylor Stone, Pharm.D.  Positive wound culture, few MRSA Treated with clindamycin, organism sensitive to the same and no further patient follow-up is required at this time.  William RightClark, [name] 01/29/2016, 12:25 PM

## 2016-05-16 ENCOUNTER — Ambulatory Visit (HOSPITAL_COMMUNITY)
Admission: RE | Admit: 2016-05-16 | Discharge: 2016-05-16 | Disposition: A | Discharge status: Home / Self Care | Payer: Self-pay | Attending: Psychiatry | Admitting: Psychiatry

## 2016-05-16 DIAGNOSIS — F1490 Cocaine use, unspecified, uncomplicated: Secondary | ICD-10-CM | POA: Insufficient documentation

## 2016-05-16 DIAGNOSIS — F17210 Nicotine dependence, cigarettes, uncomplicated: Secondary | ICD-10-CM | POA: Insufficient documentation

## 2016-05-16 DIAGNOSIS — F1190 Opioid use, unspecified, uncomplicated: Secondary | ICD-10-CM | POA: Insufficient documentation

## 2016-05-16 NOTE — BH Assessment (Signed)
Tele Assessment Note   [name] is an 35 y.o. male [facility] to heroine and cocaine use. Patient reports using 3 to 6 bags of heroine a day and cocaine $20 to $40 dollars a day.  Patient denies SI, HI , and A/V. Reports symptoms of depression including decreased appetitive and sleep, low self esteem- loss of teeth and personal appearance, increased anger-road rage. Patient has history of psychosis due to substance use but currently denies any symptoms for 3  months. Patient presented with disheveled appearance, depressed, head down during part of the interview, blunted affect.   [facility] and drug use in the past. [facility]. Patient expressed interest in a methadone or suboxone program. Denies ability to pay the $14 dollars a day for treatment. Patient reports he does not pay for his drugs so he finds this easier to maintain his street drug use, "i'm a hustler."   Patients father gave him an ultimatim if he did not come for treatment.   Fransisca KaufmannLaura Davis NP recommended referrals for methadone or suboxone treatment.      Diagnosis: Opioid use disorder; Cocaine use disorder  Past Medical History:  Past Medical History:  Diagnosis Date  . Abscess   . GERD (gastroesophageal reflux disease)   . Heroin addiction (HCC)     No past surgical history on file.  Family History:  Family History  Problem Relation Age of Onset  . Anxiety disorder Mother   . Mental illness Neg Hx   . Hypertension Neg Hx     Social History:  reports that he has been smoking Cigarettes.  He has been smoking about 2.00 packs per day for the past 0.00 years. He has never used smokeless tobacco. He reports that he uses drugs, including Other-see comments, Cocaine, Marijuana, and Heroin. He reports that he does not drink alcohol.  Additional Social History:  Alcohol / Drug Use Pain Medications: see  MAR Prescriptions: see MAR Over the Counter: see MAR History of alcohol / drug use?: Yes Substance #1 Name of Substance 1: Heroine 1 - Amount (size/oz): 3 to 6 bags, IV 1 - Frequency: daily 1 - Duration: years Substance #2 Name of Substance 2: Cocaine 2 - Amount (size/oz): IV- $20 to $40 a day  CIWA: CIWA-Ar BP: 126/81 Pulse Rate: 91 COWS:    PATIENT STRENGTHS: (choose at least two) Average or above average intelligence General fund of knowledge  Allergies: No Known Allergies  Home Medications:  (Not in a hospital admission)  OB/GYN Status:  No LMP for male patient.  General Assessment Data [facility] TTS Assessment: In system Is this a Tele or Face-to-Face Assessment?: Face-to-Face Is this an Initial Assessment or a Re-assessment for this encounter?: Initial Assessment Marital status: Single Is patient pregnant?: No Living Arrangements: Parent Can pt return to current living arrangement?: Yes Admission Status: Voluntary Is patient capable of signing voluntary admission?: Yes Referral Source: Self/Family/Friend Insurance type: self pay  Medical Screening Exam [facility] Walk-in ONLY) Medical Exam completed: Yes  Crisis Care Plan Living Arrangements: Parent [facility] Name of Therapist: [facility]  Education Status Is patient currently in school?: No  Risk to self with the past 6 months Suicidal Ideation: No Has patient been a risk to self within the past 6 months prior to admission? : No Suicidal Intent: No Has patient had any suicidal intent within the  past 6 months prior to admission? : No Is patient at risk for suicide?: No Suicidal Plan?: No Has patient had any suicidal plan within the past 6 months prior to admission? : No Access to Means: No What has been your use of drugs/alcohol within the last 12 months?: cannabis, alcohol, Heroine, Cocaine Previous Attempts/Gestures: No How many times?: 0 Other  Self Harm Risks: 0 Intentional Self Injurious Behavior: None Family Suicide History: Unknown Recent stressful life event(s): Other (Comment) (Drug use) Persecutory voices/beliefs?: No Depression: Yes Depression Symptoms: Feeling angry/irritable, Isolating Substance abuse history and/or treatment for substance abuse?: Yes Suicide prevention information given to non-admitted patients: Not applicable  Risk to Others within the past 6 months Homicidal Ideation: No Does patient have any lifetime risk of violence toward others beyond the six months prior to admission? : No Thoughts of Harm to Others: No Current Homicidal Intent: No Current Homicidal Plan: No Access to Homicidal Means: No History of harm to others?: No Assessment of Violence: None Noted Does patient have access to weapons?: No Criminal Charges Pending?: No Does patient have a court date: No Is patient on probation?: No  Psychosis Hallucinations: None noted Delusions: None noted  Mental Status Report Appearance/Hygiene: Disheveled Eye Contact: Fair Motor Activity: Unremarkable Speech: Logical/coherent Level of Consciousness: Alert Mood: Depressed, Ashamed/humiliated, Worthless, low self-esteem Affect: Flat Anxiety Level: None Thought Processes: Coherent, Relevant Judgement: Unimpaired Orientation: Person, Place, Time, Situation Obsessive Compulsive Thoughts/Behaviors: None  Cognitive Functioning Concentration: Normal Memory: Recent Intact, Remote Intact IQ: Average Insight: Good Impulse Control: Poor Appetite: Poor Sleep: Decreased  ADLScreening ([facility] Assessment Services) Patient's cognitive ability adequate to safely complete daily activities?: Yes Patient able to express need for assistance with ADLs?: Yes Independently performs ADLs?: Yes (appropriate for developmental age)  Prior Inpatient Therapy Prior Inpatient Therapy: Yes Prior Therapy Dates: 2017, 2016, 2015 Prior [facility] Reason for Treatment: psychosis, substance use  Prior Outpatient Therapy Prior Outpatient Therapy: Yes Prior Therapy Dates: current Prior Therapy Facilty/Provider(s): [facility] Reason for Treatment: anxiety, depression, psychosis Does patient have an ACCT team?: No Does patient have Intensive In-House Services?  : No Does patient have [facility] services? : Yes Does patient have P4CC services?: No  ADL Screening (condition at time of admission) Patient's cognitive ability adequate to safely complete daily activities?: Yes Is the patient deaf or have difficulty hearing?: No Does the patient have difficulty seeing, even when wearing glasses/contacts?: No Does the patient have difficulty concentrating, remembering, or making decisions?: Yes Patient able to express need for assistance with ADLs?: Yes Does the patient have difficulty dressing or bathing?: No Independently performs ADLs?: Yes (appropriate for developmental age)       Abuse/Neglect Assessment (Assessment to be complete while patient is alone) Physical Abuse: Denies Verbal Abuse: Denies Sexual Abuse: Denies     Merchant navy officerAdvance Directives (For Healthcare) Does Patient Have a Medical Advance Directive?: No    Additional Information 1:1 In Past 12 Months?: No CIRT Risk: No Elopement Risk: No Does patient have medical clearance?: No     Disposition:  Disposition Initial Assessment Completed for this Encounter: Yes Disposition of Patient: Outpatient treatment Type of outpatient treatment: Chemical Dependence - Intensive Outpatient  Vonzell Schlattershley H [facility] 05/16/2016 3:30 PM

## 2016-05-16 NOTE — H&P (Signed)
Behavioral Health Medical Screening Exam  [name] is an 35 y.o. male who presented as a walk in requesting resources for substance abuse. Patient states "I want to stop using IV cocaine and heroin. It affects my work performance when I start withdrawing. I was on methadone in the past."   Total Time spent with patient: 20 minutes  Psychiatric Specialty Exam: Physical Exam  Constitutional: He is oriented to person, place, and time. He appears well-developed and well-nourished.  HENT:  Head: Normocephalic and atraumatic.  Neck: Normal range of motion.  Cardiovascular: Normal rate, regular rhythm, normal heart sounds and intact distal pulses.   Respiratory: Effort normal and breath sounds normal.  GI: Soft. Bowel sounds are normal.  Musculoskeletal: Normal range of motion.  Neurological: He is alert and oriented to person, place, and time.  Skin: Skin is warm and dry.    Review of Systems  Psychiatric/Behavioral: Positive for substance abuse. Negative for depression, hallucinations, memory loss and suicidal ideas. The patient is not nervous/anxious and does not have insomnia.     Blood pressure 126/81, pulse 91, temperature 98.8 F (37.1 C), SpO2 97 %.There is no height or weight on file to calculate BMI.  General Appearance: Casual  Eye Contact:  Good  Speech:  Clear and Coherent  Volume:  Normal  Mood:  Anxious  Affect:  Appropriate  Thought Process:  Coherent and Goal Directed  Orientation:  Full (Time, Place, and Person)  Thought Content:  Desire to stop using drugs  Suicidal Thoughts:  No  Homicidal Thoughts:  No  Memory:  Immediate;   Good Recent;   Good Remote;   Good  Judgement:  Poor  Insight:  Present  Psychomotor Activity:  Normal  Concentration: Concentration: Good and Attention Span: Good  Recall:  Good  Fund of Knowledge:Good  Language: Good  Akathisia:  No  Handed:  Right  AIMS (if indicated):     Assets:  Communication Skills Desire for  Improvement Housing Intimacy Leisure Time Physical Health Resilience Vocational/Educational  Sleep:       Musculoskeletal: Strength & Muscle Tone: within normal limits Gait & Station: normal Patient leans: N/A  Blood pressure 126/81, pulse 91, temperature 98.8 F (37.1 C), SpO2 97 %.  Recommendations:  Based on my evaluation the patient does not appear to have an emergency medical condition.  Fransisca KaufmannAVIS, Sacha Topor, NP 05/16/2016, 3:21 PM

## 2017-03-01 ENCOUNTER — Encounter (HOSPITAL_COMMUNITY): Payer: Self-pay | Admitting: Emergency Medicine

## 2017-03-01 ENCOUNTER — Emergency Department (HOSPITAL_COMMUNITY)
Admission: EM | Admit: 2017-03-01 | Discharge: 2017-03-01 | Disposition: A | Discharge status: Home / Self Care | Payer: Self-pay | Attending: Emergency Medicine | Admitting: Emergency Medicine

## 2017-03-01 DIAGNOSIS — R42 Dizziness and giddiness: Secondary | ICD-10-CM | POA: Insufficient documentation

## 2017-03-01 DIAGNOSIS — Z5321 Procedure and treatment not carried out due to patient leaving prior to being seen by health care provider: Secondary | ICD-10-CM | POA: Insufficient documentation

## 2017-03-01 LAB — BASIC METABOLIC PANEL
ANION GAP: 10 (ref 5–15)
BUN: 11 mg/dL (ref 6–20)
CHLORIDE: 102 mmol/L (ref 101–111)
CO2: 24 mmol/L (ref 22–32)
Calcium: 9.4 mg/dL (ref 8.9–10.3)
Creatinine, Ser: 0.87 mg/dL (ref 0.61–1.24)
GFR calc non Af Amer: >60 mL/min (ref >60)
Glucose, Bld: 125 mg/dL — ABNORMAL HIGH (ref 65–99)
Potassium: 3.7 mmol/L (ref 3.5–5.1)
Sodium: 136 mmol/L (ref 135–145)

## 2017-03-01 LAB — CBC
HCT: 45.1 % (ref 39.0–52.0)
HEMOGLOBIN: 15.2 g/dL (ref 13.0–17.0)
MCH: 30.4 pg (ref 26.0–34.0)
MCHC: 33.7 g/dL (ref 30.0–36.0)
MCV: 90.2 fL (ref 78.0–100.0)
Platelets: 233 10*3/uL (ref 150–400)
RBC: 5 MIL/uL (ref 4.22–5.81)
RDW: 14.1 % (ref 11.5–15.5)
WBC: 11.3 10*3/uL — ABNORMAL HIGH (ref 4.0–10.5)

## 2017-03-01 NOTE — ED Notes (Signed)
Called patient for repeat vitals and urine specimen in lobby and triage. No answer.

## 2017-03-01 NOTE — ED Notes (Signed)
Second call in lobby. No response. 

## 2017-03-01 NOTE — ED Notes (Signed)
Third call in lobby. No response. 

## 2017-03-01 NOTE — ED Notes (Signed)
This RN checked entire lobby, pt not in waiting room. Will d/c pt from system

## 2017-03-01 NOTE — ED Triage Notes (Signed)
Pt states he is being tapered down on his methadone and couldn't sleep well last night. Pt states he took 4 pills of the  of Nyquil to help him sleep. He took these pills approx an hour ago. Pt here because he feels lightheaded after taking this medication. Pt also states he feels paranoid. Pt states he just wants to be here to be monitored until he feels better.

## 2017-03-24 ENCOUNTER — Encounter (HOSPITAL_COMMUNITY): Payer: Self-pay | Admitting: Emergency Medicine

## 2017-03-24 ENCOUNTER — Emergency Department (HOSPITAL_COMMUNITY)
Admission: EM | Admit: 2017-03-24 | Discharge: 2017-03-24 | Disposition: A | Discharge status: Home / Self Care | Payer: Self-pay | Attending: Emergency Medicine | Admitting: Emergency Medicine

## 2017-03-24 ENCOUNTER — Telehealth (HOSPITAL_BASED_OUTPATIENT_CLINIC_OR_DEPARTMENT_OTHER): Payer: Self-pay | Admitting: Emergency Medicine

## 2017-03-24 ENCOUNTER — Emergency Department (HOSPITAL_COMMUNITY): Payer: Self-pay

## 2017-03-24 DIAGNOSIS — E876 Hypokalemia: Secondary | ICD-10-CM | POA: Diagnosis present

## 2017-03-24 DIAGNOSIS — M729 Fibroblastic disorder, unspecified: Secondary | ICD-10-CM | POA: Diagnosis present

## 2017-03-24 DIAGNOSIS — R609 Edema, unspecified: Secondary | ICD-10-CM | POA: Diagnosis present

## 2017-03-24 DIAGNOSIS — M609 Myositis, unspecified: Secondary | ICD-10-CM | POA: Diagnosis present

## 2017-03-24 DIAGNOSIS — K219 Gastro-esophageal reflux disease without esophagitis: Secondary | ICD-10-CM | POA: Diagnosis present

## 2017-03-24 DIAGNOSIS — L02414 Cutaneous abscess of left upper limb: Secondary | ICD-10-CM | POA: Insufficient documentation

## 2017-03-24 DIAGNOSIS — E785 Hyperlipidemia, unspecified: Secondary | ICD-10-CM | POA: Diagnosis present

## 2017-03-24 DIAGNOSIS — A400 Sepsis due to streptococcus, group A: Principal | ICD-10-CM | POA: Diagnosis present

## 2017-03-24 DIAGNOSIS — Z79899 Other long term (current) drug therapy: Secondary | ICD-10-CM | POA: Insufficient documentation

## 2017-03-24 DIAGNOSIS — F1120 Opioid dependence, uncomplicated: Secondary | ICD-10-CM | POA: Diagnosis present

## 2017-03-24 DIAGNOSIS — E871 Hypo-osmolality and hyponatremia: Secondary | ICD-10-CM | POA: Diagnosis present

## 2017-03-24 DIAGNOSIS — F17210 Nicotine dependence, cigarettes, uncomplicated: Secondary | ICD-10-CM | POA: Diagnosis present

## 2017-03-24 DIAGNOSIS — E861 Hypovolemia: Secondary | ICD-10-CM | POA: Diagnosis present

## 2017-03-24 DIAGNOSIS — L03114 Cellulitis of left upper limb: Secondary | ICD-10-CM | POA: Insufficient documentation

## 2017-03-24 LAB — BLOOD CULTURE ID PANEL (REFLEXED)
Acinetobacter baumannii: NOT DETECTED
CANDIDA PARAPSILOSIS: NOT DETECTED
Candida albicans: NOT DETECTED
Candida glabrata: NOT DETECTED
Candida krusei: NOT DETECTED
Candida tropicalis: NOT DETECTED
ENTEROCOCCUS SPECIES: NOT DETECTED
Enterobacter cloacae complex: NOT DETECTED
Enterobacteriaceae species: NOT DETECTED
Escherichia coli: NOT DETECTED
HAEMOPHILUS INFLUENZAE: NOT DETECTED
Klebsiella oxytoca: NOT DETECTED
Klebsiella pneumoniae: NOT DETECTED
LISTERIA MONOCYTOGENES: NOT DETECTED
Neisseria meningitidis: NOT DETECTED
PSEUDOMONAS AERUGINOSA: NOT DETECTED
Proteus species: NOT DETECTED
SERRATIA MARCESCENS: NOT DETECTED
STAPHYLOCOCCUS AUREUS BCID: NOT DETECTED
STREPTOCOCCUS PNEUMONIAE: NOT DETECTED
STREPTOCOCCUS PYOGENES: DETECTED — AB
STREPTOCOCCUS SPECIES: DETECTED — AB
Staphylococcus species: NOT DETECTED
Streptococcus agalactiae: NOT DETECTED

## 2017-03-24 LAB — CBC
HEMATOCRIT: 44.5 % (ref 39.0–52.0)
HEMOGLOBIN: 15.7 g/dL (ref 13.0–17.0)
MCH: 31.2 pg (ref 26.0–34.0)
MCHC: 35.3 g/dL (ref 30.0–36.0)
MCV: 88.5 fL (ref 78.0–100.0)
Platelets: 204 10*3/uL (ref 150–400)
RBC: 5.03 MIL/uL (ref 4.22–5.81)
RDW: 13.7 % (ref 11.5–15.5)
WBC: 16.1 10*3/uL — ABNORMAL HIGH (ref 4.0–10.5)

## 2017-03-24 LAB — COMPREHENSIVE METABOLIC PANEL
ALT: 25 U/L (ref 17–63)
AST: 26 U/L (ref 15–41)
Albumin: 3.5 g/dL (ref 3.5–5.0)
Alkaline Phosphatase: 85 U/L (ref 38–126)
Anion gap: 11 (ref 5–15)
BUN: 7 mg/dL (ref 6–20)
CO2: 22 mmol/L (ref 22–32)
Calcium: 8.7 mg/dL — ABNORMAL LOW (ref 8.9–10.3)
Chloride: 99 mmol/L — ABNORMAL LOW (ref 101–111)
Creatinine, Ser: 0.86 mg/dL (ref 0.61–1.24)
GFR calc Af Amer: >60 mL/min (ref >60)
GFR calc non Af Amer: >60 mL/min (ref >60)
Glucose, Bld: 126 mg/dL — ABNORMAL HIGH (ref 65–99)
Potassium: 3.7 mmol/L (ref 3.5–5.1)
Sodium: 132 mmol/L — ABNORMAL LOW (ref 135–145)
Total Bilirubin: 0.9 mg/dL (ref 0.3–1.2)
Total Protein: 8.4 g/dL — ABNORMAL HIGH (ref 6.5–8.1)

## 2017-03-24 LAB — I-STAT CG4 LACTIC ACID, ED
LACTIC ACID, VENOUS: 1.71 mmol/L (ref 0.5–1.9)
Lactic Acid, Venous: 0.9 mmol/L (ref 0.5–1.9)

## 2017-03-24 LAB — CBC WITH DIFFERENTIAL/PLATELET
BASOS ABS: 0 10*3/uL (ref 0.0–0.1)
Basophils Relative: 0 %
EOS ABS: 0.1 10*3/uL (ref 0.0–0.7)
EOS PCT: 0 %
HCT: 42.4 % (ref 39.0–52.0)
HEMOGLOBIN: 14.4 g/dL (ref 13.0–17.0)
LYMPHS ABS: 3 10*3/uL (ref 0.7–4.0)
LYMPHS PCT: 20 %
MCH: 30.1 pg (ref 26.0–34.0)
MCHC: 34 g/dL (ref 30.0–36.0)
MCV: 88.5 fL (ref 78.0–100.0)
Monocytes Absolute: 1.1 10*3/uL — ABNORMAL HIGH (ref 0.1–1.0)
Monocytes Relative: 7 %
NEUTROS PCT: 73 %
Neutro Abs: 11.2 10*3/uL — ABNORMAL HIGH (ref 1.7–7.7)
PLATELETS: 188 10*3/uL (ref 150–400)
RBC: 4.79 MIL/uL (ref 4.22–5.81)
RDW: 13.5 % (ref 11.5–15.5)
WBC: 15.4 10*3/uL — AB (ref 4.0–10.5)

## 2017-03-24 LAB — BASIC METABOLIC PANEL
ANION GAP: 12 (ref 5–15)
BUN: 6 mg/dL (ref 6–20)
CHLORIDE: 98 mmol/L — AB (ref 101–111)
CO2: 24 mmol/L (ref 22–32)
Calcium: 8.8 mg/dL — ABNORMAL LOW (ref 8.9–10.3)
Creatinine, Ser: 0.88 mg/dL (ref 0.61–1.24)
GFR calc non Af Amer: >60 mL/min (ref >60)
Glucose, Bld: 133 mg/dL — ABNORMAL HIGH (ref 65–99)
POTASSIUM: 3.5 mmol/L (ref 3.5–5.1)
SODIUM: 134 mmol/L — AB (ref 135–145)

## 2017-03-24 LAB — URINALYSIS, ROUTINE W REFLEX MICROSCOPIC
BILIRUBIN URINE: NEGATIVE
Glucose, UA: NEGATIVE mg/dL
HGB URINE DIPSTICK: NEGATIVE
KETONES UR: NEGATIVE mg/dL
Leukocytes, UA: NEGATIVE
Nitrite: NEGATIVE
PROTEIN: NEGATIVE mg/dL
Specific Gravity, Urine: 1.018 (ref 1.005–1.030)
pH: 6 (ref 5.0–8.0)

## 2017-03-24 LAB — PROTIME-INR
INR: 1
PROTHROMBIN TIME: 13.1 s (ref 11.4–15.2)

## 2017-03-24 MED ORDER — ACETAMINOPHEN 325 MG PO TABS
650.0000 mg | ORAL_TABLET | Freq: Once | ORAL | Status: AC | PRN
Start: 2017-03-24 — End: 2017-03-24
  Administered 2017-03-24: 650 mg via ORAL

## 2017-03-24 MED ORDER — ACETAMINOPHEN 325 MG PO TABS
ORAL_TABLET | ORAL | Status: AC
  Filled 2017-03-24: qty 2

## 2017-03-24 MED ORDER — DOXYCYCLINE HYCLATE 100 MG PO CAPS: 100.0000 mg | ORAL_CAPSULE | Freq: Two times a day (BID) | ORAL | 0 refills | Status: DC

## 2017-03-24 MED ORDER — OXYCODONE-ACETAMINOPHEN 5-325 MG PO TABS: 1.0000 | ORAL_TABLET | ORAL | 0 refills | Status: DC | PRN

## 2017-03-24 MED ORDER — SODIUM CHLORIDE 0.9 % IV BOLUS (SEPSIS)
1000.0000 mL | Freq: Once | INTRAVENOUS | Status: AC
  Administered 2017-03-24: 1000 mL via INTRAVENOUS

## 2017-03-24 MED ORDER — OXYCODONE-ACETAMINOPHEN 5-325 MG PO TABS
ORAL_TABLET | ORAL | Status: AC
  Administered 2017-03-24: 1 via ORAL
  Filled 2017-03-24: qty 1

## 2017-03-24 MED ORDER — VANCOMYCIN HCL IN DEXTROSE 1-5 GM/200ML-% IV SOLN
1000.0000 mg | Freq: Once | INTRAVENOUS | Status: AC
  Administered 2017-03-24: 1000 mg via INTRAVENOUS
  Filled 2017-03-24: qty 200

## 2017-03-24 MED ORDER — LIDOCAINE HCL (PF) 1 % IJ SOLN
10.0000 mL | Freq: Once | INTRAMUSCULAR | Status: AC
  Administered 2017-03-24: 10 mL
  Filled 2017-03-24: qty 10

## 2017-03-24 MED ORDER — OXYCODONE-ACETAMINOPHEN 5-325 MG PO TABS
1.0000 | ORAL_TABLET | ORAL | Status: AC | PRN
  Administered 2017-03-24 (×2): 1 via ORAL
  Filled 2017-03-24: qty 1

## 2017-03-24 NOTE — ED Provider Notes (Signed)
[name]  [facility] EMERGENCY DEPARTMENT Provider Note   CSN: [id number] Arrival date & time: 03/24/17  0030     History   Chief Complaint Chief Complaint  Patient presents with  . Fever  . wound to left forearm    HPI [name] is a 36 y.o. male.  The history is provided by the patient.  He complains of an abscess in his left forearm for the last 4 days.  Pain is severe and he rates it at 10/10.  He has had subjective fever and chills and sweats.  He has treated himself with hot baths.  He does have history of prior abscess which has been treated with incision and drainage in the emergency department.  Past Medical History:  Diagnosis Date  . Abscess   . GERD (gastroesophageal reflux disease)   . Heroin addiction [facility])     Patient Active Problem List   Diagnosis Date Noted  . Hyperprolactinemia (HCC) 03/19/2015  . 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 Opioid use disorder, moderate, on maintenance therapy (HCC) 03/17/2015  . Cannabis use disorder, moderate, in sustained remission (HCC) 03/17/2015  . Psoriasis 03/17/2015  . Aggressive behavior of adult   . Opioid dependence with withdrawal (HCC)   . Psychoses (HCC)   . Polysubstance abuse (HCC) 04/28/2014    History reviewed. No pertinent surgical history.     Home Medications    Prior to Admission medications   Medication Sig Start Date End Date Taking? Authorizing Provider  benztropine (COGENTIN) 0.5 MG tablet Take 1 tablet (0.5 mg total) by mouth 2 (two) times daily as needed for tremors (eps). 10/08/15   Armandina Stammer I, NP  hydrOXYzine (ATARAX/VISTARIL) 25 MG tablet Take 1 tablet (25 mg total) by mouth every 6 (six) hours as needed for anxiety. 10/08/15   Armandina Stammer I, NP  lurasidone 60 MG TABS Take 60 mg by mouth daily with breakfast. For mood control 10/08/15   Armandina Stammer I, NP  nicotine polacrilex (NICORETTE) 2 MG gum  Take 1 each (2 mg total) by mouth as needed for smoking cessation. 10/08/15   Armandina Stammer I, NP  sertraline (ZOLOFT) 25 MG tablet Take 1 tablet (25 mg total) by mouth daily. For depression 10/08/15   Armandina Stammer I, NP  traZODone (DESYREL) 100 MG tablet Take 1 tablet (100 mg total) by mouth at bedtime as needed for sleep. 10/08/15   Armandina Stammer I, NP  triamcinolone cream (KENALOG) 0.1 % Apply topically 2 (two) times daily. For itching 10/08/15   Sanjuana Kava, NP    Family History Family History  Problem Relation Age of Onset  . Anxiety disorder Mother   . Mental illness Neg Hx   . Hypertension Neg Hx     Social History Social History  Substance Use Topics  . Smoking status: Current Every Day Smoker    Packs/day: 2.00    Years: 0.00    Types: Cigarettes  . Smokeless tobacco: Never Used  . Alcohol use No     Comment: former  has not drank in 2 years      Allergies   Patient has no known allergies.   Review of Systems Review of Systems  All other systems reviewed and are negative.    Physical Exam Updated Vital Signs BP (!) 152/84   Pulse (!) 131   Temp (!) 101.2 F (38.4 C) (Oral)   Ht 5\' 11"  (1.803 m)  Wt 108.9 kg (240 lb)   SpO2 97%   BMI 33.47 kg/m   Physical Exam  Nursing note and vitals reviewed.  36 year old male, resting comfortably and in no acute distress. Vital signs are significant for fever, hypertension, tachycardia. Oxygen saturation is 97%, which is normal. Head is normocephalic and atraumatic. PERRLA, EOMI. Oropharynx is clear. Neck is nontender and supple without adenopathy or JVD. Back is nontender and there is no CVA tenderness. Lungs are clear without rales, wheezes, or rhonchi. Chest is nontender. Heart has regular rate and rhythm without murmur. Abdomen is soft, flat, nontender without masses or hepatosplenomegaly and peristalsis is normoactive. Extremities have no cyanosis or edema, full range of motion is present.  Abscess is present  proximal left forearm.  No lymphangitic streaks seen.  Abscess measures 3 cm x 2 cm. Skin is warm and dry. Neurologic: Mental status is normal, cranial nerves are intact, there are no motor or sensory deficits.  ED Treatments / Results  Labs (all labs ordered are listed, but only abnormal results are displayed) Labs Reviewed  CBC - Abnormal; Notable for the following:       Result Value   WBC 16.1 (*)    All other components within normal limits  CULTURE, BLOOD (ROUTINE X 2)  CULTURE, BLOOD (ROUTINE X 2)  URINALYSIS, ROUTINE W REFLEX MICROSCOPIC  BASIC METABOLIC PANEL  PROTIME-INR  I-STAT CG4 LACTIC ACID, ED   Radiology Dg Chest 2 View  Result Date: 03/24/2017 CLINICAL DATA:  36 y/o M; body aches and fever. Sepsis protocol. Left forearm wound. EXAM: CHEST  2 VIEW COMPARISON:  03/16/2015 chest radiograph FINDINGS: Stable normal cardiac silhouette. Bones are unremarkable. No pleural effusion or pneumothorax. Bronchitic changes greatest in the lung bases. No focal consolidation. IMPRESSION: Bronchitic changes greatest in the lung bases. No focal consolidation. Electronically Signed   By: Mitzi HansenLance  Furusawa-Stratton M.D.   On: 03/24/2017 01:45    Procedures Procedures (including critical care time) EMERGENCY DEPARTMENT US SOFT TISSUE INTERPRETATION "Study: Limited Soft Tissue Ultrasound"  INDICATIONS: Soft tissue infection Multiple views of the body part were obtained in real-time with a multi-frequency linear probe  PERFORMED BY: Myself IMAGES ARCHIVED?: Yes SIDE:Left BODY PART:Upper extremity INTERPRETATION:  Abcess present    INCISION AND DRAINAGE Performed by: ZOXWR,UEAVWGLICK,Suprina Mandeville Consent: Verbal consent obtained. Risks and benefits: risks, benefits and alternatives were discussed Type: abscess  Body area: Left forearm  Anesthesia: local infiltration  Incision was made with a scalpel.  Local anesthetic: lidocaine 1% without epinephrine  Anesthetic total: 3  ml  Complexity: complex Blunt dissection to break up loculations  Drainage: purulent  Drainage amount: Scant  Packing material: None  Patient tolerance: Patient tolerated the procedure well with no immediate complications.  Medications Ordered in ED Medications  oxyCODONE-acetaminophen (PERCOCET/ROXICET) 5-325 MG per tablet 1 tablet (1 tablet Oral Given 03/24/17 0106)  oxyCODONE-acetaminophen (PERCOCET/ROXICET) 5-325 MG per tablet (not administered)  acetaminophen (TYLENOL) 325 MG tablet (not administered)  acetaminophen (TYLENOL) tablet 650 mg (650 mg Oral Given 03/24/17 0109)     Initial Impression / Assessment and Plan / ED Course  I have reviewed the triage vital signs and the nursing notes.  Pertinent labs & imaging results that were available during my care of the patient were reviewed by me and considered in my medical decision making (see chart for details).  Abscess left forearm with fever.  Because of fever and tachycardia, he was started on sepsis pathway.  Lactic acid level is normal.  WBC  is moderately elevated at 16.1.  Abscess is identified by ultrasound, and incised and drained in the ED and he is given a dose of vancomycin.  I&D showed very little pus.  In light of his labs and generally looking well clinically, it is elected to give him a trial of outpatient treatment.  He is discharged with prescription for doxycycline, but advised to return in 2 days for recheck.  Final Clinical Impressions(s) / ED Diagnoses   Final diagnoses:  Abscess of left forearm  Cellulitis of left forearm    New Prescriptions New Prescriptions   DOXYCYCLINE (VIBRAMYCIN) 100 MG CAPSULE    Take 1 capsule (100 mg total) by mouth 2 (two) times daily.   OXYCODONE-ACETAMINOPHEN (PERCOCET) 5-325 MG TABLET    Take 1 tablet by mouth every 4 (four) hours as needed for moderate pain.     Dione Booze, MD 03/24/17 [phone]

## 2017-03-24 NOTE — ED Notes (Signed)
Pt reports he got an abscess on his arm that started approximately at 0800 or 3- 4 days.PT woke up this morning with a fever and chlls.

## 2017-03-24 NOTE — ED Provider Notes (Addendum)
I was shown positive blood culture by nursing was called to them by pharmacy. Blood culture has gram-positive cocci identified as group A strep on ID panel.  Patient was bacteremic. Was given vancomycin last night. Was discharged on doxycycline which is poor strep coverage. I recommend patient be seen at emergency room for repeat culture, repeat antibiotics, overall evon, if considered for outpatient better coverage for group a strep.   Rolland PorterJames, Tawnya Pujol, MD 03/24/17 Serena Croissant1928    Rolland PorterJames, Zayquan Bogard, MD 03/24/17 Serena Croissant1928

## 2017-03-24 NOTE — Discharge Instructions (Signed)
Return in two days for a recheck. Return sooner if the pain and swelling are getting worse.

## 2017-03-24 NOTE — Telephone Encounter (Signed)
Was called by microbiology lab about a positive blood culture - Spoke to MD Fayrene FearingJames about the results. Per MD that patient is to return to the ER for possible further IV antibiotics and possible blood cultures. The patient was called and made aware of this at 1935 and he states understanding.

## 2017-03-24 NOTE — ED Triage Notes (Signed)
Here yesterday for wound to left forearm.  Discharged this morning.  Received call from [place] saying he needed to come in for a different antibiotic.  Last dose of ibuprofen 2 hours pta.

## 2017-03-24 NOTE — ED Triage Notes (Signed)
Reports having wound to left forearm for the last three days with body aches and fever.  Arm noted to be red and swollen.  Temp 101 in triage.

## 2017-03-25 ENCOUNTER — Inpatient Hospital Stay (HOSPITAL_COMMUNITY): Payer: Self-pay | Admitting: Anesthesiology

## 2017-03-25 ENCOUNTER — Encounter (HOSPITAL_COMMUNITY): Payer: Self-pay | Admitting: Family Medicine

## 2017-03-25 ENCOUNTER — Encounter (HOSPITAL_COMMUNITY)
Admission: EM | Disposition: A | Discharge status: Home / Self Care | Payer: Self-pay | Source: Home / Self Care | Attending: Internal Medicine

## 2017-03-25 ENCOUNTER — Inpatient Hospital Stay (HOSPITAL_COMMUNITY)
Admission: EM | Admit: 2017-03-25 | Discharge: 2017-03-27 | DRG: 854 | Disposition: A | Discharge status: Home / Self Care | Payer: Self-pay | Attending: Internal Medicine | Admitting: Internal Medicine

## 2017-03-25 DIAGNOSIS — M71022 Abscess of bursa, left elbow: Secondary | ICD-10-CM

## 2017-03-25 DIAGNOSIS — L03114 Cellulitis of left upper limb: Secondary | ICD-10-CM | POA: Diagnosis present

## 2017-03-25 DIAGNOSIS — F1910 Other psychoactive substance abuse, uncomplicated: Secondary | ICD-10-CM | POA: Diagnosis present

## 2017-03-25 DIAGNOSIS — R7881 Bacteremia: Secondary | ICD-10-CM

## 2017-03-25 DIAGNOSIS — F29 Unspecified psychosis not due to a substance or known physiological condition: Secondary | ICD-10-CM

## 2017-03-25 DIAGNOSIS — B955 Unspecified streptococcus as the cause of diseases classified elsewhere: Secondary | ICD-10-CM

## 2017-03-25 DIAGNOSIS — L02414 Cutaneous abscess of left upper limb: Secondary | ICD-10-CM

## 2017-03-25 DIAGNOSIS — F1990 Other psychoactive substance use, unspecified, uncomplicated: Secondary | ICD-10-CM

## 2017-03-25 DIAGNOSIS — E871 Hypo-osmolality and hyponatremia: Secondary | ICD-10-CM | POA: Diagnosis present

## 2017-03-25 DIAGNOSIS — F1120 Opioid dependence, uncomplicated: Secondary | ICD-10-CM | POA: Diagnosis present

## 2017-03-25 HISTORY — PX: I & D EXTREMITY: SHX5045

## 2017-03-25 LAB — CBC WITH DIFFERENTIAL/PLATELET
BASOS ABS: 0 10*3/uL (ref 0.0–0.1)
BASOS PCT: 0 %
EOS ABS: 0.1 10*3/uL (ref 0.0–0.7)
EOS PCT: 1 %
HCT: 42.3 % (ref 39.0–52.0)
HEMOGLOBIN: 14.5 g/dL (ref 13.0–17.0)
LYMPHS ABS: 3.3 10*3/uL (ref 0.7–4.0)
Lymphocytes Relative: 23 %
MCH: 30.3 pg (ref 26.0–34.0)
MCHC: 34.3 g/dL (ref 30.0–36.0)
MCV: 88.3 fL (ref 78.0–100.0)
Monocytes Absolute: 1.3 10*3/uL — ABNORMAL HIGH (ref 0.1–1.0)
Monocytes Relative: 9 %
NEUTROS PCT: 67 %
Neutro Abs: 9.5 10*3/uL — ABNORMAL HIGH (ref 1.7–7.7)
Platelets: 180 10*3/uL (ref 150–400)
RBC: 4.79 MIL/uL (ref 4.22–5.81)
RDW: 13.5 % (ref 11.5–15.5)
WBC: 14.2 10*3/uL — ABNORMAL HIGH (ref 4.0–10.5)

## 2017-03-25 LAB — BASIC METABOLIC PANEL
ANION GAP: 10 (ref 5–15)
BUN: 6 mg/dL (ref 6–20)
CALCIUM: 8.7 mg/dL — AB (ref 8.9–10.3)
CHLORIDE: 102 mmol/L (ref 101–111)
CO2: 22 mmol/L (ref 22–32)
Creatinine, Ser: 0.81 mg/dL (ref 0.61–1.24)
GFR calc non Af Amer: >60 mL/min (ref >60)
Glucose, Bld: 110 mg/dL — ABNORMAL HIGH (ref 65–99)
POTASSIUM: 3.3 mmol/L — AB (ref 3.5–5.1)
SODIUM: 134 mmol/L — AB (ref 135–145)

## 2017-03-25 LAB — MAGNESIUM: Magnesium: 1.8 mg/dL (ref 1.7–2.4)

## 2017-03-25 LAB — SURGICAL PCR SCREEN
MRSA, PCR: NEGATIVE
Staphylococcus aureus: NEGATIVE

## 2017-03-25 LAB — HIV ANTIBODY (ROUTINE TESTING W REFLEX): HIV SCREEN 4TH GENERATION: NONREACTIVE

## 2017-03-25 SURGERY — IRRIGATION AND DEBRIDEMENT EXTREMITY
Anesthesia: General | Site: Arm Lower | Laterality: Left

## 2017-03-25 MED ORDER — HYDROMORPHONE HCL 1 MG/ML IJ SOLN
INTRAMUSCULAR | Status: AC
  Administered 2017-03-25: 0.5 mg via INTRAVENOUS
  Filled 2017-03-25: qty 2

## 2017-03-25 MED ORDER — MIDAZOLAM HCL 2 MG/2ML IJ SOLN
INTRAMUSCULAR | Status: AC
  Filled 2017-03-25: qty 2

## 2017-03-25 MED ORDER — CLINDAMYCIN PHOSPHATE 900 MG/50ML IV SOLN
900.0000 mg | Freq: Three times a day (TID) | INTRAVENOUS | Status: DC
  Administered 2017-03-25 – 2017-03-26 (×3): 900 mg via INTRAVENOUS
  Filled 2017-03-25 (×5): qty 50

## 2017-03-25 MED ORDER — ONDANSETRON HCL 4 MG/2ML IJ SOLN
INTRAMUSCULAR | Status: DC | PRN
  Administered 2017-03-25: 4 mg via INTRAVENOUS

## 2017-03-25 MED ORDER — SENNOSIDES-DOCUSATE SODIUM 8.6-50 MG PO TABS: 1.0000 | ORAL_TABLET | Freq: Every evening | ORAL | Status: DC | PRN

## 2017-03-25 MED ORDER — ACETAMINOPHEN 650 MG RE SUPP: 650.0000 mg | Freq: Four times a day (QID) | RECTAL | Status: DC | PRN

## 2017-03-25 MED ORDER — QUETIAPINE FUMARATE 50 MG PO TABS
300.0000 mg | ORAL_TABLET | Freq: Every day | ORAL | Status: DC
  Administered 2017-03-25 – 2017-03-26 (×3): 300 mg via ORAL
  Filled 2017-03-25 (×2): qty 1
  Filled 2017-03-25 (×2): qty 6

## 2017-03-25 MED ORDER — FENTANYL CITRATE (PF) 250 MCG/5ML IJ SOLN
INTRAMUSCULAR | Status: AC
  Filled 2017-03-25: qty 5

## 2017-03-25 MED ORDER — SODIUM CHLORIDE 0.9 % IR SOLN
Status: DC | PRN
  Administered 2017-03-25: 1000 mL

## 2017-03-25 MED ORDER — ONDANSETRON HCL 4 MG PO TABS: 4.0000 mg | ORAL_TABLET | Freq: Four times a day (QID) | ORAL | Status: DC | PRN

## 2017-03-25 MED ORDER — HYDROMORPHONE HCL 1 MG/ML IJ SOLN
0.2500 mg | INTRAMUSCULAR | Status: DC | PRN
  Administered 2017-03-25 (×4): 0.5 mg via INTRAVENOUS

## 2017-03-25 MED ORDER — POTASSIUM CHLORIDE CRYS ER 20 MEQ PO TBCR
40.0000 meq | EXTENDED_RELEASE_TABLET | ORAL | Status: AC
  Administered 2017-03-25: 40 meq via ORAL
  Filled 2017-03-25: qty 2

## 2017-03-25 MED ORDER — CHLORHEXIDINE GLUCONATE 4 % EX LIQD
60.0000 mL | Freq: Once | CUTANEOUS | Status: DC
  Filled 2017-03-25: qty 60

## 2017-03-25 MED ORDER — DEXMEDETOMIDINE HCL 200 MCG/2ML IV SOLN
INTRAVENOUS | Status: DC | PRN
  Administered 2017-03-25: 12 ug via INTRAVENOUS
  Administered 2017-03-25 (×2): 8 ug via INTRAVENOUS

## 2017-03-25 MED ORDER — PROMETHAZINE HCL 25 MG/ML IJ SOLN
6.2500 mg | INTRAMUSCULAR | Status: DC | PRN
  Administered 2017-03-25: 12.5 mg via INTRAVENOUS

## 2017-03-25 MED ORDER — NICOTINE 14 MG/24HR TD PT24
14.0000 mg | MEDICATED_PATCH | Freq: Every day | TRANSDERMAL | Status: DC
  Administered 2017-03-25 – 2017-03-27 (×3): 14 mg via TRANSDERMAL
  Filled 2017-03-25 (×3): qty 1

## 2017-03-25 MED ORDER — VITAMIN D 1000 UNITS PO TABS
1000.0000 [IU] | ORAL_TABLET | Freq: Every day | ORAL | Status: DC
  Administered 2017-03-25 – 2017-03-27 (×3): 1000 [IU] via ORAL
  Filled 2017-03-25 (×3): qty 1

## 2017-03-25 MED ORDER — OXYCODONE-ACETAMINOPHEN 5-325 MG PO TABS
1.0000 | ORAL_TABLET | ORAL | Status: DC | PRN
  Filled 2017-03-25: qty 2

## 2017-03-25 MED ORDER — SODIUM CHLORIDE 0.9 % IV SOLN: INTRAVENOUS | Status: DC

## 2017-03-25 MED ORDER — ONDANSETRON HCL 4 MG/2ML IJ SOLN: 4.0000 mg | Freq: Four times a day (QID) | INTRAMUSCULAR | Status: DC | PRN

## 2017-03-25 MED ORDER — PENICILLIN G POTASSIUM 5000000 UNITS IJ SOLR
4.0000 10*6.[IU] | INTRAVENOUS | Status: DC
  Administered 2017-03-25 – 2017-03-27 (×13): 4 10*6.[IU] via INTRAVENOUS
  Filled 2017-03-25 (×17): qty 4

## 2017-03-25 MED ORDER — PENICILLIN G POTASSIUM 5000000 UNITS IJ SOLR
4.0000 10*6.[IU] | Freq: Four times a day (QID) | INTRAVENOUS | Status: DC
  Filled 2017-03-25 (×3): qty 4

## 2017-03-25 MED ORDER — ATORVASTATIN CALCIUM 40 MG PO TABS
40.0000 mg | ORAL_TABLET | Freq: Every day | ORAL | Status: DC
  Administered 2017-03-25 – 2017-03-26 (×2): 40 mg via ORAL
  Filled 2017-03-25 (×2): qty 1

## 2017-03-25 MED ORDER — HYDROMORPHONE HCL 1 MG/ML IJ SOLN
1.0000 mg | INTRAMUSCULAR | Status: DC | PRN
  Administered 2017-03-25 – 2017-03-26 (×5): 1 mg via INTRAVENOUS
  Filled 2017-03-25 (×6): qty 1

## 2017-03-25 MED ORDER — FENTANYL CITRATE (PF) 100 MCG/2ML IJ SOLN
INTRAMUSCULAR | Status: AC
  Filled 2017-03-25: qty 2

## 2017-03-25 MED ORDER — SODIUM CHLORIDE 0.9 % IV SOLN
INTRAVENOUS | Status: AC
  Administered 2017-03-25: 03:00:00 via INTRAVENOUS

## 2017-03-25 MED ORDER — HYDROMORPHONE HCL 2 MG PO TABS
4.0000 mg | ORAL_TABLET | Freq: Four times a day (QID) | ORAL | Status: DC | PRN
  Administered 2017-03-26 – 2017-03-27 (×2): 4 mg via ORAL
  Filled 2017-03-25 (×2): qty 2

## 2017-03-25 MED ORDER — ENOXAPARIN SODIUM 40 MG/0.4ML ~~LOC~~ SOLN: 40.0000 mg | SUBCUTANEOUS | Status: DC

## 2017-03-25 MED ORDER — PROPOFOL 10 MG/ML IV BOLUS
INTRAVENOUS | Status: AC
  Filled 2017-03-25: qty 20

## 2017-03-25 MED ORDER — FENTANYL CITRATE (PF) 100 MCG/2ML IJ SOLN
25.0000 ug | INTRAMUSCULAR | Status: DC | PRN
  Administered 2017-03-25 (×3): 50 ug via INTRAVENOUS

## 2017-03-25 MED ORDER — CLINDAMYCIN PHOSPHATE 600 MG/50ML IV SOLN
600.0000 mg | Freq: Once | INTRAVENOUS | Status: AC
  Administered 2017-03-25: 600 mg via INTRAVENOUS
  Filled 2017-03-25: qty 50

## 2017-03-25 MED ORDER — HYDROMORPHONE HCL 2 MG PO TABS: 6.0000 mg | ORAL_TABLET | Freq: Four times a day (QID) | ORAL | Status: DC | PRN

## 2017-03-25 MED ORDER — SODIUM CHLORIDE 0.9 % IV SOLN
INTRAVENOUS | Status: DC | PRN
  Administered 2017-03-25 (×2): via INTRAVENOUS

## 2017-03-25 MED ORDER — PROMETHAZINE HCL 25 MG/ML IJ SOLN
INTRAMUSCULAR | Status: AC
  Administered 2017-03-25: 12.5 mg via INTRAVENOUS
  Filled 2017-03-25: qty 1

## 2017-03-25 MED ORDER — HYDROMORPHONE HCL 1 MG/ML IJ SOLN
1.0000 mg | INTRAMUSCULAR | Status: DC | PRN
  Administered 2017-03-25: 1 mg via INTRAVENOUS

## 2017-03-25 MED ORDER — BISACODYL 5 MG PO TBEC: 5.0000 mg | DELAYED_RELEASE_TABLET | Freq: Every day | ORAL | Status: DC | PRN

## 2017-03-25 MED ORDER — PROPOFOL 10 MG/ML IV BOLUS
INTRAVENOUS | Status: DC | PRN
  Administered 2017-03-25: 200 mg via INTRAVENOUS
  Administered 2017-03-25: 20 mg via INTRAVENOUS

## 2017-03-25 MED ORDER — MIDAZOLAM HCL 5 MG/5ML IJ SOLN
INTRAMUSCULAR | Status: DC | PRN
  Administered 2017-03-25: 2 mg via INTRAVENOUS

## 2017-03-25 MED ORDER — ACETAMINOPHEN 325 MG PO TABS
650.0000 mg | ORAL_TABLET | Freq: Four times a day (QID) | ORAL | Status: DC | PRN
  Administered 2017-03-26 (×2): 650 mg via ORAL
  Filled 2017-03-25 (×2): qty 2

## 2017-03-25 MED ORDER — POVIDONE-IODINE 10 % EX SWAB: 2.0000 "application " | Freq: Once | CUTANEOUS | Status: DC

## 2017-03-25 MED ORDER — FENTANYL CITRATE (PF) 100 MCG/2ML IJ SOLN
INTRAMUSCULAR | Status: DC | PRN
  Administered 2017-03-25 (×2): 25 ug via INTRAVENOUS
  Administered 2017-03-25 (×4): 50 ug via INTRAVENOUS

## 2017-03-25 MED ORDER — NICOTINE 21 MG/24HR TD PT24
21.0000 mg | MEDICATED_PATCH | Freq: Every day | TRANSDERMAL | Status: DC
Start: 2017-03-25 — End: 2017-03-25

## 2017-03-25 MED ORDER — FENTANYL CITRATE (PF) 100 MCG/2ML IJ SOLN
INTRAMUSCULAR | Status: AC
  Administered 2017-03-25: 50 ug via INTRAVENOUS
  Filled 2017-03-25: qty 2

## 2017-03-25 MED ORDER — OXYCODONE HCL 5 MG PO TABS
5.0000 mg | ORAL_TABLET | ORAL | Status: DC | PRN
  Administered 2017-03-26 – 2017-03-27 (×5): 10 mg via ORAL
  Filled 2017-03-25 (×5): qty 2

## 2017-03-25 MED ORDER — LIDOCAINE HCL (CARDIAC) 20 MG/ML IV SOLN
INTRAVENOUS | Status: DC | PRN
  Administered 2017-03-25: 100 mg via INTRAVENOUS

## 2017-03-25 SURGICAL SUPPLY — 50 items
BANDAGE ACE 3X5.8 VEL STRL LF (GAUZE/BANDAGES/DRESSINGS) IMPLANT
BANDAGE ACE 4X5 VEL STRL LF (GAUZE/BANDAGES/DRESSINGS) ×2 IMPLANT
BLADE SURG 10 STRL SS (BLADE) ×3 IMPLANT
BNDG COHESIVE 4X5 TAN STRL (GAUZE/BANDAGES/DRESSINGS) ×3 IMPLANT
BNDG COHESIVE 6X5 TAN STRL LF (GAUZE/BANDAGES/DRESSINGS) ×2 IMPLANT
COVER SURGICAL LIGHT HANDLE (MISCELLANEOUS) ×3 IMPLANT
CUFF TOURNIQUET SINGLE 18IN (TOURNIQUET CUFF) ×3 IMPLANT
CUFF TOURNIQUET SINGLE 34IN LL (TOURNIQUET CUFF) IMPLANT
DRAPE ORTHO SPLIT 77X108 STRL (DRAPES) ×6
DRAPE SURG 17X23 STRL (DRAPES) IMPLANT
DRAPE SURG ORHT 6 SPLT 77X108 (DRAPES) ×2 IMPLANT
DRAPE U-SHAPE 47X51 STRL (DRAPES) ×5 IMPLANT
DRSG PAD ABDOMINAL 8X10 ST (GAUZE/BANDAGES/DRESSINGS) ×2 IMPLANT
DURAPREP 26ML APPLICATOR (WOUND CARE) ×3 IMPLANT
ELECT REM PT RETURN 9FT ADLT (ELECTROSURGICAL) ×3
ELECTRODE REM PT RTRN 9FT ADLT (ELECTROSURGICAL) IMPLANT
GAUZE SPONGE 4X4 12PLY STRL (GAUZE/BANDAGES/DRESSINGS) ×3 IMPLANT
GAUZE XEROFORM 1X8 LF (GAUZE/BANDAGES/DRESSINGS) ×3 IMPLANT
GLOVE BIO SURGEON STRL SZ8 (GLOVE) ×3 IMPLANT
GLOVE BIOGEL PI IND STRL 8 (GLOVE) ×2 IMPLANT
GLOVE BIOGEL PI INDICATOR 8 (GLOVE) ×4
GLOVE ORTHO TXT STRL SZ7.5 (GLOVE) ×3 IMPLANT
GOWN STRL REUS W/ TWL LRG LVL3 (GOWN DISPOSABLE) ×1 IMPLANT
GOWN STRL REUS W/ TWL XL LVL3 (GOWN DISPOSABLE) ×2 IMPLANT
GOWN STRL REUS W/TWL LRG LVL3 (GOWN DISPOSABLE) ×3
GOWN STRL REUS W/TWL XL LVL3 (GOWN DISPOSABLE) ×6
HANDPIECE INTERPULSE COAX TIP (DISPOSABLE)
KIT BASIN OR (CUSTOM PROCEDURE TRAY) ×3 IMPLANT
KIT ROOM TURNOVER OR (KITS) ×3 IMPLANT
MANIFOLD NEPTUNE II (INSTRUMENTS) ×1 IMPLANT
NS IRRIG 1000ML POUR BTL (IV SOLUTION) ×3 IMPLANT
PACK ORTHO EXTREMITY (CUSTOM PROCEDURE TRAY) ×3 IMPLANT
PAD ARMBOARD 7.5X6 YLW CONV (MISCELLANEOUS) ×6 IMPLANT
PAD CAST 4YDX4 CTTN HI CHSV (CAST SUPPLIES) IMPLANT
PADDING CAST ABS 4INX4YD NS (CAST SUPPLIES)
PADDING CAST ABS COTTON 4X4 ST (CAST SUPPLIES) ×2 IMPLANT
PADDING CAST COTTON 4X4 STRL (CAST SUPPLIES) ×3
PADDING CAST COTTON 6X4 STRL (CAST SUPPLIES) ×1 IMPLANT
SET HNDPC FAN SPRY TIP SCT (DISPOSABLE) IMPLANT
SPONGE LAP 18X18 X RAY DECT (DISPOSABLE) ×3 IMPLANT
STOCKINETTE IMPERVIOUS 9X36 MD (GAUZE/BANDAGES/DRESSINGS) ×3 IMPLANT
SUT ETHILON 2 0 FS 18 (SUTURE) ×4 IMPLANT
SUT ETHILON 3 0 PS 1 (SUTURE) IMPLANT
SWAB CULTURE ESWAB REG 1ML (MISCELLANEOUS) IMPLANT
TOWEL OR 17X24 6PK STRL BLUE (TOWEL DISPOSABLE) ×3 IMPLANT
TOWEL OR 17X26 10 PK STRL BLUE (TOWEL DISPOSABLE) ×3 IMPLANT
TUBE CONNECTING 12'X1/4 (SUCTIONS) ×1
TUBE CONNECTING 12X1/4 (SUCTIONS) ×2 IMPLANT
UNDERPAD 30X30 (UNDERPADS AND DIAPERS) ×3 IMPLANT
YANKAUER SUCT BULB TIP NO VENT (SUCTIONS) ×3 IMPLANT

## 2017-03-25 NOTE — Transfer of Care (Signed)
Immediate Anesthesia Transfer of Care Note  Patient: [name]  Procedure(s) Performed: IRRIGATION AND DEBRIDEMENT LEFT ARM ABSCESS (Left Arm Lower)  Patient Location: PACU  Anesthesia Type:General  Level of Consciousness: awake, alert  and oriented  Airway & Oxygen Therapy: Patient Spontanous Breathing  Post-op Assessment: Report given to RN and Post -op Vital signs reviewed and stable  Post vital signs: Reviewed and stable  Last Vitals:  Vitals:   03/25/17 0355 03/25/17 1529  BP: 95/62 (!) 144/96  Pulse: 82 (!) 107  Resp: (!) 21 (!) 7  Temp: 36.5 C   SpO2: 97% 98%    Last Pain:  Vitals:   03/25/17 1212  TempSrc:   PainSc: 2       Patients Stated Pain Goal: 2 (03/25/17 1212)  Complications: No apparent anesthesia complications

## 2017-03-25 NOTE — H&P (Signed)
History and Physical    [name]:[id number] DOB: 12/22/1980 DOA: 03/25/2017  PCP: Patient, No Pcp Per   Patient coming from: Home  Chief Complaint: Left arm abscess, cellulitis, called in for positive blood cultures   HPI: [name] is a 36 y.o. male with medical history significant for hyperlipidemia, psychosis, and IV drug abuse with opiate dependence, now presenting to the emergency department after he was called in for bacteremia. Patient had been seen in the ED approximately 24 hours ago for evaluation of the left forearm abscess with fevers and chills. He was noted to be febrile with a leukocytosis, but otherwise stable and was discharged home with doxycycline after he was treated in the emergency department with vancomycin and I&D. He reports taking 2 doses of doxycycline back at home before he was called in by the ED personnel due to his blood cultures from yesterday becoming positive for group A strep. Continues to have significant erythema around the abscess site in his left forearm, reports continued intermittent fevers, but reports resolution of body aches and feeling better overall than prior to the I&D. No other complaints.   ED Course: Upon arrival to the ED, patient is found to be afebrile, saturating well on room air, tachycardic in the 110s, and with stable blood pressure. Chemistry panel is notable for sodium of 132 and CBC features a leukocytosis to 15,400. Lactic acid is reassuring at 0.90. Blood cultures were repeated in the emergency department and the patient was treated with clindamycin and penicillin G. He remained hemodynamically stable in the ED, has not been in any apparent respiratory distress, and will be admitted to the medical/surgical unit for ongoing evaluation and management of IV drug abuse with left forearm abscess and bacteremia.  Review of Systems:  All other systems reviewed and apart from HPI, are negative.  Past Medical History:  Diagnosis  Date  . Abscess   . GERD (gastroesophageal reflux disease)   . Heroin addiction (HCC)     History reviewed. No pertinent surgical history.   reports that he has been smoking Cigarettes.  He has been smoking about 2.00 packs per day for the past 0.00 years. He has never used smokeless tobacco. He reports that he uses drugs, including Other-see comments, Cocaine, Marijuana, and Heroin. He reports that he does not drink alcohol.  No Known Allergies  Family History  Problem Relation Age of Onset  . Anxiety disorder Mother   . Mental illness Neg Hx   . Hypertension Neg Hx      Prior to Admission medications   Medication Sig Start Date End Date Taking? Authorizing Provider  atorvastatin (LIPITOR) 40 MG tablet Take 40 mg by mouth daily.   Yes [provider]  cholecalciferol (VITAMIN D) 1000 units tablet Take 1,000 Units by mouth daily.   Yes [provider]  doxycycline (VIBRAMYCIN) 100 MG capsule Take 1 capsule (100 mg total) by mouth 2 (two) times daily. 03/24/17  Yes Dione Booze, MD  QUEtiapine (SEROQUEL) 300 MG tablet Take 300 mg by mouth at bedtime.   Yes [provider]  oxyCODONE-acetaminophen (PERCOCET) 5-325 MG tablet Take 1 tablet by mouth every 4 (four) hours as needed for moderate pain. 03/24/17   Dione Booze, MD    Physical Exam: Vitals:   03/24/17 2111 03/24/17 2329 03/25/17 0126  BP: (!) 142/67 99/70 100/68  Pulse: (!) 119 97 87  Resp: 18 16 15   Temp: 99 F (37.2 C) 97.9 F (36.6 C)  TempSrc: Oral Oral   SpO2: 94% 95% 98%  Weight: 108.9 kg (240 lb)    Height: 5\' 11"  (1.803 m)        Constitutional: NAD, calm, appears uncomfortable Eyes: PERTLA, lids and conjunctivae normal ENMT: Mucous membranes are moist. Posterior pharynx clear of any exudate or lesions.   Neck: normal, supple, no masses, no thyromegaly Respiratory: clear to auscultation bilaterally, no wheezing, no crackles. Normal respiratory effort.   Cardiovascular: S1 &  S2 heard, regular rate and rhythm. No extremity edema. No significant JVD. Abdomen: No distension, no tenderness, no masses palpated. Bowel sounds normal.  Musculoskeletal: no clubbing / cyanosis. No joint deformity upper and lower extremities.   Skin: Erythema, tenderness, and edema centered at anterior left arm, just distal to [facility]. Otherwise warm, dry, well-perfused. Neurologic: CN 2-12 grossly intact. Sensation intact. Strength 5/5 in all 4 limbs.  Psychiatric: Alert and oriented x 3. Calm, cooperative.     Labs on Admission: I have personally reviewed following labs and imaging studies  CBC:  Recent Labs Lab 03/24/17 0122 03/24/17 2229  WBC 16.1* 15.4*  NEUTROABS  --  11.2*  HGB 15.7 14.4  HCT 44.5 42.4  MCV 88.5 88.5  PLT 204 188   Basic Metabolic Panel:  Recent Labs Lab 03/24/17 0122 03/24/17 2137  NA 134* 132*  K 3.5 3.7  CL 98* 99*  CO2 24 22  GLUCOSE 133* 126*  BUN 6 7  CREATININE 0.88 0.86  CALCIUM 8.8* 8.7*   GFR: Estimated Creatinine Clearance: 149 mL/min (by C-G formula based on SCr of 0.86 mg/dL). Liver Function Tests:  Recent Labs Lab 03/24/17 2137  AST 26  ALT 25  ALKPHOS 85  BILITOT 0.9  PROT 8.4*  ALBUMIN 3.5   No results for input(s): LIPASE, AMYLASE in the last 168 hours. No results for input(s): AMMONIA in the last 168 hours. Coagulation Profile:  Recent Labs Lab 03/24/17 0122  INR 1.00   Cardiac Enzymes: No results for input(s): CKTOTAL, CKMB, CKMBINDEX, TROPONINI in the last 168 hours. BNP (last 3 results) No results for input(s): PROBNP in the last 8760 hours. HbA1C: No results for input(s): HGBA1C in the last 72 hours. CBG: No results for input(s): GLUCAP in the last 168 hours. Lipid Profile: No results for input(s): CHOL, HDL, LDLCALC, TRIG, CHOLHDL, LDLDIRECT in the last 72 hours. Thyroid Function Tests: No results for input(s): TSH, T4TOTAL, FREET4, T3FREE, THYROIDAB in the last 72 hours. Anemia Panel: No results  for input(s): VITAMINB12, FOLATE, FERRITIN, TIBC, IRON, RETICCTPCT in the last 72 hours. Urine analysis:    Component Value Date/Time   COLORURINE YELLOW 03/24/2017 0131   APPEARANCEUR CLEAR 03/24/2017 0131   LABSPEC 1.018 03/24/2017 0131   PHURINE 6.0 03/24/2017 0131   GLUCOSEU NEGATIVE 03/24/2017 0131   HGBUR NEGATIVE 03/24/2017 0131   BILIRUBINUR NEGATIVE 03/24/2017 0131   KETONESUR NEGATIVE 03/24/2017 0131   PROTEINUR NEGATIVE 03/24/2017 0131   UROBILINOGEN 1.0 03/15/2015 2220   NITRITE NEGATIVE 03/24/2017 0131   LEUKOCYTESUR NEGATIVE 03/24/2017 0131   Sepsis Labs: @LABRCNTIP (procalcitonin:4,lacticidven:4) ) Recent Results (from the past 240 hour(s))  Culture, blood (Routine x 2)     Status: None (Preliminary result)   Collection Time: 03/24/17  1:24 AM  Result Value Ref Range Status   Specimen Description BLOOD RIGHT HAND  Final   Special Requests   Final    BOTTLES DRAWN AEROBIC AND ANAEROBIC Blood Culture adequate volume   Culture  Setup Time   Final  GRAM POSITIVE COCCI IN CHAINS IN BOTH AEROBIC AND ANAEROBIC BOTTLES CRITICAL RESULT CALLED TO, READ BACK BY AND VERIFIED WITH: K CONNOR,RN AT 1859 03/24/17 BY L BENFIELD    Culture GRAM POSITIVE COCCI  Final   Report Status PENDING  Incomplete  Blood Culture ID Panel (Reflexed)     Status: Abnormal   Collection Time: 03/24/17  1:24 AM  Result Value Ref Range Status   Enterococcus species NOT DETECTED NOT DETECTED Final   Listeria monocytogenes NOT DETECTED NOT DETECTED Final   Staphylococcus species NOT DETECTED NOT DETECTED Final   Staphylococcus aureus NOT DETECTED NOT DETECTED Final   Streptococcus species DETECTED (A) NOT DETECTED Final    Comment: CRITICAL RESULT CALLED TO, READ BACK BY AND VERIFIED WITH: K CONNOR,RN AT 1859 03/24/17 BY L BENFIELD    Streptococcus agalactiae NOT DETECTED NOT DETECTED Final   Streptococcus pneumoniae NOT DETECTED NOT DETECTED Final   Streptococcus pyogenes DETECTED (A) NOT  DETECTED Final    Comment: CRITICAL RESULT CALLED TO, READ BACK BY AND VERIFIED WITH: K CONNOR,RN AT 1859 03/24/17 BY L BENFIELD    Acinetobacter baumannii NOT DETECTED NOT DETECTED Final   Enterobacteriaceae species NOT DETECTED NOT DETECTED Final   Enterobacter cloacae complex NOT DETECTED NOT DETECTED Final   Escherichia coli NOT DETECTED NOT DETECTED Final   Klebsiella oxytoca NOT DETECTED NOT DETECTED Final   Klebsiella pneumoniae NOT DETECTED NOT DETECTED Final   Proteus species NOT DETECTED NOT DETECTED Final   Serratia marcescens NOT DETECTED NOT DETECTED Final   Haemophilus influenzae NOT DETECTED NOT DETECTED Final   Neisseria meningitidis NOT DETECTED NOT DETECTED Final   Pseudomonas aeruginosa NOT DETECTED NOT DETECTED Final   Candida albicans NOT DETECTED NOT DETECTED Final   Candida glabrata NOT DETECTED NOT DETECTED Final   Candida krusei NOT DETECTED NOT DETECTED Final   Candida parapsilosis NOT DETECTED NOT DETECTED Final   Candida tropicalis NOT DETECTED NOT DETECTED Final     Radiological Exams on Admission: Dg Chest 2 View  Result Date: 03/24/2017 CLINICAL DATA:  36 y/o M; body aches and fever. Sepsis protocol. Left forearm wound. EXAM: CHEST  2 VIEW COMPARISON:  03/16/2015 chest radiograph FINDINGS: Stable normal cardiac silhouette. Bones are unremarkable. No pleural effusion or pneumothorax. Bronchitic changes greatest in the lung bases. No focal consolidation. IMPRESSION: Bronchitic changes greatest in the lung bases. No focal consolidation. Electronically Signed   By: Mitzi HansenLance  Furusawa-Stratton M.D.   On: 03/24/2017 01:45    EKG: Not performed.   Assessment/Plan  1. Cellulitis, bacteremia  - Pt developed [facility], underwent I&D and received vanc in ED early am of 10/20, now returns with persistent fevers at home and blood culture with GAS  - He is afebrile here, hemodynamically stable, and with reassuring lactate  - Blood  cultures repeated in ED and he was treated with penicillin G and clindamycin  - Continue penicillin G and clindamycin pending sensitivities     2. IV drug abuse, opiate-dependence  - Bacteremic as above  - No murmur appreciated  - Check HIV and viral hepatitis   3. Hyponatremia  - Serum sodium is 132 on admission in setting of hypovolemia  - Started on NS infusion and chem panel will be repeated in am   4. Psychosis  - Calm and cooperative on admission and denies hallucinations, SI, or HI  - Continue Seroquel qHS    DVT prophylaxis: Lovenox Code Status: Full  Family Communication:  Discussed with patient Disposition Plan: Admit to med-surg Consults called: None Admission status: Inpatient    Briscoe Deutscher, MD [facility] Pager [phone]  If 7PM-7AM, please contact night-coverage [email or web address] Password [email or web address]  03/25/2017, 2:02 AM

## 2017-03-25 NOTE — Anesthesia Procedure Notes (Signed)
Procedure Name: LMA Insertion Date/Time: 03/25/2017 2:42 PM Performed by: Dairl PonderJIANG, Bliss Behnke Pre-anesthesia Checklist: Patient identified, Emergency Drugs available, Suction available, Patient being monitored and Timeout performed Patient Re-evaluated:Patient Re-evaluated prior to induction Oxygen Delivery Method: Circle system utilized Induction Type: IV induction LMA: LMA inserted LMA Size: 5.0 Number of attempts: 1 Placement Confirmation: positive ETCO2 and breath sounds checked- equal and bilateral Tube secured with: Tape Dental Injury: Teeth and Oropharynx as per pre-operative assessment

## 2017-03-25 NOTE — Op Note (Signed)
NAMSimonne Mays:  [place], [place]                ACCOUNT NO.:  [id number]  MEDICAL RECORD NO.:  [id number]  LOCATION:  [facility]                         FACILITY:  [facility]  PHYSICIAN:  Vanita PandaChristopher Y. Magnus IvanBlackman, M.D.DATE OF BIRTH:  04-11-1981  DATE OF PROCEDURE:  03/25/2017 DATE OF DISCHARGE:                              OPERATIVE REPORT   PREOPERATIVE DIAGNOSIS:  Suspected left arm antecubital fossa abscess from intravenous drug abuse.  POSTOPERATIVE DIAGNOSIS:  Left arm cellulitis and antecubital edema from history of intravenous drug abuse.  FINDINGS:  Edematous tissue in the antecubital fossa and necrotic fascia from IV drug abuse.  No abscess and countered.  PROCEDURE:  Irrigation and debridement of left arm antecubital fossa tissue with sharp excisional debridement of minimal necrotic adipose tissue and fascia.  SURGEON:  Vanita PandaChristopher Y. Magnus IvanBlackman, M.D.  ANESTHESIA:  General.  BLOOD LOSS:  Less than 50 mL.  TOURNIQUET TIME:  5 minutes.  COMPLICATIONS:  None.  INDICATIONS:  Mr. [name] is a 36 year old, admitted IV drug abuser, who about 5 days ago had injected his left arm in the antecubital fossa area.  He then presented to the emergency room about a day or 2 ago with redness in this area and they sent him on oral antibiotics.  He then returned late last night with worsening cellulitis.  He was admitted to the Medicine Service.  The ER physician's assessed this under ultrasound and tried to perform an aspiration of this area, but were unsuccessful. With worsening cellulitis and pain today, Orthopedic Surgery was consulted.  Upon assessment, I found firm tissue in the antecubital area with associated cellulitis.  It was hard to tell if there was induration consistent with an abscess and he had some pain in this area, but we felt that he was on the verge of worsening infection, so we recommended open irrigation and debridement.  He understood this well and did wish to proceed with  surgery.  PROCEDURE DESCRIPTION:  After informed consent was obtained, appropriate left arm was marked, he was brought to the operating room, placed supine on the operating table.  General anesthesia was then obtained.  A nonsterile tourniquet was placed around his upper left arm and his left arm antecubital area and forearm down the wrist were prepped and draped with DuraPrep and sterile drapes including sterile stockinette.  A time- out was called and he was identified as correct patient and correct left arm.  I then elevated the arm and had the tourniquet inflated to 250 mmHg.  I then made a curvilinear incision over the area where he injected and dissected down through the soft tissue and found no abscess collection, but I found definitely fasciitis and myositis in this area and a significant edema with a likelihood of him developing an abscess. I was able to sharply excise some necrotic fascia and adipose tissue in this area as well as ellipsed out the area of skin where he had injected.  I then irrigated the soft tissue with several liters of normal saline solution using a bulb syringe.  I then loosely reapproximated the skin with interrupted 2-0 nylon suture.  Xeroform and well-padded sterile dressing were applied.  Of note, the tourniquet  was let down before I closed and hemostasis was obtained with electrocautery.  Once sterile dressing was applied, he was awakened, extubated, and taken to recovery room in stable condition.  All final counts were correct.  There were no complications noted.     Vanita Panda. Magnus Ivan, M.D.     CYB/MEDQ  D:  03/25/2017  T:  03/25/2017  Job:  [id number]

## 2017-03-25 NOTE — Anesthesia Postprocedure Evaluation (Signed)
Anesthesia Post Note  Patient: [name]  Procedure(s) Performed: IRRIGATION AND DEBRIDEMENT LEFT ARM ABSCESS (Left Arm Lower)     Patient location during evaluation: PACU Anesthesia Type: General Level of consciousness: awake and alert Pain management: pain level controlled Vital Signs Assessment: post-procedure vital signs reviewed and stable Respiratory status: spontaneous breathing, nonlabored ventilation and respiratory function stable Cardiovascular status: blood pressure returned to baseline and stable Postop Assessment: no apparent nausea or vomiting Anesthetic complications: no    Last Vitals:  Vitals:   03/25/17 1630 03/25/17 1643  BP: 125/80 131/81  Pulse: (!) 104 (!) 103  Resp: 12 15  Temp:  36.7 C  SpO2: 97% 95%    Last Pain:  Vitals:   03/25/17 1625  TempSrc:   PainSc: 7                  Beryle Lathehomas E Torez Beauregard

## 2017-03-25 NOTE — Brief Op Note (Signed)
03/25/2017  3:23 PM  PATIENT:  [name]  36 y.o. male  PRE-OPERATIVE DIAGNOSIS:  infected left arm abscess  POST-OPERATIVE DIAGNOSIS:  infected left arm abscess  PROCEDURE:  Procedure(s): IRRIGATION AND DEBRIDEMENT LEFT ARM ABSCESS (Left)  SURGEON:  Surgeon(s) and Role:    Kathryne Hitch* Yazmina Pareja Y, MD - Primary  ANESTHESIA:   general  EBL:  minimal  COUNTS:  YES  TOURNIQUET:   Total Tourniquet Time Documented: Upper Arm (laterality) - 5 minutes Total: Upper Arm (laterality) - 5 minutes   DICTATION: .Other Dictation: Dictation Number [id number]  PLAN OF CARE: Admit to inpatient   PATIENT DISPOSITION:  PACU - hemodynamically stable.   Delay start of Pharmacological VTE agent (>24hrs) due to surgical blood loss or risk of bleeding: no

## 2017-03-25 NOTE — Progress Notes (Signed)
[facility]  [name] is a 36 y.o. male with medical history significant for hyperlipidemia, psychosis, and IV drug abuse with opiate dependence, now presenting to the emergency department after he was called in for bacteremia. Patient had been seen in the ED approximately 24 hours ago for evaluation of the left forearm abscess with fevers and chills. He was noted to be febrile with a leukocytosis, but otherwise stable and was discharged home with doxycycline after he was treated in the emergency department with vancomycin and I&D. He reports taking 2 doses of doxycycline back at home before he was called in by the ED personnel due to his blood cultures from yesterday becoming positive for group A strep. Continues to have significant erythema around the abscess site in his left forearm, reports continued intermittent fevers, but reports resolution of body aches and feeling better overall than prior to the I&D.  Subjective: No longer feeling feverish. Left arm hurts quite a bit.  Principal Problem: Bacteremia- strep pyogenes Left arm abscess IV drug abuse Leukocytosis, tachycardia>> Sepsis - cont Pen G and Clindamycin and await sensitivities - have ask surgery to evaluate for need for I and D- patient to go to OR for surgery - cont narcotics for pain control- hopefully he will need less after I and D  Active Problems:   HLD - Lipitor  Hyponatremia - cont slow IVF  Hypokalemia - Replace- Mg is normal  Nicotine abuse - Nicotine patch ordered- smoking Cessation discussed  Calvert CantorSaima Casara Perrier, MD

## 2017-03-25 NOTE — Consult Note (Signed)
Reason for Consult:  Left arm infection Referring Physician: [facility]  [name] is an 36 y.o. male.  HPI:   36 yo male who has been in the ED several times the last few days due to worsening left arm pain and redness after IV drug abuse inserting a needle in the anti-cubital area of his left arm.  Was sent out from the ED on oral antibiotics, but then returned last evening.  An attempt at an I&D by the ED staff after ultrasound of his left arm was unsuccessful and the patient was admitted to start IV antibiotics.  Ortho is consulted today to assess for a possible surgical intervention.  The patient does report left arm pain in the area where he injected drugs.  He denies left hand pain and denies numbness and tingling.  Past Medical History:  Diagnosis Date  . Abscess   . GERD (gastroesophageal reflux disease)   . Heroin addiction (HCC)     History reviewed. No pertinent surgical history.  Family History  Problem Relation Age of Onset  . Anxiety disorder Mother   . Mental illness Neg Hx   . Hypertension Neg Hx     Social History:  reports that he has been smoking Cigarettes.  He has been smoking about 2.00 packs per day for the past 0.00 years. He has never used smokeless tobacco. He reports that he uses drugs, including Other-see comments, Cocaine, Marijuana, and Heroin. He reports that he does not drink alcohol.  Allergies: No Known Allergies  Medications: I have reviewed the patient's current medications.  Results for orders placed or performed during the hospital encounter of 03/25/17 (from the past 48 hour(s))  Comprehensive metabolic panel     Status: Abnormal   Collection Time: 03/24/17  9:37 PM  Result Value Ref Range   Sodium 132 (L) 135 - 145 mmol/L   Potassium 3.7 3.5 - 5.1 mmol/L   Chloride 99 (L) 101 - 111 mmol/L   CO2 22 22 - 32 mmol/L   Glucose, Bld 126 (H) 65 - 99 mg/dL   BUN 7 6 - 20 mg/dL   Creatinine, Ser 6.21 0.61 - 1.24 mg/dL   Calcium 8.7  (L) 8.9 - 10.3 mg/dL   Total Protein 8.4 (H) 6.5 - 8.1 g/dL   Albumin 3.5 3.5 - 5.0 g/dL   AST 26 15 - 41 U/L   ALT 25 17 - 63 U/L   Alkaline Phosphatase 85 38 - 126 U/L   Total Bilirubin 0.9 0.3 - 1.2 mg/dL   GFR calc non Af Amer >60 >60 mL/min   GFR calc Af Amer >60 >60 mL/min    Comment: (NOTE) The eGFR has been calculated using the CKD EPI equation. This calculation has not been validated in all clinical situations. eGFR's persistently <60 mL/min signify possible Chronic Kidney Disease.    Anion gap 11 5 - 15  I-Stat CG4 Lactic Acid, ED     Status: None   Collection Time: 03/24/17  9:53 PM  Result Value Ref Range   Lactic Acid, Venous 0.90 0.5 - 1.9 mmol/L  CBC with Differential/Platelet     Status: Abnormal   Collection Time: 03/24/17 10:29 PM  Result Value Ref Range   WBC 15.4 (H) 4.0 - 10.5 K/uL   RBC 4.79 4.22 - 5.81 MIL/uL   Hemoglobin 14.4 13.0 - 17.0 g/dL   HCT 09.0 22.7 - 73.3 %   MCV 88.5 78.0 - 100.0 fL   MCH 30.1  26.0 - 34.0 pg   MCHC 34.0 30.0 - 36.0 g/dL   RDW 13.5 11.5 - 15.5 %   Platelets 188 150 - 400 K/uL   Neutrophils Relative % 73 %   Neutro Abs 11.2 (H) 1.7 - 7.7 K/uL   Lymphocytes Relative 20 %   Lymphs Abs 3.0 0.7 - 4.0 K/uL   Monocytes Relative 7 %   Monocytes Absolute 1.1 (H) 0.1 - 1.0 K/uL   Eosinophils Relative 0 %   Eosinophils Absolute 0.1 0.0 - 0.7 K/uL   Basophils Relative 0 %   Basophils Absolute 0.0 0.0 - 0.1 K/uL  CBC WITH DIFFERENTIAL     Status: Abnormal   Collection Time: 03/25/17  2:13 AM  Result Value Ref Range   WBC 14.2 (H) 4.0 - 10.5 K/uL   RBC 4.79 4.22 - 5.81 MIL/uL   Hemoglobin 14.5 13.0 - 17.0 g/dL   HCT 42.3 39.0 - 52.0 %   MCV 88.3 78.0 - 100.0 fL   MCH 30.3 26.0 - 34.0 pg   MCHC 34.3 30.0 - 36.0 g/dL   RDW 13.5 11.5 - 15.5 %   Platelets 180 150 - 400 K/uL   Neutrophils Relative % 67 %   Neutro Abs 9.5 (H) 1.7 - 7.7 K/uL   Lymphocytes Relative 23 %   Lymphs Abs 3.3 0.7 - 4.0 K/uL   Monocytes Relative 9 %    Monocytes Absolute 1.3 (H) 0.1 - 1.0 K/uL   Eosinophils Relative 1 %   Eosinophils Absolute 0.1 0.0 - 0.7 K/uL   Basophils Relative 0 %   Basophils Absolute 0.0 0.0 - 0.1 K/uL  Basic metabolic panel     Status: Abnormal   Collection Time: 03/25/17  2:13 AM  Result Value Ref Range   Sodium 134 (L) 135 - 145 mmol/L   Potassium 3.3 (L) 3.5 - 5.1 mmol/L   Chloride 102 101 - 111 mmol/L   CO2 22 22 - 32 mmol/L   Glucose, Bld 110 (H) 65 - 99 mg/dL   BUN 6 6 - 20 mg/dL   Creatinine, Ser 0.81 0.61 - 1.24 mg/dL   Calcium 8.7 (L) 8.9 - 10.3 mg/dL   GFR calc non Af Amer >60 >60 mL/min   GFR calc Af Amer >60 >60 mL/min    Comment: (NOTE) The eGFR has been calculated using the CKD EPI equation. This calculation has not been validated in all clinical situations. eGFR's persistently <60 mL/min signify possible Chronic Kidney Disease.    Anion gap 10 5 - 15  Magnesium     Status: None   Collection Time: 03/25/17  8:30 AM  Result Value Ref Range   Magnesium 1.8 1.7 - 2.4 mg/dL    Dg Chest 2 View  Result Date: 03/24/2017 CLINICAL DATA:  36 y/o M; body aches and fever. Sepsis protocol. Left forearm wound. EXAM: CHEST  2 VIEW COMPARISON:  03/16/2015 chest radiograph FINDINGS: Stable normal cardiac silhouette. Bones are unremarkable. No pleural effusion or pneumothorax. Bronchitic changes greatest in the lung bases. No focal consolidation. IMPRESSION: Bronchitic changes greatest in the lung bases. No focal consolidation. Electronically Signed   By: Kristine Garbe M.D.   On: 03/24/2017 01:45    Review of Systems  All other systems reviewed and are negative.  Blood pressure 95/62, pulse 82, temperature 97.7 F (36.5 C), temperature source Oral, resp. rate (!) 21, height '5\' 11"'$  (1.803 m), weight 239 lb 15.9 oz (108.9 kg), SpO2 97 %. Physical Exam  Constitutional:  He is oriented to person, place, and time. He appears well-developed and well-nourished.  HENT:  Head: Normocephalic and  atraumatic.  Eyes: Pupils are equal, round, and reactive to light. EOM are normal.  Neck: Normal range of motion. Neck supple.  Cardiovascular: Normal rate.   Respiratory: Effort normal.  GI: Soft. Bowel sounds are normal.  Musculoskeletal:       Arms: Neurological: He is alert and oriented to person, place, and time.  Skin: Skin is warm and dry.  Psychiatric: He has a normal mood and affect.    Assessment/Plan: Left arm anti-cubital fossa abscess from IV drug abuse and associated cellulitis  1)  I spoke with the patient in length about the need for an irrigation/debridement of his left arm today given his infection.  The risks and benefits were explained in detail and he does understand the recommendation for surgery.  He will be made NPO now with surgery for later today.  Mcarthur Rossetti 03/25/2017, 10:55 AM

## 2017-03-25 NOTE — Anesthesia Preprocedure Evaluation (Addendum)
Anesthesia Evaluation  Patient identified by MRN, date of birth, ID band Patient awake    Reviewed: Allergy & Precautions, NPO status , Patient's Chart, lab work & pertinent test results  Airway Mallampati: III  TM Distance: >3 FB Neck ROM: Full    Dental  (+) Dental Advisory Given, Poor Dentition, Missing, Chipped   Pulmonary Current Smoker,    Pulmonary exam normal breath sounds clear to auscultation       Cardiovascular Exercise Tolerance: Good negative cardio ROS Normal cardiovascular exam Rhythm:Regular Rate:Normal     Neuro/Psych Hx psychosesnegative neurological ROS     GI/Hepatic GERD  Controlled,(+)     substance abuse ([place])  cocaine use, marijuana use and IV drug use,   Endo/Other  Obesity  Renal/GU negative Renal ROS  negative genitourinary   Musculoskeletal negative musculoskeletal ROS (+)   Abdominal   Peds  Hematology negative hematology ROS (+)   Anesthesia Other Findings   Reproductive/Obstetrics                           Anesthesia Physical Anesthesia Plan  ASA: II  Anesthesia Plan: General   Post-op Pain Management:    Induction: Intravenous  PONV Risk Score and Plan: 2 and Ondansetron, Dexamethasone and Treatment may vary due to age or medical condition  Airway Management Planned: LMA  Additional Equipment: None  Intra-op Plan:   Post-operative Plan: Extubation in OR  Informed Consent: I have reviewed the patients History and Physical, chart, labs and discussed the procedure including the risks, benefits and alternatives for the proposed anesthesia with the patient or authorized representative who has indicated his/her understanding and acceptance.   Dental advisory given  Plan Discussed with: CRNA  Anesthesia Plan Comments:         Anesthesia Quick Evaluation

## 2017-03-25 NOTE — ED Provider Notes (Signed)
[facility] EMERGENCY DEPARTMENT Provider Note   CSN: [id number] Arrival date & time: 03/24/17  2101  Time seen 01:15 AM    History   Chief Complaint Chief Complaint  Patient presents with  . Wound Check    HPI [name] is a 36 y.o. male.  HPI patient reports he has a history of abscesses in the past.  He states about 4-5 days ago he started getting abscess in his left forearm.  He states he had fevers up to 101 and has been having chills and flulike symptoms.  He states he felt very bad earlier today and has been sweating a lot.  He was seen in the ED yesterday and had a I&D done in his left antecubital area with minimal purulent drainage.  Blood cultures were done and he had positive blood cultures today for Streptococcus pyogenes.  He states he has taken 2 doses of the doxycycline he was prescribed yesterday.  He states he did have nausea and vomited once but that is gone now.  Patient does admit to IV drug use and states the last time he used heroin was 8-9 days ago.  He states he was on methadone but he is been off it 1-2 months and he has been using a friend's Suboxone.  He states he is going to start getting Suboxone prescribed to him from the [facility].  PCP none Goes to [facility]   Past Medical History:  Diagnosis Date  . Abscess   . GERD (gastroesophageal reflux disease)   . Heroin addiction [place])     Patient Active Problem List   Diagnosis Date Noted  . Cellulitis of left arm 03/25/2017  . Bacteremia 03/25/2017  . IV drug abuse (HCC) 03/25/2017  . Hyperprolactinemia (HCC) 03/19/2015  . 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 Opioid use disorder, moderate, on maintenance therapy (HCC) 03/17/2015  . Cannabis use disorder, moderate, in sustained remission (HCC) 03/17/2015  . Psoriasis 03/17/2015  . Aggressive behavior of adult   . Opiate dependence, continuous (HCC)   .  Psychoses (HCC)   . Polysubstance abuse (HCC) 04/28/2014    History reviewed. No pertinent surgical history.     Home Medications    Prior to Admission medications   Medication Sig Start Date End Date Taking? Authorizing Provider  atorvastatin (LIPITOR) 40 MG tablet Take 40 mg by mouth daily.   Yes [provider]  cholecalciferol (VITAMIN D) 1000 units tablet Take 1,000 Units by mouth daily.   Yes [provider]  doxycycline (VIBRAMYCIN) 100 MG capsule Take 1 capsule (100 mg total) by mouth 2 (two) times daily. 03/24/17  Yes Dione Booze, MD  QUEtiapine (SEROQUEL) 300 MG tablet Take 300 mg by mouth at bedtime.   Yes [provider]  oxyCODONE-acetaminophen (PERCOCET) 5-325 MG tablet Take 1 tablet by mouth every 4 (four) hours as needed for moderate pain. 03/24/17   Dione Booze, MD    Family History Family History  Problem Relation Age of Onset  . Anxiety disorder Mother   . Mental illness Neg Hx   . Hypertension Neg Hx     Social History Social History  Substance Use Topics  . Smoking status: Current Every Day Smoker    Packs/day: 2.00    Years: 0.00    Types: Cigarettes  . Smokeless tobacco: Never Used  . Alcohol use No     Comment: former  has not drank in 2  years   employed [employer] heroin   Allergies   Patient has no known allergies.   Review of Systems Review of Systems  All other systems reviewed and are negative.    Physical Exam Updated Vital Signs BP 100/68 (BP Location: Left Arm)   Pulse 87   Temp 97.9 F (36.6 C) (Oral)   Resp 15   Ht 5\' 11"  (1.803 m)   Wt 108.9 kg (240 lb)   SpO2 98%   BMI 33.47 kg/m   Physical Exam  Constitutional: He is oriented to person, place, and time. He appears well-developed and well-nourished.  Non-toxic appearance. He does not appear ill. No distress.  HENT:  Head: Normocephalic and atraumatic.  Right Ear: External ear normal.  Left Ear: External ear normal.  Nose: Nose normal. No  mucosal edema or rhinorrhea.  Mouth/Throat: Oropharynx is clear and moist and mucous membranes are normal. No dental abscesses or uvula swelling.  Eyes: Pupils are equal, round, and reactive to light. Conjunctivae and EOM are normal.  Neck: Normal range of motion and full passive range of motion without pain. Neck supple.  Cardiovascular: Normal rate, regular rhythm and normal heart sounds.  Exam reveals no gallop and no friction rub.   No murmur heard. No obvious murmur heard  Pulmonary/Chest: Effort normal and breath sounds normal. No respiratory distress. He has no wheezes. He has no rhonchi. He has no rales. He exhibits no tenderness and no crepitus.  Abdominal: Soft. Normal appearance and bowel sounds are normal. He exhibits no distension. There is no tenderness. There is no rebound and no guarding.  Musculoskeletal: Normal range of motion. He exhibits no edema or tenderness.  Moves all extremities well.   Neurological: He is alert and oriented to person, place, and time. He has normal strength. No cranial nerve deficit.  Skin: Skin is warm, dry and intact. No rash noted. There is erythema. No pallor.  Patient has redness in his left antecubital area and proximal forearm with swelling.  The incision made last night is visible with some dried blood in it but no current drainage.  Patient is noted to have a lot of bruising on his right forearm from prior IV attempts.  Psychiatric: He has a normal mood and affect. His speech is normal and behavior is normal. His mood appears not anxious.  Nursing note and vitals reviewed.      ED Treatments / Results  Labs (all labs ordered are listed, but only abnormal results are displayed) Results for orders placed or performed during the hospital encounter of 03/25/17  Comprehensive metabolic panel  Result Value Ref Range   Sodium 132 (L) 135 - 145 mmol/L   Potassium 3.7 3.5 - 5.1 mmol/L   Chloride 99 (L) 101 - 111 mmol/L   CO2 22 22 - 32 mmol/L     Glucose, Bld 126 (H) 65 - 99 mg/dL   BUN 7 6 - 20 mg/dL   Creatinine, Ser 0.450.86 0.61 - 1.24 mg/dL   Calcium 8.7 (L) 8.9 - 10.3 mg/dL   Total Protein 8.4 (H) 6.5 - 8.1 g/dL   Albumin 3.5 3.5 - 5.0 g/dL   AST 26 15 - 41 U/L   ALT 25 17 - 63 U/L   Alkaline Phosphatase 85 38 - 126 U/L   Total Bilirubin 0.9 0.3 - 1.2 mg/dL   GFR calc non Af Amer >60 >60 mL/min   GFR calc Af Amer >60 >60 mL/min   Anion gap 11 5 -  15  CBC with Differential/Platelet  Result Value Ref Range   WBC 15.4 (H) 4.0 - 10.5 K/uL   RBC 4.79 4.22 - 5.81 MIL/uL   Hemoglobin 14.4 13.0 - 17.0 g/dL   HCT 16.1 09.6 - 04.5 %   MCV 88.5 78.0 - 100.0 fL   MCH 30.1 26.0 - 34.0 pg   MCHC 34.0 30.0 - 36.0 g/dL   RDW 40.9 81.1 - 91.4 %   Platelets 188 150 - 400 K/uL   Neutrophils Relative % 73 %   Neutro Abs 11.2 (H) 1.7 - 7.7 K/uL   Lymphocytes Relative 20 %   Lymphs Abs 3.0 0.7 - 4.0 K/uL   Monocytes Relative 7 %   Monocytes Absolute 1.1 (H) 0.1 - 1.0 K/uL   Eosinophils Relative 0 %   Eosinophils Absolute 0.1 0.0 - 0.7 K/uL   Basophils Relative 0 %   Basophils Absolute 0.0 0.0 - 0.1 K/uL  I-Stat CG4 Lactic Acid, ED  Result Value Ref Range   Lactic Acid, Venous 0.90 0.5 - 1.9 mmol/L   Laboratory interpretation all normal except persistent leukocytosis, hyperglycemia    EKG  EKG Interpretation None       Radiology Dg Chest 2 View  Result Date: 03/24/2017 CLINICAL DATA:  36 y/o M; body aches and fever. Sepsis protocol. Left forearm wound.IMPRESSION: Bronchitic changes greatest in the lung bases. No focal consolidation. Electronically Signed   By: Mitzi Hansen M.D.   On: 03/24/2017 01:45    Procedures Procedures (including critical care time)  Medications Ordered in ED Medications  clindamycin (CLEOCIN) IVPB 600 mg (not administered)  penicillin G potassium 4 Million Units in dextrose 5 % 250 mL IVPB (not administered)     Initial Impression / Assessment and Plan / ED Course  I have  reviewed the triage vital signs and the nursing notes.  Pertinent labs & imaging results that were available during my care of the patient were reviewed by me and considered in my medical decision making (see chart for details).    Patient was examined and we discussed his test results.  We also discussed that he would need to be admitted for IV antibiotics.  After review patient was started on penicillin G 4,000,000 units every 6 hours and clindamycin 600 mg every 6 hours.  01:53 AM Dr Antionette Char, hospitalist will admit  Final Clinical Impressions(s) / ED Diagnoses   Final diagnoses:  Bacteremia due to Streptococcus  Abscess of bursa of left elbow  IV drug user    Plan admission  Devoria Albe, MD, Concha Pyo, MD 03/25/17 [phone]

## 2017-03-26 ENCOUNTER — Encounter (HOSPITAL_COMMUNITY): Payer: Self-pay | Admitting: Orthopaedic Surgery

## 2017-03-26 DIAGNOSIS — E871 Hypo-osmolality and hyponatremia: Secondary | ICD-10-CM

## 2017-03-26 DIAGNOSIS — F1910 Other psychoactive substance abuse, uncomplicated: Secondary | ICD-10-CM

## 2017-03-26 DIAGNOSIS — F1120 Opioid dependence, uncomplicated: Secondary | ICD-10-CM

## 2017-03-26 DIAGNOSIS — B955 Unspecified streptococcus as the cause of diseases classified elsewhere: Secondary | ICD-10-CM

## 2017-03-26 LAB — CBC
HEMATOCRIT: 39.1 % (ref 39.0–52.0)
HEMOGLOBIN: 13 g/dL (ref 13.0–17.0)
MCH: 29.5 pg (ref 26.0–34.0)
MCHC: 33.2 g/dL (ref 30.0–36.0)
MCV: 88.9 fL (ref 78.0–100.0)
PLATELETS: 201 10*3/uL (ref 150–400)
RBC: 4.4 MIL/uL (ref 4.22–5.81)
RDW: 13.8 % (ref 11.5–15.5)
WBC: 11 10*3/uL — AB (ref 4.0–10.5)

## 2017-03-26 LAB — BASIC METABOLIC PANEL
Anion gap: 9 (ref 5–15)
BUN: 5 mg/dL — AB (ref 6–20)
CO2: 22 mmol/L (ref 22–32)
CREATININE: 0.78 mg/dL (ref 0.61–1.24)
Calcium: 7.9 mg/dL — ABNORMAL LOW (ref 8.9–10.3)
Chloride: 103 mmol/L (ref 101–111)
GFR calc non Af Amer: >60 mL/min (ref >60)
GLUCOSE: 97 mg/dL (ref 65–99)
Potassium: 3.7 mmol/L (ref 3.5–5.1)
Sodium: 134 mmol/L — ABNORMAL LOW (ref 135–145)

## 2017-03-26 MED ORDER — HYDROMORPHONE HCL 1 MG/ML IJ SOLN
1.0000 mg | Freq: Four times a day (QID) | INTRAMUSCULAR | Status: DC | PRN
  Administered 2017-03-26 – 2017-03-27 (×2): 1 mg via INTRAVENOUS
  Filled 2017-03-26 (×2): qty 1

## 2017-03-26 MED ORDER — POTASSIUM CHLORIDE CRYS ER 20 MEQ PO TBCR
40.0000 meq | EXTENDED_RELEASE_TABLET | Freq: Once | ORAL | Status: AC
  Administered 2017-03-26: 40 meq via ORAL
  Filled 2017-03-26: qty 2

## 2017-03-26 NOTE — Progress Notes (Signed)
PROGRESS NOTE    [name]   UEA:[id number]  DOB: Jan 22, 1981  DOA: 03/25/2017 PCP: Patient, No Pcp Per   Brief Narrative:  [name] is a 36 y.o.malewith medical history significant for hyperlipidemia, psychosis, and IV drug abuse with opiate dependence, now presenting to the emergency department after he was called in for bacteremia. Patient had been seen in the ED approximately 24 hours ago for evaluation of the left forearm abscess with fevers and chills. He was noted to be febrile with a leukocytosis, but otherwise stable and was discharged home with doxycycline after he was treated in the emergency department with vancomycin and I&D. He reports taking 2 doses of doxycycline back at home before he was called in by the ED personnel due to his blood cultures from yesterday becoming positive for group A strep. Continues to have significant erythema around the abscess site in his left forearm, reports continued intermittent fevers, but reports resolution of body aches and feeling better overall than prior to the I&D.   Subjective: Feeling much better. Wants to go home today.  ROS: no complaints of nausea, vomiting, constipation diarrhea, cough, dyspnea or dysuria. No other complaints.   Assessment & Plan:  Bacteremia- strep pyogenes Left arm abscess IV drug abuse Leukocytosis, tachycardia>> Sepsis - cont Pen G and Clindamycin - sensitivity to Amoxil- Gen surgery would like to continue IV antibiotics for another 24 hrs - s/p I and D- no abscess seen but he had myositis, necrotic fascia and adipose tissue which was debrided - cont narcotics for pain control- start to wean   Active Problems:   HLD - Lipitor  Hyponatremia - sodium improved to 134 with IVF  Hypokalemia - Replaced-   Nicotine abuse - Nicotine patch ordered- smoking Cessation discussed  IV Drug abuse - has signed up with a Suboxone clinic and states he should be able to get in in the next 2  wks.  DVT prophylaxis:  ambulating Code Status: Full code Family Communication:   Disposition Plan: home tomorrow Consultants:   Hand sugery Procedures:   I and D Antimicrobials:  Anti-infectives    Start     Dose/Rate Route Frequency Ordered Stop   03/25/17 0800  clindamycin (CLEOCIN) IVPB 900 mg  Status:  Discontinued     900 mg 100 mL/hr over 30 Minutes Intravenous Every 8 hours 03/25/17 0202 03/26/17 1218   03/25/17 0400  penicillin G potassium 4 Million Units in dextrose 5 % 250 mL IVPB     4 Million Units 250 mL/hr over 60 Minutes Intravenous Every 4 hours 03/25/17 0202     03/25/17 0145  clindamycin (CLEOCIN) IVPB 600 mg     600 mg 100 mL/hr over 30 Minutes Intravenous  Once 03/25/17 0139 03/25/17 0402   03/25/17 0145  penicillin G potassium 4 Million Units in dextrose 5 % 250 mL IVPB  Status:  Discontinued     4 Million Units 250 mL/hr over 60 Minutes Intravenous Every 6 hours 03/25/17 0139 03/25/17 0202       Objective: Vitals:   03/25/17 1742 03/25/17 2033 03/26/17 0608 03/26/17 1019  BP: 126/73 131/75 (!) 113/58 (!) 97/57  Pulse: (!) 108 (!) 109 95 80  Resp: 16 16 17 18   Temp: 99 F (37.2 C) 99.4 F (37.4 C) 98.4 F (36.9 C) 98.5 F (36.9 C)  TempSrc: Oral Oral Oral Oral  SpO2: 98% 96% 98% 99%  Weight:  108.6 kg (239 lb 7.5 oz)    Height:  Intake/Output Summary (Last 24 hours) at 03/26/17 1234 Last data filed at 03/26/17 1020  Gross per 24 hour  Intake             3202 ml  Output             1175 ml  Net             2027 ml   Filed Weights   03/24/17 2111 03/25/17 0355 03/25/17 2033  Weight: 108.9 kg (240 lb) 108.9 kg (239 lb 15.9 oz) 108.6 kg (239 lb 7.5 oz)    Examination: General exam: Appears comfortable  HEENT: PERRLA, oral mucosa moist, no sclera icterus or thrush Respiratory system: Clear to auscultation. Respiratory effort normal. Cardiovascular system: S1 & S2 heard, RRR.  No murmurs  Gastrointestinal system: Abdomen soft,  non-tender, nondistended. Normal bowel sound. No organomegaly Central nervous system: Alert and oriented. No focal neurological deficits. Extremities: No cyanosis, clubbing left arm edema around dressing Skin: No rashes or ulcers- left arm dressing not opened today Psychiatry:  Mood & affect appropriate.     Data Reviewed: I have personally reviewed following labs and imaging studies  CBC:  Recent Labs Lab 03/24/17 0122 03/24/17 2229 03/25/17 0213 03/26/17 0426  WBC 16.1* 15.4* 14.2* 11.0*  NEUTROABS  --  11.2* 9.5*  --   HGB 15.7 14.4 14.5 13.0  HCT 44.5 42.4 42.3 39.1  MCV 88.5 88.5 88.3 88.9  PLT 204 188 180 201   Basic Metabolic Panel:  Recent Labs Lab 03/24/17 0122 03/24/17 2137 03/25/17 0213 03/25/17 0830 03/26/17 0426  NA 134* 132* 134*  --  134*  K 3.5 3.7 3.3*  --  3.7  CL 98* 99* 102  --  103  CO2 24 22 22   --  22  GLUCOSE 133* 126* 110*  --  97  BUN 6 7 6   --  5*  CREATININE 0.88 0.86 0.81  --  0.78  CALCIUM 8.8* 8.7* 8.7*  --  7.9*  MG  --   --   --  1.8  --    GFR: Estimated Creatinine Clearance: 160 mL/min (by C-G formula based on SCr of 0.78 mg/dL). Liver Function Tests:  Recent Labs Lab 03/24/17 2137  AST 26  ALT 25  ALKPHOS 85  BILITOT 0.9  PROT 8.4*  ALBUMIN 3.5   No results for input(s): LIPASE, AMYLASE in the last 168 hours. No results for input(s): AMMONIA in the last 168 hours. Coagulation Profile:  Recent Labs Lab 03/24/17 0122  INR 1.00   Cardiac Enzymes: No results for input(s): CKTOTAL, CKMB, CKMBINDEX, TROPONINI in the last 168 hours. BNP (last 3 results) No results for input(s): PROBNP in the last 8760 hours. HbA1C: No results for input(s): HGBA1C in the last 72 hours. CBG: No results for input(s): GLUCAP in the last 168 hours. Lipid Profile: No results for input(s): CHOL, HDL, LDLCALC, TRIG, CHOLHDL, LDLDIRECT in the last 72 hours. Thyroid Function Tests: No results for input(s): TSH, T4TOTAL, FREET4, T3FREE,  THYROIDAB in the last 72 hours. Anemia Panel: No results for input(s): VITAMINB12, FOLATE, FERRITIN, TIBC, IRON, RETICCTPCT in the last 72 hours. Urine analysis:    Component Value Date/Time   COLORURINE YELLOW 03/24/2017 0131   APPEARANCEUR CLEAR 03/24/2017 0131   LABSPEC 1.018 03/24/2017 0131   PHURINE 6.0 03/24/2017 0131   GLUCOSEU NEGATIVE 03/24/2017 0131   HGBUR NEGATIVE 03/24/2017 0131   BILIRUBINUR NEGATIVE 03/24/2017 0131   KETONESUR NEGATIVE 03/24/2017 0131   PROTEINUR  NEGATIVE 03/24/2017 0131   UROBILINOGEN 1.0 03/15/2015 2220   NITRITE NEGATIVE 03/24/2017 0131   LEUKOCYTESUR NEGATIVE 03/24/2017 0131   Sepsis Labs: @LABRCNTIP (procalcitonin:4,lacticidven:4) ) Recent Results (from the past 240 hour(s))  Culture, blood (Routine x 2)     Status: None (Preliminary result)   Collection Time: 03/24/17  1:05 AM  Result Value Ref Range Status   Specimen Description BLOOD LEFT HAND  Final   Special Requests   Final    IN PEDIATRIC BOTTLE Blood Culture results may not be optimal due to an excessive volume of blood received in culture bottles   Culture NO GROWTH 2 DAYS  Final   Report Status PENDING  Incomplete  Culture, blood (Routine x 2)     Status: Abnormal   Collection Time: 03/24/17  1:24 AM  Result Value Ref Range Status   Specimen Description BLOOD RIGHT HAND  Final   Special Requests   Final    BOTTLES DRAWN AEROBIC AND ANAEROBIC Blood Culture adequate volume   Culture  Setup Time   Final    GRAM POSITIVE COCCI IN CHAINS IN BOTH AEROBIC AND ANAEROBIC BOTTLES CRITICAL RESULT CALLED TO, READ BACK BY AND VERIFIED WITH: K CONNOR,RN AT 1859 03/24/17 BY L BENFIELD    Culture GROUP A STREP (S.PYOGENES) ISOLATED (A)  Final   Report Status 03/26/2017 FINAL  Final   Organism ID, Bacteria GROUP A STREP (S.PYOGENES) ISOLATED  Final      Susceptibility   Group a strep (s.pyogenes) isolated - MIC*    PENICILLIN <=0.06 SENSITIVE Sensitive     CEFTRIAXONE <=0.12 SENSITIVE  Sensitive     ERYTHROMYCIN >=8 RESISTANT Resistant     LEVOFLOXACIN 0.5 SENSITIVE Sensitive     VANCOMYCIN 0.25 SENSITIVE Sensitive     * GROUP A STREP (S.PYOGENES) ISOLATED  Blood Culture ID Panel (Reflexed)     Status: Abnormal   Collection Time: 03/24/17  1:24 AM  Result Value Ref Range Status   Enterococcus species NOT DETECTED NOT DETECTED Final   Listeria monocytogenes NOT DETECTED NOT DETECTED Final   Staphylococcus species NOT DETECTED NOT DETECTED Final   Staphylococcus aureus NOT DETECTED NOT DETECTED Final   Streptococcus species DETECTED (A) NOT DETECTED Final    Comment: CRITICAL RESULT CALLED TO, READ BACK BY AND VERIFIED WITH: K CONNOR,RN AT 1859 03/24/17 BY L BENFIELD    Streptococcus agalactiae NOT DETECTED NOT DETECTED Final   Streptococcus pneumoniae NOT DETECTED NOT DETECTED Final   Streptococcus pyogenes DETECTED (A) NOT DETECTED Final    Comment: CRITICAL RESULT CALLED TO, READ BACK BY AND VERIFIED WITH: K CONNOR,RN AT 1859 03/24/17 BY L BENFIELD    Acinetobacter baumannii NOT DETECTED NOT DETECTED Final   Enterobacteriaceae species NOT DETECTED NOT DETECTED Final   Enterobacter cloacae complex NOT DETECTED NOT DETECTED Final   Escherichia coli NOT DETECTED NOT DETECTED Final   Klebsiella oxytoca NOT DETECTED NOT DETECTED Final   Klebsiella pneumoniae NOT DETECTED NOT DETECTED Final   Proteus species NOT DETECTED NOT DETECTED Final   Serratia marcescens NOT DETECTED NOT DETECTED Final   Haemophilus influenzae NOT DETECTED NOT DETECTED Final   Neisseria meningitidis NOT DETECTED NOT DETECTED Final   Pseudomonas aeruginosa NOT DETECTED NOT DETECTED Final   Candida albicans NOT DETECTED NOT DETECTED Final   Candida glabrata NOT DETECTED NOT DETECTED Final   Candida krusei NOT DETECTED NOT DETECTED Final   Candida parapsilosis NOT DETECTED NOT DETECTED Final   Candida tropicalis NOT DETECTED NOT DETECTED Final  Culture, blood (Routine x 2)     Status: None  (Preliminary result)   Collection Time: 03/24/17  9:38 PM  Result Value Ref Range Status   Specimen Description BLOOD RIGHT HAND  Final   Special Requests IN PEDIATRIC BOTTLE Blood Culture adequate volume  Final   Culture NO GROWTH 2 DAYS  Final   Report Status PENDING  Incomplete  Culture, blood (Routine x 2)     Status: None (Preliminary result)   Collection Time: 03/24/17  9:39 PM  Result Value Ref Range Status   Specimen Description BLOOD LEFT HAND  Final   Special Requests   Final    BOTTLES DRAWN AEROBIC AND ANAEROBIC Blood Culture adequate volume   Culture NO GROWTH 2 DAYS  Final   Report Status PENDING  Incomplete  Surgical PCR screen     Status: None   Collection Time: 03/25/17 12:13 PM  Result Value Ref Range Status   MRSA, PCR NEGATIVE NEGATIVE Final   Staphylococcus aureus NEGATIVE NEGATIVE Final    Comment: (NOTE) The Xpert SA Assay (FDA approved for NASAL specimens in patients 61 years of age and older), is one component of a comprehensive surveillance program. It is not intended to diagnose infection nor to guide or monitor treatment.          Radiology Studies: No results found.    Scheduled Meds: . atorvastatin  40 mg Oral q1800  . chlorhexidine  60 mL Topical Once  . cholecalciferol  1,000 Units Oral Daily  . nicotine  14 mg Transdermal Daily  . povidone-iodine  2 application Topical Once  . QUEtiapine  300 mg Oral QHS   Continuous Infusions: . pencillin G potassium IV Stopped (03/26/17 0920)     LOS: 1 day    Time spent in minutes: 35    Calvert Cantor, MD [facility] Pager: [email or web address] Password [facility] 03/26/2017, 12:34 PM

## 2017-03-26 NOTE — Telephone Encounter (Signed)
Error

## 2017-03-26 NOTE — Progress Notes (Signed)
Patient ID: [name], male   DOB: Apr 15, 1981, 36 y.o.   MRN: [id number] His left arm looks much better today.  The cellulitis is resolving.  He tolerated surgery well yesterday and I found no gross purulence.  I placed a new dressing today.  He needs only one more day of IV antibiotics and can be discharged to home tomorrow on oral antibiotics.

## 2017-03-27 DIAGNOSIS — L03114 Cellulitis of left upper limb: Secondary | ICD-10-CM

## 2017-03-27 LAB — HEPATITIS PANEL, ACUTE
HCV Ab: >11 s/co ratio — ABNORMAL HIGH (ref 0.0–0.9)
Hep A IgM: NEGATIVE
Hep B C IgM: NEGATIVE
Hepatitis B Surface Ag: NEGATIVE

## 2017-03-27 LAB — CBC
HCT: 43.5 % (ref 39.0–52.0)
HEMOGLOBIN: 15.2 g/dL (ref 13.0–17.0)
MCH: 30.4 pg (ref 26.0–34.0)
MCHC: 34.9 g/dL (ref 30.0–36.0)
MCV: 87 fL (ref 78.0–100.0)
Platelets: 210 10*3/uL (ref 150–400)
RBC: 5 MIL/uL (ref 4.22–5.81)
RDW: 13.6 % (ref 11.5–15.5)
WBC: 8.8 10*3/uL (ref 4.0–10.5)

## 2017-03-27 LAB — BASIC METABOLIC PANEL
ANION GAP: 8 (ref 5–15)
CHLORIDE: 101 mmol/L (ref 101–111)
CO2: 26 mmol/L (ref 22–32)
Calcium: 9.2 mg/dL (ref 8.9–10.3)
Creatinine, Ser: 0.78 mg/dL (ref 0.61–1.24)
GFR calc Af Amer: >60 mL/min (ref >60)
GFR calc non Af Amer: >60 mL/min (ref >60)
Glucose, Bld: 175 mg/dL — ABNORMAL HIGH (ref 65–99)
POTASSIUM: 3.7 mmol/L (ref 3.5–5.1)
SODIUM: 135 mmol/L (ref 135–145)

## 2017-03-27 MED ORDER — LEVOFLOXACIN 750 MG PO TABS: 750.0000 mg | ORAL_TABLET | Freq: Every day | ORAL | 0 refills | Status: DC

## 2017-03-27 MED ORDER — FAMOTIDINE 20 MG PO TABS: 20.0000 mg | ORAL_TABLET | Freq: Two times a day (BID) | ORAL | Status: DC | PRN

## 2017-03-27 MED ORDER — AMOXICILLIN 500 MG PO CAPS: 1000.0000 mg | ORAL_CAPSULE | Freq: Three times a day (TID) | ORAL | 0 refills | Status: DC

## 2017-03-27 MED ORDER — BISACODYL 5 MG PO TBEC: 5.0000 mg | DELAYED_RELEASE_TABLET | Freq: Every day | ORAL | 0 refills | Status: DC | PRN

## 2017-03-27 MED ORDER — SENNOSIDES-DOCUSATE SODIUM 8.6-50 MG PO TABS: 1.0000 | ORAL_TABLET | Freq: Every evening | ORAL | Status: DC | PRN

## 2017-03-27 MED ORDER — FAMOTIDINE 20 MG PO TABS: 20.0000 mg | ORAL_TABLET | Freq: Two times a day (BID) | ORAL | Status: DC

## 2017-03-27 MED ORDER — LEVOFLOXACIN 750 MG PO TABS: 750.0000 mg | ORAL_TABLET | Freq: Every day | ORAL | 0 refills | Status: AC

## 2017-03-27 NOTE — Discharge Instructions (Signed)
Increase the use of your left arm as comfort allows. You can get the actual incision wet directly in the shower in 4-5 days. Please avoid use of any illegal drugs.  Please take all your medications with you for your next visit with your Primary MD. Please request your Primary MD to go over all hospital test results at the follow up. Please ask your Primary MD to get all Hospital records sent to his/her office.  If you experience worsening of your admission symptoms, develop shortness of breath, chest pain, suicidal or homicidal thoughts or a life threatening emergency, you must seek medical attention immediately by calling 911 or calling your MD.  William QuinYou must read the complete instructions/literature along with all the possible adverse reactions/side effects for all the medicines you take including new medications that have been prescribed to you. Take new medicines after you have completely understood and accpet all the possible adverse reactions/side effects.   Do not drive when taking pain medications or sedatives.    Do not take more than prescribed Pain, Sleep and Anxiety Medications  If you have smoked or chewed Tobacco in the last 2 yrs please stop. Stop any regular alcohol and or recreational drug use.  Wear Seat belts while driving.

## 2017-03-27 NOTE — Care Management Note (Addendum)
Case Management Note  Patient Details  Name: [name] MRN: [id number] Date of Birth: 17-Jul-1980  Subjective/Objective:       CM following for progression and d/c planning.              Action/Plan: 03/27/2017 request from Dr Butler Denmarkizwan re MATCH for this pt , as he is uninsured. This CM prepared MATCH letter and went to pt room to give letter and explain.  [facility]. This CM place a call to the pt and will attempt to contact the pt father with whom the pt lives re returning to the hospital to pick up the prescription and MATCH letter.  12:36 This CM spoke with pt father, [name] who states that he will informed the pt to return to the hospital to pick up his prescription and MATCH letter.  Expected Discharge Date:  03/27/17               Expected Discharge Plan:  Home/Self Care  In-House Referral:  NA  Discharge planning Services  CM Consult, Medication Assistance, MATCH Program  Post Acute Care Choice:  NA Choice offered to:  NA  DME Arranged:  N/A DME Agency:  NA  HH Arranged:  NA HH Agency:  NA  Status of Service:  Completed, signed off  If discussed at Long Length of Stay Meetings, dates discussed:    Additional Comments:  Starlyn SkeansRoyal, [name], RN 03/27/2017, 12:28 PM

## 2017-03-27 NOTE — Discharge Summary (Addendum)
Physician Discharge Summary  [name] UUV:[id number] DOB: 01/03/1981 DOA: 03/25/2017  PCP: Patient, No Pcp Per  Admit date: 03/25/2017 Discharge date: 03/27/2017  Admitted From: home  Disposition:  home   Recommendations for Outpatient Follow-up:  1. F/u with Suboxone clinic  Discharge Condition:  stable   CODE STATUS:  Full code   Consultations:  Hand surgery    Discharge Diagnoses:  Principal Problem:   Bacteremia Active Problems:   Cellulitis of left arm   Hyponatremia   Abscess of arm, left   Psychoses (HCC)   Opiate dependence, continuous (HCC)   IV drug abuse (HCC)  hyperlipidemia  Nicotine abuse   Subjective: No complaints of fatigue, fever, chills, nausea, vomiting, diarrhea. His pain is not very severe.   Brief Summary: William Mapleshomas E Imran is a 36 y.o.malewith medical history significant for hyperlipidemia, psychosis, and IV drug abuse with opiate dependence, now presenting to the emergency department after he was called in for bacteremia. Patient had been seen in the ED approximately 24 hours ago for evaluation of the left forearm abscess with fevers and chills. He was noted to be febrile with a leukocytosis, but otherwise stable and was discharged home with doxycycline after he was treated in the emergency department with vancomycin and I&D. He reports taking 2 doses of doxycycline back at home before he was called in by the ED personnel due to his blood cultures from yesterday becoming positive for group A strep. Continues to have significant erythema around the abscess site in his left forearm, reports continued intermittent fevers, but reports resolution of body aches and feeling better overall than prior to the I&D.  Hospital Course:   Bacteremia- strep pyogenes Left arm abscess IV drug abuse Leukocytosis, tachycardia>> Sepsis - has been receiving Pen G and Clindamycin - sensitive to Levaquin- will transition to this for remaining course (total 14  days) - s/p I and D- no abscess seen but he had myositis, necrotic fascia and adipose tissue which was debrided - WB count 16.1>> 8.8 - Motrin PRN for pain after dicharge  Active Problems:  HLD - Lipitor  Hyponatremia - sodium 134-132- not much improvement after IVF  Hypokalemia - Replaced-   Nicotine abuse - Nicotine patch ordered- smoking Cessation discussed  IV Drug abuse - has signed up with a Suboxone clinic and states he should be able to get in in the next 2 wks.   Discharge Instructions  Discharge Instructions    Diet - low sodium heart healthy    Complete by:  As directed    Increase activity slowly    Complete by:  As directed      Allergies as of 03/27/2017   No Known Allergies     Medication List    STOP taking these medications   doxycycline 100 MG capsule Commonly known as:  VIBRAMYCIN     TAKE these medications   atorvastatin 40 MG tablet Commonly known as:  LIPITOR Take 40 mg by mouth daily.   bisacodyl 5 MG EC tablet Commonly known as:  DULCOLAX Take 1 tablet (5 mg total) by mouth daily as needed for moderate constipation.   cholecalciferol 1000 units tablet Commonly known as:  VITAMIN D Take 1,000 Units by mouth daily.   famotidine 20 MG tablet Commonly known as:  PEPCID Take 1 tablet (20 mg total) by mouth 2 (two) times daily as needed for heartburn or indigestion.   levofloxacin 750 MG tablet Commonly known as:  LEVAQUIN Take 1 tablet (  750 mg total) by mouth daily.   oxyCODONE-acetaminophen 5-325 MG tablet Commonly known as:  PERCOCET Take 1 tablet by mouth every 4 (four) hours as needed for moderate pain.   QUEtiapine 300 MG tablet Commonly known as:  SEROQUEL Take 300 mg by mouth at bedtime.   senna-docusate 8.6-50 MG tablet Commonly known as:  Senokot-S Take 1 tablet by mouth at bedtime as needed for mild constipation.      Follow-up Information    Kathryne Hitch, MD. Schedule an appointment as soon  as possible for a visit in 2 week(s).   Specialty:  Orthopedic Surgery Contact information: [address] [place] [address] [phone]          No Known Allergies   Procedures/Studies: 10/21 Irrigation and debridement of left arm antecubital fossa tissue with sharp excisional debridement of minimal necrotic adipose tissue and fascia.  Dg Chest 2 View  Result Date: 03/24/2017 CLINICAL DATA:  36 y/o M; body aches and fever. Sepsis protocol. Left forearm wound. EXAM: CHEST  2 VIEW COMPARISON:  03/16/2015 chest radiograph FINDINGS: Stable normal cardiac silhouette. Bones are unremarkable. No pleural effusion or pneumothorax. Bronchitic changes greatest in the lung bases. No focal consolidation. IMPRESSION: Bronchitic changes greatest in the lung bases. No focal consolidation. Electronically Signed   By: Mitzi Hansen M.D.   On: 03/24/2017 01:45       Discharge Exam: Vitals:   03/27/17 0441 03/27/17 1009  BP: 124/68 (!) 140/93  Pulse: 93 (!) 107  Resp: 18 20  Temp: 98.3 F (36.8 C) 98.2 F (36.8 C)  SpO2: 95% 100%   Vitals:   03/26/17 1747 03/26/17 2028 03/27/17 0441 03/27/17 1009  BP: (!) 110/55 122/63 124/68 (!) 140/93  Pulse: 92 83 93 (!) 107  Resp: 18 17 18 20   Temp: 98.2 F (36.8 C) 98 F (36.7 C) 98.3 F (36.8 C) 98.2 F (36.8 C)  TempSrc: Oral   Oral  SpO2: 99% 99% 95% 100%  Weight:  116.1 kg (256 lb)    Height:        General: Pt is alert, awake, not in acute distress Cardiovascular: RRR, S1/S2 +, no rubs, no gallops Respiratory: CTA bilaterally, no wheezing, no rhonchi Abdominal: Soft, NT, ND, bowel sounds + Extremities: no edema, no cyanosis Skin: sutures on left forearm intact- mild erythema and tenderness below area of stitches    The results of significant diagnostics from this hospitalization (including imaging, microbiology, ancillary and laboratory) are listed below for reference.     Microbiology: Recent Results (from  the past 240 hour(s))  Culture, blood (Routine x 2)     Status: None (Preliminary result)   Collection Time: 03/24/17  1:05 AM  Result Value Ref Range Status   Specimen Description BLOOD LEFT HAND  Final   Special Requests   Final    IN PEDIATRIC BOTTLE Blood Culture results may not be optimal due to an excessive volume of blood received in culture bottles   Culture NO GROWTH 2 DAYS  Final   Report Status PENDING  Incomplete  Culture, blood (Routine x 2)     Status: Abnormal   Collection Time: 03/24/17  1:24 AM  Result Value Ref Range Status   Specimen Description BLOOD RIGHT HAND  Final   Special Requests   Final    BOTTLES DRAWN AEROBIC AND ANAEROBIC Blood Culture adequate volume   Culture  Setup Time   Final    GRAM POSITIVE COCCI IN CHAINS IN BOTH  AEROBIC AND ANAEROBIC BOTTLES CRITICAL RESULT CALLED TO, READ BACK BY AND VERIFIED WITH: K CONNOR,RN AT 1859 03/24/17 BY L BENFIELD    Culture GROUP A STREP (S.PYOGENES) ISOLATED (A)  Final   Report Status 03/26/2017 FINAL  Final   Organism ID, Bacteria GROUP A STREP (S.PYOGENES) ISOLATED  Final      Susceptibility   Group a strep (s.pyogenes) isolated - MIC*    PENICILLIN <=0.06 SENSITIVE Sensitive     CEFTRIAXONE <=0.12 SENSITIVE Sensitive     ERYTHROMYCIN >=8 RESISTANT Resistant     LEVOFLOXACIN 0.5 SENSITIVE Sensitive     VANCOMYCIN 0.25 SENSITIVE Sensitive     * GROUP A STREP (S.PYOGENES) ISOLATED  Blood Culture ID Panel (Reflexed)     Status: Abnormal   Collection Time: 03/24/17  1:24 AM  Result Value Ref Range Status   Enterococcus species NOT DETECTED NOT DETECTED Final   Listeria monocytogenes NOT DETECTED NOT DETECTED Final   Staphylococcus species NOT DETECTED NOT DETECTED Final   Staphylococcus aureus NOT DETECTED NOT DETECTED Final   Streptococcus species DETECTED (A) NOT DETECTED Final    Comment: CRITICAL RESULT CALLED TO, READ BACK BY AND VERIFIED WITH: K CONNOR,RN AT 1859 03/24/17 BY L BENFIELD    Streptococcus  agalactiae NOT DETECTED NOT DETECTED Final   Streptococcus pneumoniae NOT DETECTED NOT DETECTED Final   Streptococcus pyogenes DETECTED (A) NOT DETECTED Final    Comment: CRITICAL RESULT CALLED TO, READ BACK BY AND VERIFIED WITH: K CONNOR,RN AT 1859 03/24/17 BY L BENFIELD    Acinetobacter baumannii NOT DETECTED NOT DETECTED Final   Enterobacteriaceae species NOT DETECTED NOT DETECTED Final   Enterobacter cloacae complex NOT DETECTED NOT DETECTED Final   Escherichia coli NOT DETECTED NOT DETECTED Final   Klebsiella oxytoca NOT DETECTED NOT DETECTED Final   Klebsiella pneumoniae NOT DETECTED NOT DETECTED Final   Proteus species NOT DETECTED NOT DETECTED Final   Serratia marcescens NOT DETECTED NOT DETECTED Final   Haemophilus influenzae NOT DETECTED NOT DETECTED Final   Neisseria meningitidis NOT DETECTED NOT DETECTED Final   Pseudomonas aeruginosa NOT DETECTED NOT DETECTED Final   Candida albicans NOT DETECTED NOT DETECTED Final   Candida glabrata NOT DETECTED NOT DETECTED Final   Candida krusei NOT DETECTED NOT DETECTED Final   Candida parapsilosis NOT DETECTED NOT DETECTED Final   Candida tropicalis NOT DETECTED NOT DETECTED Final  Culture, blood (Routine x 2)     Status: None (Preliminary result)   Collection Time: 03/24/17  9:38 PM  Result Value Ref Range Status   Specimen Description BLOOD RIGHT HAND  Final   Special Requests IN PEDIATRIC BOTTLE Blood Culture adequate volume  Final   Culture NO GROWTH 2 DAYS  Final   Report Status PENDING  Incomplete  Culture, blood (Routine x 2)     Status: None (Preliminary result)   Collection Time: 03/24/17  9:39 PM  Result Value Ref Range Status   Specimen Description BLOOD LEFT HAND  Final   Special Requests   Final    BOTTLES DRAWN AEROBIC AND ANAEROBIC Blood Culture adequate volume   Culture NO GROWTH 2 DAYS  Final   Report Status PENDING  Incomplete  Surgical PCR screen     Status: None   Collection Time: 03/25/17 12:13 PM  Result  Value Ref Range Status   MRSA, PCR NEGATIVE NEGATIVE Final   Staphylococcus aureus NEGATIVE NEGATIVE Final    Comment: (NOTE) The Xpert SA Assay (FDA approved for NASAL specimens in patients  20 years of age and older), is one component of a comprehensive surveillance program. It is not intended to diagnose infection nor to guide or monitor treatment.      Labs: BNP (last 3 results) No results for input(s): BNP in the last 8760 hours. Basic Metabolic Panel:  Recent Labs Lab 03/24/17 0122 03/24/17 2137 03/25/17 0213 03/25/17 0830 03/26/17 0426 03/27/17 1028  NA 134* 132* 134*  --  134* 135  K 3.5 3.7 3.3*  --  3.7 3.7  CL 98* 99* 102  --  103 101  CO2 24 22 22   --  22 26  GLUCOSE 133* 126* 110*  --  97 175*  BUN 6 7 6   --  5* <5*  CREATININE 0.88 0.86 0.81  --  0.78 0.78  CALCIUM 8.8* 8.7* 8.7*  --  7.9* 9.2  MG  --   --   --  1.8  --   --    Liver Function Tests:  Recent Labs Lab 03/24/17 2137  AST 26  ALT 25  ALKPHOS 85  BILITOT 0.9  PROT 8.4*  ALBUMIN 3.5   No results for input(s): LIPASE, AMYLASE in the last 168 hours. No results for input(s): AMMONIA in the last 168 hours. CBC:  Recent Labs Lab 03/24/17 0122 03/24/17 2229 03/25/17 0213 03/26/17 0426 03/27/17 0806  WBC 16.1* 15.4* 14.2* 11.0* 8.8  NEUTROABS  --  11.2* 9.5*  --   --   HGB 15.7 14.4 14.5 13.0 15.2  HCT 44.5 42.4 42.3 39.1 43.5  MCV 88.5 88.5 88.3 88.9 87.0  PLT 204 188 180 201 210   Cardiac Enzymes: No results for input(s): CKTOTAL, CKMB, CKMBINDEX, TROPONINI in the last 168 hours. BNP: Invalid input(s): POCBNP CBG: No results for input(s): GLUCAP in the last 168 hours. D-Dimer No results for input(s): DDIMER in the last 72 hours. Hgb A1c No results for input(s): HGBA1C in the last 72 hours. Lipid Profile No results for input(s): CHOL, HDL, LDLCALC, TRIG, CHOLHDL, LDLDIRECT in the last 72 hours. Thyroid function studies No results for input(s): TSH, T4TOTAL, T3FREE,  THYROIDAB in the last 72 hours.  Invalid input(s): FREET3 Anemia work up No results for input(s): VITAMINB12, FOLATE, FERRITIN, TIBC, IRON, RETICCTPCT in the last 72 hours. Urinalysis    Component Value Date/Time   COLORURINE YELLOW 03/24/2017 0131   APPEARANCEUR CLEAR 03/24/2017 0131   LABSPEC 1.018 03/24/2017 0131   PHURINE 6.0 03/24/2017 0131   GLUCOSEU NEGATIVE 03/24/2017 0131   HGBUR NEGATIVE 03/24/2017 0131   BILIRUBINUR NEGATIVE 03/24/2017 0131   KETONESUR NEGATIVE 03/24/2017 0131   PROTEINUR NEGATIVE 03/24/2017 0131   UROBILINOGEN 1.0 03/15/2015 2220   NITRITE NEGATIVE 03/24/2017 0131   LEUKOCYTESUR NEGATIVE 03/24/2017 0131   Sepsis Labs Invalid input(s): PROCALCITONIN,  WBC,  LACTICIDVEN Microbiology Recent Results (from the past 240 hour(s))  Culture, blood (Routine x 2)     Status: None (Preliminary result)   Collection Time: 03/24/17  1:05 AM  Result Value Ref Range Status   Specimen Description BLOOD LEFT HAND  Final   Special Requests   Final    IN PEDIATRIC BOTTLE Blood Culture results may not be optimal due to an excessive volume of blood received in culture bottles   Culture NO GROWTH 2 DAYS  Final   Report Status PENDING  Incomplete  Culture, blood (Routine x 2)     Status: Abnormal   Collection Time: 03/24/17  1:24 AM  Result Value Ref Range Status  Specimen Description BLOOD RIGHT HAND  Final   Special Requests   Final    BOTTLES DRAWN AEROBIC AND ANAEROBIC Blood Culture adequate volume   Culture  Setup Time   Final    GRAM POSITIVE COCCI IN CHAINS IN BOTH AEROBIC AND ANAEROBIC BOTTLES CRITICAL RESULT CALLED TO, READ BACK BY AND VERIFIED WITH: K CONNOR,RN AT 1859 03/24/17 BY L BENFIELD    Culture GROUP A STREP (S.PYOGENES) ISOLATED (A)  Final   Report Status 03/26/2017 FINAL  Final   Organism ID, Bacteria GROUP A STREP (S.PYOGENES) ISOLATED  Final      Susceptibility   Group a strep (s.pyogenes) isolated - MIC*    PENICILLIN <=0.06 SENSITIVE  Sensitive     CEFTRIAXONE <=0.12 SENSITIVE Sensitive     ERYTHROMYCIN >=8 RESISTANT Resistant     LEVOFLOXACIN 0.5 SENSITIVE Sensitive     VANCOMYCIN 0.25 SENSITIVE Sensitive     * GROUP A STREP (S.PYOGENES) ISOLATED  Blood Culture ID Panel (Reflexed)     Status: Abnormal   Collection Time: 03/24/17  1:24 AM  Result Value Ref Range Status   Enterococcus species NOT DETECTED NOT DETECTED Final   Listeria monocytogenes NOT DETECTED NOT DETECTED Final   Staphylococcus species NOT DETECTED NOT DETECTED Final   Staphylococcus aureus NOT DETECTED NOT DETECTED Final   Streptococcus species DETECTED (A) NOT DETECTED Final    Comment: CRITICAL RESULT CALLED TO, READ BACK BY AND VERIFIED WITH: K CONNOR,RN AT 1859 03/24/17 BY L BENFIELD    Streptococcus agalactiae NOT DETECTED NOT DETECTED Final   Streptococcus pneumoniae NOT DETECTED NOT DETECTED Final   Streptococcus pyogenes DETECTED (A) NOT DETECTED Final    Comment: CRITICAL RESULT CALLED TO, READ BACK BY AND VERIFIED WITH: K CONNOR,RN AT 1859 03/24/17 BY L BENFIELD    Acinetobacter baumannii NOT DETECTED NOT DETECTED Final   Enterobacteriaceae species NOT DETECTED NOT DETECTED Final   Enterobacter cloacae complex NOT DETECTED NOT DETECTED Final   Escherichia coli NOT DETECTED NOT DETECTED Final   Klebsiella oxytoca NOT DETECTED NOT DETECTED Final   Klebsiella pneumoniae NOT DETECTED NOT DETECTED Final   Proteus species NOT DETECTED NOT DETECTED Final   Serratia marcescens NOT DETECTED NOT DETECTED Final   Haemophilus influenzae NOT DETECTED NOT DETECTED Final   Neisseria meningitidis NOT DETECTED NOT DETECTED Final   Pseudomonas aeruginosa NOT DETECTED NOT DETECTED Final   Candida albicans NOT DETECTED NOT DETECTED Final   Candida glabrata NOT DETECTED NOT DETECTED Final   Candida krusei NOT DETECTED NOT DETECTED Final   Candida parapsilosis NOT DETECTED NOT DETECTED Final   Candida tropicalis NOT DETECTED NOT DETECTED Final   Culture, blood (Routine x 2)     Status: None (Preliminary result)   Collection Time: 03/24/17  9:38 PM  Result Value Ref Range Status   Specimen Description BLOOD RIGHT HAND  Final   Special Requests IN PEDIATRIC BOTTLE Blood Culture adequate volume  Final   Culture NO GROWTH 2 DAYS  Final   Report Status PENDING  Incomplete  Culture, blood (Routine x 2)     Status: None (Preliminary result)   Collection Time: 03/24/17  9:39 PM  Result Value Ref Range Status   Specimen Description BLOOD LEFT HAND  Final   Special Requests   Final    BOTTLES DRAWN AEROBIC AND ANAEROBIC Blood Culture adequate volume   Culture NO GROWTH 2 DAYS  Final   Report Status PENDING  Incomplete  Surgical PCR screen  Status: None   Collection Time: 03/25/17 12:13 PM  Result Value Ref Range Status   MRSA, PCR NEGATIVE NEGATIVE Final   Staphylococcus aureus NEGATIVE NEGATIVE Final    Comment: (NOTE) The Xpert SA Assay (FDA approved for NASAL specimens in patients 46 years of age and older), is one component of a comprehensive surveillance program. It is not intended to diagnose infection nor to guide or monitor treatment.      Time coordinating discharge: Over 30 minutes  SIGNED:   Calvert Cantor, MD  [facility] 03/27/2017, 12:45 PM Pager   If 7PM-7AM, please contact night-coverage [email or web address] Password [email or web address]

## 2017-03-27 NOTE — Progress Notes (Signed)
Patient ID: [name], male   DOB: 1981-03-28, 36 y.o.   MRN: [id number] Clinically much better overall.  Minimal pain.  Redness almost gone and incision at left arm anti-cubital area looks good.  Can be discharged from Ortho standpoint today on oral antibiotics for 2 weeks.  I will see him in the office in follow-up in 2 weeks.

## 2017-03-27 NOTE — Progress Notes (Signed)
Tech offered assistance with a bath. Pt stated that he will take care of bath later. Tech gave Pt all bath supplies.

## 2017-03-27 NOTE — Progress Notes (Signed)
Pt discharged home via ambulation without difficulty. AVS and scripts reviewed in full with patient. All questions answered. Pt belongings sent with him. VSS. BP (!) 140/93 (BP Location: Right Arm)   Pulse (!) 107   Temp 98.2 F (36.8 C) (Oral)   Resp 20   Ht 5\' 11"  (1.803 m)   Wt 116.1 kg (256 lb)   SpO2 100%   BMI 35.70 kg/m

## 2017-03-29 LAB — CULTURE, BLOOD (ROUTINE X 2)
CULTURE: NO GROWTH
Culture: NO GROWTH
Culture: NO GROWTH
Special Requests: ADEQUATE
Special Requests: ADEQUATE

## 2017-04-09 LAB — CULTURE, BLOOD (ROUTINE X 2): SPECIAL REQUESTS: ADEQUATE

## 2019-05-08 IMAGING — CR DG CHEST 2V
2 series · 2 of 2 positions shown · non-contrast
Comparison: 03/16/2015 chest radiograph

CLINICAL DATA: 36 y/o M; body aches and fever. Sepsis protocol.
Left forearm wound.

EXAM:
CHEST  2 VIEW

[chest pa]
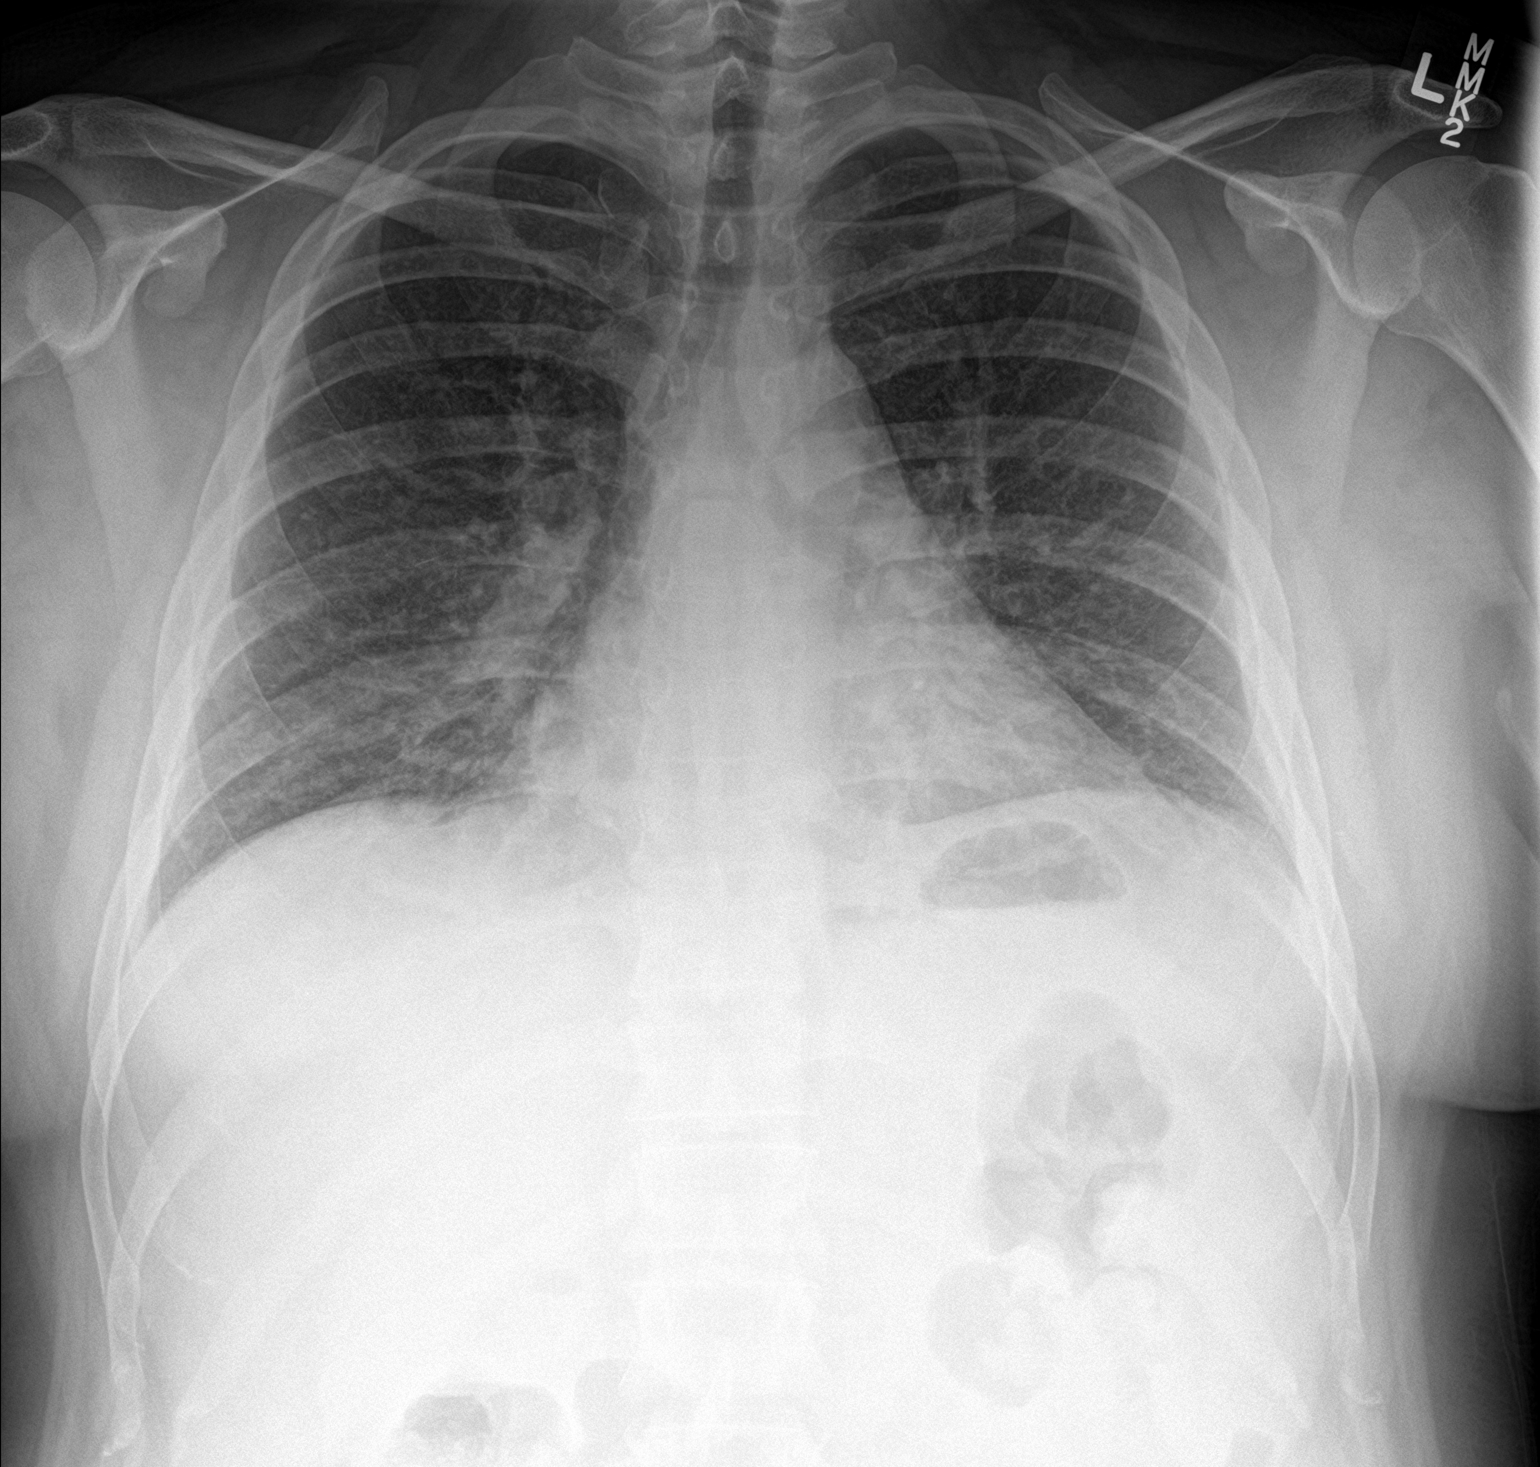

[chest lat]
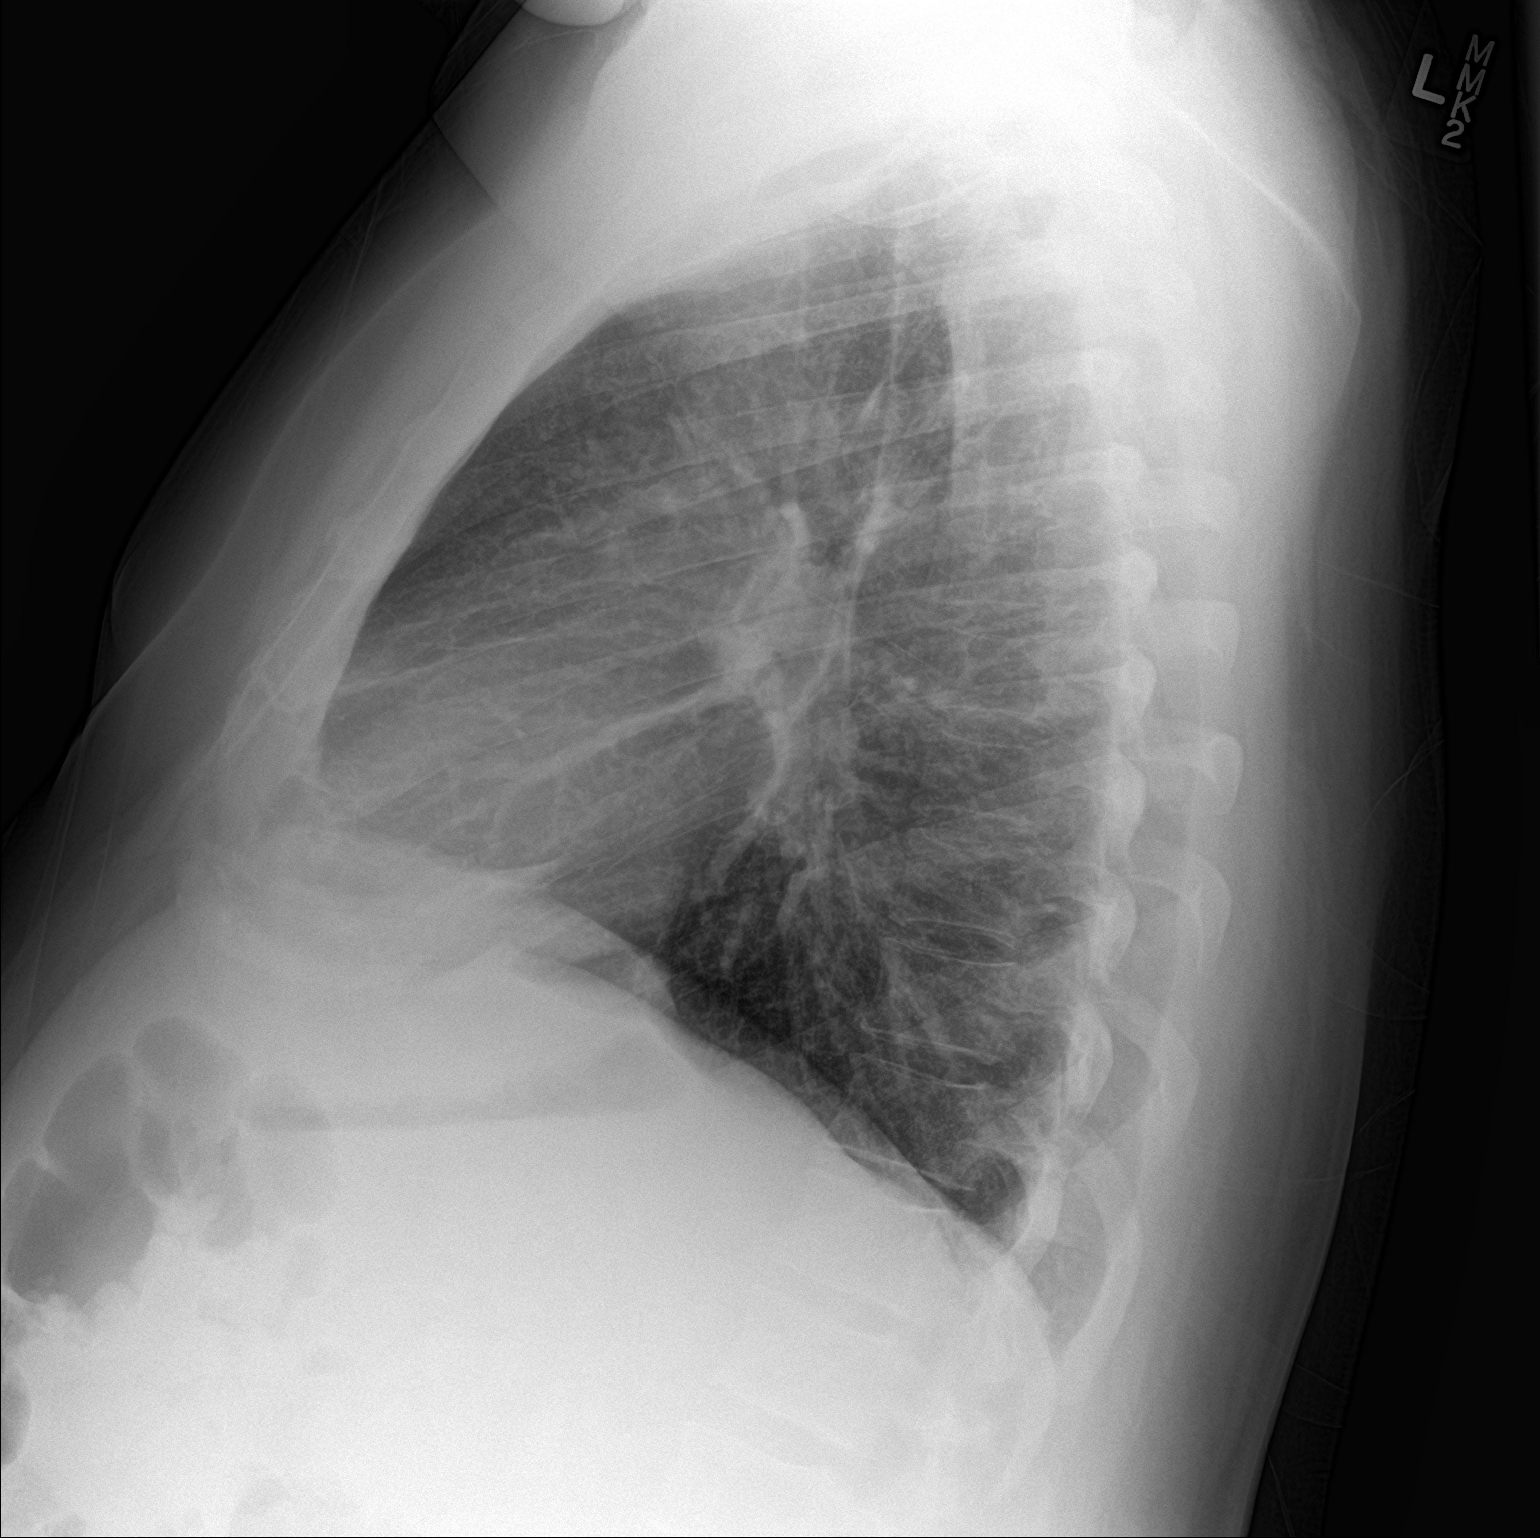

[2 of 2 positions shown; findings below may reference images not displayed]

FINDINGS: Stable normal cardiac silhouette. Bones are unremarkable. No pleural
effusion or pneumothorax. Bronchitic changes greatest in the lung
bases. No focal consolidation.
IMPRESSION: Bronchitic changes greatest in the lung bases. No focal
consolidation.

By: Lubiko Gurjidze M.D.

## 2020-07-21 ENCOUNTER — Emergency Department (HOSPITAL_COMMUNITY)
Admission: EM | Admit: 2020-07-21 | Discharge: 2020-07-21 | Disposition: A | Discharge status: Home / Self Care | Payer: Self-pay | Attending: Emergency Medicine | Admitting: Emergency Medicine

## 2020-07-21 DIAGNOSIS — R739 Hyperglycemia, unspecified: Secondary | ICD-10-CM

## 2020-07-21 DIAGNOSIS — F17210 Nicotine dependence, cigarettes, uncomplicated: Secondary | ICD-10-CM | POA: Insufficient documentation

## 2020-07-21 DIAGNOSIS — E1165 Type 2 diabetes mellitus with hyperglycemia: Secondary | ICD-10-CM | POA: Insufficient documentation

## 2020-07-21 DIAGNOSIS — Z20822 Contact with and (suspected) exposure to covid-19: Secondary | ICD-10-CM | POA: Insufficient documentation

## 2020-07-21 DIAGNOSIS — Z7984 Long term (current) use of oral hypoglycemic drugs: Secondary | ICD-10-CM | POA: Insufficient documentation

## 2020-07-21 DIAGNOSIS — E119 Type 2 diabetes mellitus without complications: Secondary | ICD-10-CM

## 2020-07-21 LAB — BASIC METABOLIC PANEL
Anion gap: 15 (ref 5–15)
BUN: 12 mg/dL (ref 6–20)
CO2: 29 mmol/L (ref 22–32)
Calcium: 9.2 mg/dL (ref 8.9–10.3)
Chloride: 84 mmol/L — ABNORMAL LOW (ref 98–111)
Creatinine, Ser: 1.01 mg/dL (ref 0.61–1.24)
GFR, Estimated: >60 mL/min (ref >60)
Glucose, Bld: 762 mg/dL (ref 70–99)
Potassium: 4.9 mmol/L (ref 3.5–5.1)
Sodium: 128 mmol/L — ABNORMAL LOW (ref 135–145)

## 2020-07-21 LAB — CBG MONITORING, ED
Glucose-Capillary: 523 mg/dL (ref 70–99)
Glucose-Capillary: 466 mg/dL — ABNORMAL HIGH (ref 70–99)
Glucose-Capillary: 273 mg/dL — ABNORMAL HIGH (ref 70–99)
Glucose-Capillary: 363 mg/dL — ABNORMAL HIGH (ref 70–99)

## 2020-07-21 LAB — HEMOGLOBIN A1C
Hgb A1c MFr Bld: 13.3 % — ABNORMAL HIGH (ref 4.8–5.6)
Mean Plasma Glucose: 335.01 mg/dL

## 2020-07-21 LAB — URINALYSIS, ROUTINE W REFLEX MICROSCOPIC
Bacteria, UA: NONE SEEN
Bilirubin Urine: NEGATIVE
Glucose, UA: >=500 mg/dL — AB
Hgb urine dipstick: NEGATIVE
Ketones, ur: NEGATIVE mg/dL
Leukocytes,Ua: NEGATIVE
Nitrite: NEGATIVE
Protein, ur: NEGATIVE mg/dL
Specific Gravity, Urine: 1.029 (ref 1.005–1.030)
pH: 6 (ref 5.0–8.0)

## 2020-07-21 LAB — CBC
HCT: 51.4 % (ref 39.0–52.0)
Hemoglobin: 17.6 g/dL — ABNORMAL HIGH (ref 13.0–17.0)
MCH: 30.1 pg (ref 26.0–34.0)
MCHC: 34.2 g/dL (ref 30.0–36.0)
MCV: 87.9 fL (ref 80.0–100.0)
Platelets: 189 10*3/uL (ref 150–400)
RBC: 5.85 MIL/uL — ABNORMAL HIGH (ref 4.22–5.81)
RDW: 13.4 % (ref 11.5–15.5)
WBC: 10.7 10*3/uL — ABNORMAL HIGH (ref 4.0–10.5)
nRBC: 0 % (ref 0.0–0.2)

## 2020-07-21 LAB — BETA-HYDROXYBUTYRIC ACID: Beta-Hydroxybutyric Acid: 0.27 mmol/L (ref 0.05–0.27)

## 2020-07-21 LAB — POC SARS CORONAVIRUS 2 AG -  ED: SARS Coronavirus 2 Ag: NEGATIVE

## 2020-07-21 MED ORDER — GLIPIZIDE 5 MG PO TABS: 5.0000 mg | ORAL_TABLET | Freq: Two times a day (BID) | ORAL | 2 refills | Status: DC

## 2020-07-21 MED ORDER — DEXTROSE IN LACTATED RINGERS 5 % IV SOLN: INTRAVENOUS | Status: DC

## 2020-07-21 MED ORDER — LACTATED RINGERS IV SOLN: INTRAVENOUS | Status: DC

## 2020-07-21 MED ORDER — METFORMIN HCL 1000 MG PO TABS: 1000.0000 mg | ORAL_TABLET | Freq: Two times a day (BID) | ORAL | 2 refills | Status: DC

## 2020-07-21 MED ORDER — DEXTROSE 50 % IV SOLN: 0.0000 mL | INTRAVENOUS | Status: DC | PRN

## 2020-07-21 MED ORDER — INSULIN REGULAR(HUMAN) IN NACL 100-0.9 UT/100ML-% IV SOLN
INTRAVENOUS | Status: DC
  Administered 2020-07-21: 19 [IU]/h via INTRAVENOUS
  Filled 2020-07-21: qty 100

## 2020-07-21 MED ORDER — SODIUM CHLORIDE 0.9 % IV BOLUS
2000.0000 mL | Freq: Once | INTRAVENOUS | Status: AC
  Administered 2020-07-21: 2000 mL via INTRAVENOUS

## 2020-07-21 NOTE — ED Provider Notes (Signed)
[facility] EMERGENCY DEPARTMENT Provider Note   CSN: [id number] Arrival date & time: 07/21/20  1507     History Chief Complaint  Patient presents with  . Hyperglycemia    [name] is a 40 y.o. male with a history of opioid dependency now on methadone presenting to emergency department with high blood sugar.  The patient reports that he was told his sugars have been high for the past year when checking in at the methadone clinic.  He states for the past 2 to 3 weeks, he has had polyuria and polydipsia.  He says he drinks a combination of water, soda, Gatorade.  He has been urinating all the time.  He reports he typically tends to eat pasta, sandwiches and bread.  He tries to avoid candy.  He has no prior known history of diabetes.  He does not take any medicine for his high blood sugar.  He denies any fevers, chills, diarrhea, chest pain, shortness of breath.  He has significant psoriasis with diffuse body rash.  He was seen by dermatology for this recently, however they would not start him on steroids due to his hyperglycemia.  HPI     Past Medical History:  Diagnosis Date  . Abscess   . GERD (gastroesophageal reflux disease)   . Heroin addiction [facility])     Patient Active Problem List   Diagnosis Date Noted  . Cellulitis of left arm 03/25/2017  . Bacteremia 03/25/2017  . IV drug abuse ([place]) 03/25/2017  . Hyponatremia 03/25/2017  . Abscess of arm, left   . Hyperprolactinemia ([place]) 03/19/2015  . 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 Opioid use disorder, moderate, on maintenance therapy ([place]) 03/17/2015  . Cannabis use disorder, moderate, in sustained remission ([place]) 03/17/2015  . Psoriasis 03/17/2015  . Aggressive behavior of adult   . Opiate dependence, continuous ([place])   . Psychoses ([place])   . Polysubstance abuse ([place]) 04/28/2014    Past Surgical History:  Procedure  Laterality Date  . I & D EXTREMITY Left 03/25/2017   Procedure: IRRIGATION AND DEBRIDEMENT LEFT ARM ABSCESS;  Surgeon: Mcarthur Rossetti, MD;  Location: [facility];  Service: Orthopedics;  Laterality: Left;       Family History  Problem Relation Age of Onset  . Anxiety disorder Mother   . Mental illness Neg Hx   . Hypertension Neg Hx     Social History   Tobacco Use  . Smoking status: Current Every Day Smoker    Packs/day: 2.00    Years: 0.00    Pack years: 0.00    Types: Cigarettes  . Smokeless tobacco: Never Used  Substance Use Topics  . Alcohol use: No    Comment: former  has not drank in 2 years   . Drug use: Yes    Types: Other-see comments, Cocaine, Marijuana, Heroin    Comment: abuses [employer], hx of cannabis abuse, bzd abuse , stimulant abuse    Home Medications Prior to Admission medications   Medication Sig Start Date End Date Taking? Authorizing Provider  glipiZIDE (GLUCOTROL) 5 MG tablet Take 1 tablet (5 mg total) by mouth 2 (two) times daily before a meal. 07/21/20 08/20/20 Yes Jerime Arif, Carola Rhine, MD  metFORMIN (GLUCOPHAGE) 1000 MG tablet Take 1 tablet (1,000 mg total) by mouth 2 (two) times daily with a meal. 07/21/20 08/20/20 Yes Annelise Mccoy, Carola Rhine, MD  atorvastatin (LIPITOR) 40 MG tablet Take 40 mg by mouth daily.  [provider]  bisacodyl (DULCOLAX) 5 MG EC tablet Take 1 tablet (5 mg total) by mouth daily as needed for moderate constipation. 03/27/17   Debbe Odea, MD  cholecalciferol (VITAMIN D) 1000 units tablet Take 1,000 Units by mouth daily.    [provider]  famotidine (PEPCID) 20 MG tablet Take 1 tablet (20 mg total) by mouth 2 (two) times daily as needed for heartburn or indigestion. 03/27/17   Debbe Odea, MD  oxyCODONE-acetaminophen (PERCOCET) 5-325 MG tablet Take 1 tablet by mouth every 4 (four) hours as needed for moderate pain. 38/32/91   Delora Fuel, MD  QUEtiapine (SEROQUEL) 300 MG tablet Take 300 mg by mouth at bedtime.     [provider]  senna-docusate (SENOKOT-S) 8.6-50 MG tablet Take 1 tablet by mouth at bedtime as needed for mild constipation. 03/27/17   Debbe Odea, MD    Allergies    Patient has no known allergies.  Review of Systems   Review of Systems  Constitutional: Negative for chills and fever.  HENT: Negative for ear pain and sore throat.   Eyes: Negative for pain and visual disturbance.  Respiratory: Negative for cough and shortness of breath.   Cardiovascular: Negative for chest pain and palpitations.  Gastrointestinal: Negative for abdominal pain and vomiting.  Endocrine: Positive for polydipsia and polyuria.  Musculoskeletal: Negative for arthralgias and back pain.  Skin: Positive for rash. Negative for wound.  Neurological: Negative for seizures and syncope.  All other systems reviewed and are negative.   Physical Exam Updated Vital Signs BP 130/77   Pulse 94   Temp 98 F (36.7 C) (Oral)   Resp 14   SpO2 95%   Physical Exam Constitutional:      General: He is not in acute distress.    Appearance: He is obese.  HENT:     Head: Normocephalic and atraumatic.  Eyes:     Conjunctiva/sclera: Conjunctivae normal.     Pupils: Pupils are equal, round, and reactive to light.  Cardiovascular:     Rate and Rhythm: Normal rate and regular rhythm.  Pulmonary:     Effort: Pulmonary effort is normal. No respiratory distress.  Abdominal:     General: There is no distension.     Tenderness: There is no abdominal tenderness.  Skin:    General: Skin is warm and dry.     Comments: Raised circular red plaques distributed across body  Neurological:     General: No focal deficit present.     Mental Status: He is alert. Mental status is at baseline.  Psychiatric:        Mood and Affect: Mood normal.        Behavior: Behavior normal.     ED Results / Procedures / Treatments   Labs (all labs ordered are listed, but only abnormal results are displayed) Labs Reviewed   BASIC METABOLIC PANEL - Abnormal; Notable for the following components:      Result Value   Sodium 128 (*)    Chloride 84 (*)    Glucose, Bld 762 (*)    All other components within normal limits  CBC - Abnormal; Notable for the following components:   WBC 10.7 (*)    RBC 5.85 (*)    Hemoglobin 17.6 (*)    All other components within normal limits  URINALYSIS, ROUTINE W REFLEX MICROSCOPIC - Abnormal; Notable for the following components:   Color, Urine STRAW (*)    Glucose, UA >=500 (*)  All other components within normal limits  HEMOGLOBIN A1C - Abnormal; Notable for the following components:   Hgb A1c MFr Bld 13.3 (*)    All other components within normal limits  CBG MONITORING, ED - Abnormal; Notable for the following components:   Glucose-Capillary 523 (*)    All other components within normal limits  CBG MONITORING, ED - Abnormal; Notable for the following components:   Glucose-Capillary 466 (*)    All other components within normal limits  CBG MONITORING, ED - Abnormal; Notable for the following components:   Glucose-Capillary 363 (*)    All other components within normal limits  CBG MONITORING, ED - Abnormal; Notable for the following components:   Glucose-Capillary 273 (*)    All other components within normal limits  BETA-HYDROXYBUTYRIC ACID  POC SARS CORONAVIRUS 2 AG -  ED    EKG None  Radiology No results found.  Procedures Procedures   Medications Ordered in ED Medications  insulin regular, human (MYXREDLIN) 100 units/ 100 mL infusion (10 Units/hr Intravenous Rate/Dose Change 07/21/20 2145)  dextrose 50 % solution 0-50 mL (has no administration in time range)  sodium chloride 0.9 % bolus 2,000 mL (2,000 mLs Intravenous New Bag/Given 07/21/20 2039)    ED Course  I have reviewed the triage vital signs and the nursing notes.  Pertinent labs & imaging results that were available during my care of the patient were reviewed by me and considered in my  medical decision making (see chart for details).   This is a 40 year old male presenting to emergency department with hyperglycemia, polyuria and polydipsia.  Very likely new onset diabetes.  I suspect this is triggered by his poor diet.  I reviewed his labs which showed Glucose 762 on arrival, anion gap 15, pseudohyponatremia (suspected) with Na 128, K 4.9.  CBC unremarkable.  UA with glucose, no ketones.  Doubt this is DKA.   We'll give 2 L IVF, and then start on insulin to lower his sugars.  I'll send an a1c level.  If his symptoms are better controlled, he tells me he can drink water easily at home.  We can potentially start him on metformin and/or glipizide.  He may need TOC involvement for PCP establishment  Clinical Course as of 07/21/20 2222  Wed Jul 21, 2020  2113 HA1C [MT]  2114 A1c 13.3.  He likely needs insulin, but I am concerned about significant barriers to education and self-administration at home, even with diabetes education at the bedside.  Alternatively I've talked to him at length about starting metformin and glipizide and making dietary changes.  He would strongly prefer this.  He also is concerned about affording insulin.  I'll have Mariann Laster our CM talk to him about f/u in our wellness clinic and buying a glucometer starting kit, which apparently is $20 at [facility] or local pharmacies.  He can afford this.  I'll provide information about diabetes eating plan as well in his discharge. [MT]  2115 CM has discussed plan with patient and will ensure he has a follow up appointment at wellness clinic, where they also have diabetic educator [MT]  2216 He's feeling well, finished fluids and drinking, okay for discharge. [MT]    Clinical Course User Index [MT] Alyda Megna, Carola Rhine, MD    Final Clinical Impression(s) / ED Diagnoses Final diagnoses:  Hyperglycemia  New onset type 2 diabetes mellitus ([place])    Rx / DC Orders ED Discharge Orders         Ordered  metFORMIN (GLUCOPHAGE)  1000 MG tablet  2 times daily with meals        07/21/20 2124    glipiZIDE (GLUCOTROL) 5 MG tablet  2 times daily before meals        07/21/20 2124           Wyvonnia Dusky, MD 07/21/20 2222

## 2020-07-21 NOTE — Discharge Instructions (Addendum)
You were diagnosed with diabetes today.  For the next 2 weeks, you need to drink LOTS of water.  Your body needs water.    You should NOT drink soda, sweat tea, or sugar drinks.  These will dehydrate your body and make your sugars high.  We started you on two pills for your diabetes.  You will take them each in the morning and at night.  Pick these up at the pharmacy tomorrow and start taking them.  You will need to follow up in our wellness clinic.  Diabetes is complicated and needs close medical management.  Please make sure you schedule an appointment with them.  You will also need to buy a glucometer and testing strips tomorrow.  Burna Mortimer talked to you about this.  You can buy them in most [employer].  Check your sugar twice a day - when you wake up, and before bedtime.  Write down your sugar levels.  Your goal blood sugar is between 100-200.  If your sugar is above 500, you need to go to an urgent care or come back to the ER for insulin.  If your blood sugar is less than 60, that is too low - you should eat some small snack, cracker, candy, or sip juice right away.  Finally, take some time to read over the information I've included about a diet.  Your eating is VERY important to controlling your blood sugar.  *  ow to check your blood glucose Checking your blood glucose Wash your hands for at least 20 seconds with soap and water. Prick the side of your finger (not the tip) with the lancet. Do not use the same finger consecutively.  Do not use the same lancet twice. Gently rub the finger until a small drop of blood appears. Follow instructions that come with your meter for inserting the test strip, applying blood to the strip, and using your blood glucose meter. Write down your result and any notes in your log.

## 2020-07-21 NOTE — Progress Notes (Signed)
   07/21/20 2154  TOC ED Mini Assessment  TOC Time spent with patient (minutes): 15  PING Used in TOC Assessment No  Admission or Readmission Diverted Yes  Interventions which prevented an admission or readmission PCP Appointment Scheduled  What brought you to the Emergency Department?  hyperglycemia  Barriers to Discharge No Barriers Identified  Barrier interventions ED EDRN consulted concerning patient needing PCP follow up for management of diabetes.  ED  RNCM met with patient at bedside to discuss medication needs and follow up care at the [facility] based on the services offered patient is agreeable.  RNCM spoke with EDP concerning patient's prescription and glucometer.  Patient states, he recently received this new drug plan and is not sure if it will cover his prescriptions.  Patient will also be provided good rx coupons for prescription.  ED RN CM also advise to purchase a generic glucometer kit for $20 patient states that is affordable. RNCM explained that I will forward his information to [facility]  Updated EDP and information forwarded to [facility] 

## 2020-07-21 NOTE — ED Notes (Signed)
Pt called for vitals and registration with no answer. Pt's belongings seen in ED. Will call again in a few minutes.

## 2020-07-21 NOTE — ED Triage Notes (Signed)
Pt arrives to ED with chief complaint of polyuria & polydipsia- had blood drawn yesterday was told to come to ER due to elevated blood sugar.

## 2020-07-22 ENCOUNTER — Telehealth: Payer: Self-pay

## 2020-07-22 NOTE — Telephone Encounter (Signed)
Message received from Michel Bickers, RN CM requesting an appointment for patient at [facility]. Call placed to patient # [phone], to schedule the appointment, message left with call back requested to this CM. The patient also has the phone number for [facility] on his ED AVS.

## 2020-07-26 NOTE — Telephone Encounter (Signed)
Attempted to contact the patient again to schedule a follow up appointment.  Message left with call back requested to this CM.[name] letter also sent to patient requesting he contact [facility] or [facility] to schedule this appointment,

## 2020-08-09 ENCOUNTER — Inpatient Hospital Stay (INDEPENDENT_AMBULATORY_CARE_PROVIDER_SITE_OTHER): Payer: Self-pay | Admitting: Primary Care

## 2020-08-13 ENCOUNTER — Ambulatory Visit (INDEPENDENT_AMBULATORY_CARE_PROVIDER_SITE_OTHER): Payer: 59 | Admitting: Primary Care

## 2020-08-13 ENCOUNTER — Other Ambulatory Visit: Payer: Self-pay

## 2020-08-13 ENCOUNTER — Encounter (INDEPENDENT_AMBULATORY_CARE_PROVIDER_SITE_OTHER): Payer: Self-pay | Admitting: Primary Care

## 2020-08-13 VITALS — BP 114/77 | HR 104 | Temp 97.5°F | Ht 71.0 in | Wt 298.8 lb

## 2020-08-13 DIAGNOSIS — E1165 Type 2 diabetes mellitus with hyperglycemia: Secondary | ICD-10-CM

## 2020-08-13 DIAGNOSIS — Z09 Encounter for follow-up examination after completed treatment for conditions other than malignant neoplasm: Secondary | ICD-10-CM | POA: Diagnosis not present

## 2020-08-13 MED ORDER — METFORMIN HCL 1000 MG PO TABS: 1000.0000 mg | ORAL_TABLET | Freq: Two times a day (BID) | ORAL | 1 refills | Status: DC

## 2020-08-13 MED ORDER — GLIPIZIDE 10 MG PO TABS: 10.0000 mg | ORAL_TABLET | Freq: Two times a day (BID) | ORAL | 2 refills | Status: DC

## 2020-08-13 NOTE — Patient Instructions (Signed)
Diabetes Mellitus Action Plan Following a diabetes action plan is a way for you to manage your diabetes (diabetes mellitus) symptoms. The plan is color-coded to help you understand what actions you need to take based on any symptoms you are having.  If you have symptoms in the red zone, you need medical care right away.  If you have symptoms in the yellow zone, you are having problems.  If you have symptoms in the green zone, you are doing well. Learning about and understanding diabetes can take time. Follow the plan that you develop with your health care provider. Know the target range for your blood sugar (glucose) level, and review your treatment plan with your health care provider at each visit. The target range for my blood sugar level is __________________________ mg/dL. Red zone Get medical help right away if you have any of the following symptoms:  A blood sugar test result that is below 54 mg/dL (3 mmol/L).  A blood sugar test result that is at or above 240 mg/dL (13.3 mmol/L) for 2 days in a row.  Confusion or trouble thinking clearly.  Difficulty breathing.  Sickness or a fever for 2 or more days that is not getting better.  Moderate or large ketone levels in your urine.  Feeling tired or having no energy. If you have any red zone symptoms, do not wait to see if the symptoms will go away. Get medical help right away. Call your local emergency services (911 in the [place] Do not drive yourself to the hospital. If you have severely low blood sugar (severe hypoglycemia) and you cannot eat or drink, you may need glucagon. Make sure a family member or close friend knows how to check your blood sugar and how to give you glucagon. You may need to be treated in a hospital for this condition.   Yellow zone If you have any of the following symptoms, your diabetes is not under control and you may need to make some changes:  A blood sugar test result that is at or above 240 mg/dL (13.3  mmol/L) for 2 days in a row.  Blood sugar test results that are below 70 mg/dL (3.9 mmol/L).  Other symptoms of hypoglycemia, such as: ? Shaking or feeling light-headed. ? Confusion or irritability. ? Feeling hungry. ? Having a fast heartbeat. If you have any yellow zone symptoms:  Treat your hypoglycemia by eating or drinking 15 grams of a rapid-acting carbohydrate. Follow the 15:15 rule: ? Take 15 grams of a rapid-acting carbohydrate, such as:  1 tube of glucose gel.  4 glucose pills.  4 oz (120 mL) of fruit juice.  4 oz (120 mL) of regular (not diet) soda. ? Check your blood sugar 15 minutes after you take the carbohydrate. ? If the repeat blood sugar test is still at or below 70 mg/dL (3.9 mmol/L), take 15 grams of a carbohydrate again. ? If your blood sugar does not increase above 70 mg/dL (3.9 mmol/L) after 3 tries, get medical help right away. ? After your blood sugar returns to normal, eat a meal or a snack within 1 hour.  Keep taking your daily medicines as told by your health care provider.  Check your blood sugar more often than you normally would. ? Write down your results. ? Call your health care provider if you have trouble keeping your blood sugar in your target range.   Green zone These signs mean you are doing well and you can continue what you   are doing to manage your diabetes:  Your blood sugar is within your personal target range. For most people, a blood sugar level before a meal (preprandial) should be 80-130 mg/dL (4.4-7.2 mmol/L).  You feel well, and you are able to do daily activities. If you are in the green zone, continue to manage your diabetes as told by your health care provider. To do this:  Eat a healthy diet.  Exercise regularly.  Check your blood sugar as told by your health care provider.  Take your medicines as told by your health care provider.   Where to find more information  American Diabetes Association (ADA):  diabetes.org  Association of Diabetes Care & Education Specialists (ADCES): diabeteseducator.org Summary  Following a diabetes action plan is a way for you to manage your diabetes symptoms. The plan is color-coded to help you understand what actions you need to take based on any symptoms you are having.  Follow the plan that you develop with your health care provider. Make sure you know your personal target blood sugar level.  Review your treatment plan with your health care provider at each visit. This information is not intended to replace advice given to you by your health care provider. Make sure you discuss any questions you have with your health care provider. Document Revised: 11/27/2019 Document Reviewed: 11/27/2019 Elsevier Patient Education  2021 [employer].  

## 2020-08-13 NOTE — Progress Notes (Signed)
[facility] Hospitalization Follow-up (DM) CC: Hospitalization Follow-up (DM)  HPI Mr. [name]. Volker is a 40 y.o.male presents for follow up from the Emergency room on 07/21/20, He states for the past 2 to 3 weeks, he has had polyuria and polydipsia.  He says he drinks a combination of water, soda, Gatorade.  He has been urinating all the time. A1C 13.3>  . Patient does check blood sugar at home  Compliant with meds - Yes Checking CBGs? Yes  Fasting avg - 115-200 Exercising regularly? - Yes Watching carbohydrate intake? - Yes Neuropathy ? - No Hypoglycemic events - No  Pertinent ROS:  Polyuria - Yes Polydipsia - No Vision problems - Yes  Medications as noted below. Taking them regularly without complication/adverse reaction being reported today.   History Muhsin has a past medical history of Abscess, GERD (gastroesophageal reflux disease), and Heroin addiction (HCC).   He has a past surgical history that includes I & D extremity (Left, 03/25/2017).   His family history includes Anxiety disorder in his mother.He reports that he has been smoking cigarettes. He has been smoking about 2.00 packs per day for the past 0.00 years. He has never used smokeless tobacco. He reports current drug use. Drugs: Other-see comments, Cocaine, Marijuana, and Heroin. He reports that he does not drink alcohol.  Current Outpatient Medications on File Prior to Visit  Medication Sig Dispense Refill  . Cholecalciferol (VITAMIN D3) 50 MCG (2000 UT) capsule TAKE ONE CAPSULE EACH DAY    . hydrochlorothiazide (HYDRODIURIL) 25 MG tablet Take 25 mg by mouth daily.    [name] LIPITOR 20 MG tablet Take 20 mg by mouth at bedtime.    [name] lisinopril (ZESTRIL) 10 MG tablet Take 10 mg by mouth daily.    [name] PROTONIX 20 MG tablet Take 20 mg by mouth daily.    . QUEtiapine (SEROQUEL) 300 MG tablet Take 300 mg by mouth at bedtime.    . triamcinolone (KENALOG) 0.1 % APPLY TWICE DAILY FOR 14 DAYS    .  VISTARIL 50 MG capsule Take 50 mg by mouth 3 (three) times daily as needed.     No current facility-administered medications on file prior to visit.    ROS Review of Systems Pertinent positive and negative noted in HPI Objective:  BP 114/77 (BP Location: Right Arm, Patient Position: Sitting, Cuff Size: Large)   Pulse (!) 104   Temp (!) 97.5 F (36.4 C) (Temporal)   Ht 5\' 11"  (1.803 m)   Wt 298 lb 12.8 oz (135.5 kg)   SpO2 94%   BMI 41.67 kg/m   BP Readings from Last 3 Encounters:  08/13/20 114/77  07/21/20 (!) 149/84  03/27/17 (!) 140/93    Wt Readings from Last 3 Encounters:  08/13/20 298 lb 12.8 oz (135.5 kg)  03/26/17 256 lb (116.1 kg)  03/24/17 240 lb (108.9 kg)    Physical Exam BP 114/77 (BP Location: Right Arm, Patient Position: Sitting, Cuff Size: Large)   Pulse (!) 104   Temp (!) 97.5 F (36.4 C) (Temporal)   Ht 5\' 11"  (1.803 m)   Wt 298 lb 12.8 oz (135.5 kg)   SpO2 94%   BMI 41.67 kg/m  General Appearance: Well nourished,morbid obese  in no apparent distress. Eyes: PERRLA, EOMs, conjunctiva no swelling or erythema Sinuses: No Frontal/maxillary tenderness ENT/Mouth: Ext aud canals clear, TMs without erythema, bulging.  Hearing normal.  Neck: Supple, thyroid normal.  Respiratory: Respiratory effort normal, BS equal bilaterally without rales, rhonchi,  wheezing or stridor.  Cardio: RRR with no MRGs. Brisk peripheral pulses without edema.  Abdomen: Soft, + BS.  Non tender, no guarding, rebound, hernias, masses. Lymphatics: Non tender without lymphadenopathy.  Musculoskeletal: Full ROM, 5/5 strength, normal gait.  Skin: Warm, dry without rashes, lesions, psoriasis  Neuro: Cranial nerves intact. Normal muscle tone, no cerebellar symptoms. Sensation intact.  Psych: Awake and oriented X 3, normal affect, Insight and Judgment appropriate.   Lab Results  Component Value Date   HGBA1C 13.3 (H) 07/21/2020   HGBA1C 6.2 (H) 10/07/2015   HGBA1C 5.8 (H) 03/18/2015     Lab Results  Component Value Date   WBC 10.7 (H) 07/21/2020   HGB 17.6 (H) 07/21/2020   HCT 51.4 07/21/2020   PLT 189 07/21/2020   GLUCOSE 762 (HH) 07/21/2020   CHOL 192 10/07/2015   TRIG 276 (H) 10/07/2015   HDL 26 (L) 10/07/2015   LDLCALC 111 (H) 10/07/2015   ALT 25 03/24/2017   AST 26 03/24/2017   NA 128 (L) 07/21/2020   K 4.9 07/21/2020   CL 84 (L) 07/21/2020   CREATININE 1.01 07/21/2020   BUN 12 07/21/2020   CO2 29 07/21/2020   TSH 1.730 03/18/2015   INR 1.00 03/24/2017   HGBA1C 13.3 (H) 07/21/2020     Assessment & Plan:  Gabrial was seen today for hospitalization follow-up.  Diagnoses and all orders for this visit:  Hospital discharge follow-up New dx of T2D started on oral agents in ED and f/u with PCP for management   Uncontrolled type 2 diabetes mellitus with hyperglycemia (HCC) Elevated A1C 13.1 explain BS would not be controlled on dual medication he needed to be on insulin- per ADA guidelines . Patient refuses to take insulin at this time we will revisit on f/u : Goal of therapy: Less than 6.5 hemoglobin A1c.  Encourage to decrease foods that are high in carbohydrates are the following rice, potatoes, breads, sugars, and pastas.  Reduction in the intake (eating) will assist in lowering your blood sugars. Increased glipizide to 10mg  bid  Other orders -     glipiZIDE (GLUCOTROL) 10 MG tablet; Take 1 tablet (10 mg total) by mouth 2 (two) times daily before a meal. -     metFORMIN (GLUCOPHAGE) 1000 MG tablet; Take 1 tablet (1,000 mg total) by mouth 2 (two) times daily with a meal.   I have discontinued . Salas's atorvastatin, cholecalciferol, oxyCODONE-acetaminophen, bisacodyl, famotidine, and senna-docusate. I have also changed his glipiZIDE. Additionally, I am having him maintain his QUEtiapine, Lipitor, Vitamin D3, hydrochlorothiazide, Vistaril, lisinopril, Protonix, triamcinolone, and metFORMIN.  Meds ordered this encounter  Medications  .  glipiZIDE (GLUCOTROL) 10 MG tablet    Sig: Take 1 tablet (10 mg total) by mouth 2 (two) times daily before a meal.    Dispense:  180 tablet    Refill:  2  . metFORMIN (GLUCOPHAGE) 1000 MG tablet    Sig: Take 1 tablet (1,000 mg total) by mouth 2 (two) times daily with a meal.    Dispense:  180 tablet    Refill:  1     Follow-up:   Return in about 3 months (around 11/13/2020) for t2D.  The above assessment and management plan was discussed with the patient. The patient verbalized understanding of and has agreed to the management plan. Patient is aware to call the clinic if symptoms fail to improve or worsen. Patient is aware when to return to the clinic for a follow-up visit. Patient educated  on when it is appropriate to go to the emergency department.   Gwinda Passe, NP-C

## 2020-10-18 ENCOUNTER — Ambulatory Visit (INDEPENDENT_AMBULATORY_CARE_PROVIDER_SITE_OTHER): Payer: 59 | Admitting: Primary Care

## 2020-11-10 ENCOUNTER — Other Ambulatory Visit: Payer: Self-pay

## 2020-11-10 ENCOUNTER — Ambulatory Visit (INDEPENDENT_AMBULATORY_CARE_PROVIDER_SITE_OTHER): Payer: 59 | Admitting: Primary Care

## 2020-11-10 ENCOUNTER — Encounter (INDEPENDENT_AMBULATORY_CARE_PROVIDER_SITE_OTHER): Payer: Self-pay | Admitting: Primary Care

## 2020-11-10 VITALS — BP 92/61 | HR 99 | Temp 97.5°F | Ht 71.0 in | Wt 277.0 lb

## 2020-11-10 DIAGNOSIS — F17210 Nicotine dependence, cigarettes, uncomplicated: Secondary | ICD-10-CM

## 2020-11-10 DIAGNOSIS — F1910 Other psychoactive substance abuse, uncomplicated: Secondary | ICD-10-CM

## 2020-11-10 DIAGNOSIS — I10 Essential (primary) hypertension: Secondary | ICD-10-CM | POA: Diagnosis not present

## 2020-11-10 DIAGNOSIS — E1165 Type 2 diabetes mellitus with hyperglycemia: Secondary | ICD-10-CM | POA: Diagnosis not present

## 2020-11-10 DIAGNOSIS — Z720 Tobacco use: Secondary | ICD-10-CM

## 2020-11-10 LAB — POCT GLYCOSYLATED HEMOGLOBIN (HGB A1C): Hemoglobin A1C: 6.1 % — AB (ref 4.0–5.6)

## 2020-11-10 MED ORDER — GLIPIZIDE 10 MG PO TABS: 10.0000 mg | ORAL_TABLET | Freq: Two times a day (BID) | ORAL | 2 refills | Status: AC

## 2020-11-10 MED ORDER — METFORMIN HCL ER 500 MG PO TB24: 500.0000 mg | ORAL_TABLET | Freq: Every day | ORAL | 1 refills | Status: DC

## 2020-11-10 MED ORDER — LISINOPRIL 5 MG PO TABS: 5.0000 mg | ORAL_TABLET | Freq: Every day | ORAL | 1 refills | Status: AC

## 2020-11-10 NOTE — Progress Notes (Signed)
Subjective:  Patient ID: [name], male    DOB: [birth year]  Age: 40 y.o. MRN: [id number]  CC: Diabetes   HPI [name] presents forFollow-up of diabetes. Patient does check blood sugar at home. Found out whip cream was his enemy trying to eat better but sometimes them twinkle cravings get the best of him.  Compliant with meds - Yes Checking CBGs? Yes  Fasting avg - 120    Exercising regularly? - No Watching carbohydrate intake? - Yes Neuropathy ? - Yes Hypoglycemic events - No  - Recovers with :   Pertinent ROS:  Polyuria - No Polydipsia - No Vision problems - No  Medications as noted below. Taking them regularly without complication/adverse reaction being reported today.   History [name] has a past medical history of Abscess, GERD (gastroesophageal reflux disease), and Heroin addiction ([place]).   [name] has a past surgical history that includes I & D extremity (Left, 03/25/2017).   His family history includes Anxiety disorder in his mother.[name] reports that [name] has been smoking cigarettes. [name] has been smoking about 2.00 packs per day for the past 0.00 years. [name] has never used smokeless tobacco. [name] reports current drug use. Drugs: Other-see comments, Cocaine, Marijuana, and Heroin. [name] reports that [name] does not drink alcohol.  Current Outpatient Medications on File Prior to Visit  Medication Sig Dispense Refill  . Cholecalciferol (VITAMIN D3) 50 MCG (2000 UT) capsule TAKE ONE CAPSULE EACH DAY    . LIPITOR 20 MG tablet Take 20 mg by mouth at bedtime.    [name] PROTONIX 20 MG tablet Take 20 mg by mouth daily.    . QUEtiapine (SEROQUEL) 300 MG tablet Take 300 mg by mouth at bedtime.    . triamcinolone (KENALOG) 0.1 % APPLY TWICE DAILY FOR 14 DAYS    . VISTARIL 50 MG capsule Take 50 mg by mouth 3 (three) times daily as needed.     No current facility-administered medications on file prior to visit.    ROS Review of Systems  Endocrine: Positive for polyphagia.  Genitourinary:        Sometime urine stream stops and has to pressed to allow the rest of the urine out  Psychiatric/Behavioral: Positive for behavioral problems.       Manage out side of practice   All other systems reviewed and are negative.   Objective:  BP 92/61 (BP Location: Left Arm, Patient Position: Sitting, Cuff Size: Large)   Pulse 99   Temp (!) 97.5 F (36.4 C) (Temporal)   Ht 5' 11"  (1.803 m)   Wt 277 lb (125.6 kg)   SpO2 97%   BMI 38.63 kg/m   BP Readings from Last 3 Encounters:  11/10/20 92/61  08/13/20 114/77  07/21/20 (!) 149/84    Wt Readings from Last 3 Encounters:  11/10/20 277 lb (125.6 kg)  08/13/20 298 lb 12.8 oz (135.5 kg)  03/26/17 256 lb (116.1 kg)    Physical Exam Vitals reviewed.  Constitutional:      Appearance: [name] is obese.     Comments: morbid  HENT:     Right Ear: External ear normal.     Left Ear: External ear normal.     Nose: Nose normal.  Eyes:     Extraocular Movements: Extraocular movements intact.  Cardiovascular:     Rate and Rhythm: Normal rate and regular rhythm.  Pulmonary:     Effort: Pulmonary effort is normal.     Breath sounds: Normal breath sounds.  Abdominal:     General: Bowel sounds are normal. There is distension.     Palpations: Abdomen is soft.  Musculoskeletal:        General: Normal range of motion.     Cervical back: Normal range of motion and neck supple.  Skin:    General: Skin is warm and dry.     Findings: Bruising present.     Comments: Psoriasis   Neurological:     Mental Status: [name] is oriented to person, place, and time.  Psychiatric:        Mood and Affect: Mood normal.     Lab Results  Component Value Date   HGBA1C 6.1 (A) 11/10/2020   HGBA1C 13.3 (H) 07/21/2020   HGBA1C 6.2 (H) 10/07/2015    Lab Results  Component Value Date   WBC 10.7 (H) 07/21/2020   HGB 17.6 (H) 07/21/2020   HCT 51.4 07/21/2020   PLT 189 07/21/2020   GLUCOSE 762 (HH) 07/21/2020   CHOL 192 10/07/2015   TRIG 276 (H)  10/07/2015   HDL 26 (L) 10/07/2015   LDLCALC 111 (H) 10/07/2015   ALT 25 03/24/2017   AST 26 03/24/2017   NA 128 (L) 07/21/2020   K 4.9 07/21/2020   CL 84 (L) 07/21/2020   CREATININE 1.01 07/21/2020   BUN 12 07/21/2020   CO2 29 07/21/2020   TSH 1.730 03/18/2015   INR 1.00 03/24/2017   HGBA1C 6.1 (A) 11/10/2020     Assessment & Plan:   [name] was seen today for diabetes.  Diagnoses and all orders for this visit: Uncontrolled type 2 diabetes mellitus with hyperglycemia (HCC) -     HgB A1c 6.1 today from 13.3 3 months a day. : Goal of therapy met : Less than 6.5 hemoglobin A1c. Continue to monitor foods that are high in carbohydrates are the following rice, potatoes, breads, sugars, and pastas.  Reduction in the intake (eating) will assist in lowering your blood sugars. Discontinued metformin 162m bid to metformin 504mXR in AM after bkf and continie glipizide 1081mid.  Essential hypertension Hypotensive today d/c HCTZ and lisinopril 4m59m92/61 Bp seen at Methadone clinic earlier today. Lisinopril reduce to 10mg18m return for Bp ck.   Tobacco abuse Methadone clinic prescribe gum and patches [name] has wean his self down from 2 ppd to 1/2 ppd. Each visit discuss cessation and encourage to use gum and or patches   Polysubstance abuse (HCC) [place] meth[facility]one clinic    I have discontinued [name]'s hydrochlorothiazide, lisinopril, and metFORMIN. I am also having him start on lisinopril and metFORMIN. Additionally, I am having him maintain his QUEtiapine, Lipitor, Vitamin D3, Vistaril, Protonix, triamcinolone cream, and glipiZIDE.  Meds ordered this encounter  Medications  . lisinopril (ZESTRIL) 5 MG tablet    Sig: Take 1 tablet (5 mg total) by mouth daily.    Dispense:  30 tablet    Refill:  1  . glipiZIDE (GLUCOTROL) 10 MG tablet    Sig: Take 1 tablet (10 mg total) by mouth 2 (two) times daily before a meal.    Dispense:  180 tablet    Refill:  2  . metFORMIN  (GLUCOPHAGE XR) 500 MG 24 hr tablet    Sig: Take 1 tablet (500 mg total) by mouth daily with breakfast.    Dispense:  90 tablet    Refill:  1     Follow-up:   Return in about 6 weeks (around 12/22/2020) for Bp  f/u.  The above assessment and management plan was discussed with the patient. The patient verbalized understanding of and has agreed to the management plan. Patient is aware to call the clinic if symptoms fail to improve or worsen. Patient is aware when to return to the clinic for a follow-up visit. Patient educated on when it is appropriate to go to the emergency department.   Juluis Mire, NP-C

## 2020-12-22 ENCOUNTER — Ambulatory Visit (INDEPENDENT_AMBULATORY_CARE_PROVIDER_SITE_OTHER): Payer: 59 | Admitting: Primary Care

## 2021-01-03 ENCOUNTER — Ambulatory Visit (INDEPENDENT_AMBULATORY_CARE_PROVIDER_SITE_OTHER): Payer: 59 | Admitting: Primary Care

## 2022-07-17 DIAGNOSIS — F1120 Opioid dependence, uncomplicated: Secondary | ICD-10-CM | POA: Diagnosis not present

## 2022-07-18 ENCOUNTER — Telehealth: Payer: Self-pay

## 2022-07-18 ENCOUNTER — Other Ambulatory Visit (HOSPITAL_COMMUNITY): Payer: Self-pay

## 2022-07-18 NOTE — Telephone Encounter (Signed)
[facility] Patient Teacher, English as a foreign language completed.    The patient is insured through [employer].  Medication will need a PA.  We will continue to follow to see if copay assistance is needed.  [name], [facility] Patient [facility] for Infectious Disease Phone: [phone] Fax:  [phone]

## 2022-07-19 ENCOUNTER — Encounter: Payer: Self-pay | Admitting: Internal Medicine

## 2022-07-25 DIAGNOSIS — F1120 Opioid dependence, uncomplicated: Secondary | ICD-10-CM | POA: Diagnosis not present

## 2022-08-15 DIAGNOSIS — F1120 Opioid dependence, uncomplicated: Secondary | ICD-10-CM | POA: Diagnosis not present

## 2022-08-16 ENCOUNTER — Encounter: Payer: Self-pay | Admitting: Internal Medicine

## 2022-08-16 ENCOUNTER — Other Ambulatory Visit (HOSPITAL_COMMUNITY): Payer: Self-pay

## 2022-08-16 ENCOUNTER — Ambulatory Visit (INDEPENDENT_AMBULATORY_CARE_PROVIDER_SITE_OTHER): Payer: 59 | Admitting: Internal Medicine

## 2022-08-16 ENCOUNTER — Other Ambulatory Visit: Payer: Self-pay

## 2022-08-16 VITALS — BP 129/91 | HR 113 | Resp 16 | Ht 71.0 in | Wt 258.0 lb

## 2022-08-16 DIAGNOSIS — B182 Chronic viral hepatitis C: Secondary | ICD-10-CM | POA: Diagnosis not present

## 2022-08-16 NOTE — Progress Notes (Signed)
[facility] for Infectious Disease      Reason for Consult: chronic hepatitis C    Referring Physician:     Patient ID: [name], male    DOB: [birth year], 42 y.o.   MRN: [id number]  HPI:   [name] is here for evaluation of known chronic hepatitis C by his report.   There are no records available to me from previous labs or visits.   He reports finding out about hepatitis C about 1 year ago. He has a history of IVDU, now clean over 2 years. He has never sought treatment.  He has a history of psoriasis, uncontrolled and is required to get hepatitis C treatment prior to any treatment for psoriasis.    Past Medical History:  Diagnosis Date   Abscess    GERD (gastroesophageal reflux disease)    Heroin addiction ([place])     Prior to Admission medications   Medication Sig Start Date End Date Taking? Authorizing Provider  Cholecalciferol (VITAMIN D3) 50 MCG (2000 UT) capsule TAKE ONE CAPSULE EACH DAY 07/13/20  Yes [provider]  glipiZIDE (GLUCOTROL) 10 MG tablet Take 1 tablet (10 mg total) by mouth 2 (two) times daily before a meal. 11/10/20 08/16/22 Yes Edwards, Michelle P, NP  LIPITOR 20 MG tablet Take 20 mg by mouth at bedtime. 07/13/20  Yes [provider]  metFORMIN (GLUCOPHAGE XR) 500 MG 24 hr tablet Take 1 tablet (500 mg total) by mouth daily with breakfast. 11/10/20  Yes Kerin Perna, NP  PROTONIX 20 MG tablet Take 20 mg by mouth daily. 07/13/20  Yes [provider]  QUEtiapine (SEROQUEL) 300 MG tablet Take 300 mg by mouth at bedtime.   Yes [provider]  VISTARIL 50 MG capsule Take 50 mg by mouth 3 (three) times daily as needed. 07/13/20  Yes [provider]  lisinopril (ZESTRIL) 5 MG tablet Take 1 tablet (5 mg total) by mouth daily. 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 [id number]   Drug use: Not Currently    Types: Other-see comments, Cocaine, Marijuana, Heroin    Comment: clean x 1 year as of 2024 on Methadone    Family History  Problem Relation Age of Onset   Anxiety disorder Mother    Mental illness Neg Hx    Hypertension Neg Hx     Review of Systems  Constitutional: negative for fatigue, malaise, and weight loss All other systems reviewed and are negative    Constitutional: in no apparent distress  Vitals:   08/16/22 1044  Resp: 16   HENT: poor dentition EYES: anicteric Respiratory: normal respiratory effort Skin: diffuse psoriasis Labs: Lab Results  Component Value Date   WBC 10.7 (H) 07/21/2020   HGB 17.6 (H) 07/21/2020   HCT 51.4 07/21/2020   MCV 87.9 07/21/2020   PLT 189 07/21/2020  Lab Results  Component Value Date   CREATININE 1.01 07/21/2020   BUN 12 07/21/2020   NA 128 (L) 07/21/2020   K 4.9 07/21/2020   CL 84 (L) 07/21/2020   CO2 29 07/21/2020    Lab Results  Component Value Date   ALT 25 03/24/2017   AST 26 03/24/2017   ALKPHOS 85 03/24/2017   BILITOT 0.9 03/24/2017   INR 1.00 03/24/2017     Assessment: probable chronic hepatitis C.  I discussed treatment options with him, side effects, benefits of treatment and possibility of reinfection.  He is interested in treatment.   Plan: 1)  will do appropriate labs and ultrasound then consider treatment options once confirmed. He will return about 4 weeks after starting medication

## 2022-08-19 LAB — CBC WITH DIFFERENTIAL/PLATELET
Absolute Monocytes: 507 cells/uL (ref 200–950)
Basophils Absolute: 78 cells/uL (ref 0–200)
Basophils Relative: 0.6 %
Eosinophils Absolute: 273 cells/uL (ref 15–500)
Eosinophils Relative: 2.1 %
HCT: 51.8 % — ABNORMAL HIGH (ref 38.5–50.0)
Hemoglobin: 17.4 g/dL — ABNORMAL HIGH (ref 13.2–17.1)
Lymphs Abs: 4303 cells/uL — ABNORMAL HIGH (ref 850–3900)
MCH: 29.4 pg (ref 27.0–33.0)
MCHC: 33.6 g/dL (ref 32.0–36.0)
MCV: 87.6 fL (ref 80.0–100.0)
MPV: 10.1 fL (ref 7.5–12.5)
Monocytes Relative: 3.9 %
Neutro Abs: 7839 cells/uL — ABNORMAL HIGH (ref 1500–7800)
Neutrophils Relative %: 60.3 %
Platelets: 237 10*3/uL (ref 140–400)
RBC: 5.91 10*6/uL — ABNORMAL HIGH (ref 4.20–5.80)
RDW: 13 % (ref 11.0–15.0)
Total Lymphocyte: 33.1 %
WBC: 13 10*3/uL — ABNORMAL HIGH (ref 3.8–10.8)

## 2022-08-19 LAB — COMPLETE METABOLIC PANEL WITH GFR
AG Ratio: 1.1 (calc) (ref 1.0–2.5)
ALT: 23 U/L (ref 9–46)
AST: 17 U/L (ref 10–40)
Albumin: 4.4 g/dL (ref 3.6–5.1)
Alkaline phosphatase (APISO): 149 U/L — ABNORMAL HIGH (ref 36–130)
BUN/Creatinine Ratio: 19 (calc) (ref 6–22)
BUN: 11 mg/dL (ref 7–25)
CO2: 28 mmol/L (ref 20–32)
Calcium: 9.4 mg/dL (ref 8.6–10.3)
Chloride: 94 mmol/L — ABNORMAL LOW (ref 98–110)
Creat: 0.59 mg/dL — ABNORMAL LOW (ref 0.60–1.29)
Globulin: 4.1 g/dL (calc) — ABNORMAL HIGH (ref 1.9–3.7)
Glucose, Bld: 348 mg/dL — ABNORMAL HIGH (ref 65–99)
Potassium: 4.5 mmol/L (ref 3.5–5.3)
Sodium: 136 mmol/L (ref 135–146)
Total Bilirubin: 0.3 mg/dL (ref 0.2–1.2)
Total Protein: 8.5 g/dL — ABNORMAL HIGH (ref 6.1–8.1)
eGFR: 125 mL/min/{1.73_m2} (ref >=60)

## 2022-08-19 LAB — HEPATITIS A ANTIBODY, TOTAL: Hepatitis A AB,Total: REACTIVE — AB

## 2022-08-19 LAB — HEPATITIS B SURFACE ANTIBODY,QUALITATIVE: Hep B S Ab: NONREACTIVE

## 2022-08-19 LAB — HEPATITIS C GENOTYPE

## 2022-08-19 LAB — HEPATITIS B SURFACE ANTIGEN: Hepatitis B Surface Ag: NONREACTIVE

## 2022-08-19 LAB — HEPATITIS B CORE ANTIBODY, TOTAL: Hep B Core Total Ab: NONREACTIVE

## 2022-08-19 LAB — HEPATITIS C RNA QUANTITATIVE
HCV Quantitative Log: 5.91 log IU/mL — ABNORMAL HIGH
HCV RNA, PCR, QN: 812000 IU/mL — ABNORMAL HIGH

## 2022-08-19 LAB — HIV ANTIBODY (ROUTINE TESTING W REFLEX): HIV 1&2 Ab, 4th Generation: NONREACTIVE

## 2022-08-22 ENCOUNTER — Other Ambulatory Visit: Payer: Self-pay | Admitting: Internal Medicine

## 2022-08-22 ENCOUNTER — Other Ambulatory Visit: Payer: Self-pay

## 2022-08-22 ENCOUNTER — Other Ambulatory Visit (HOSPITAL_COMMUNITY): Payer: Self-pay

## 2022-08-22 ENCOUNTER — Ambulatory Visit (HOSPITAL_COMMUNITY): Admission: RE | Admit: 2022-08-22 | Payer: 59 | Source: Ambulatory Visit

## 2022-08-22 MED ORDER — SOFOSBUVIR-VELPATASVIR 400-100 MG PO TABS
1.0000 | ORAL_TABLET | Freq: Every day | ORAL | 2 refills | Status: DC
  Filled 2022-08-22: qty 28, 28d supply, fill #0

## 2022-08-22 NOTE — Progress Notes (Signed)
Epclusa sent in and once approved, can follow up 4 weeks after starting.

## 2022-08-23 DIAGNOSIS — F1120 Opioid dependence, uncomplicated: Secondary | ICD-10-CM | POA: Diagnosis not present

## 2022-08-25 ENCOUNTER — Ambulatory Visit (HOSPITAL_COMMUNITY)
Admission: RE | Admit: 2022-08-25 | Discharge: 2022-08-25 | Disposition: A | Discharge status: Home / Self Care | Payer: 59 | Source: Ambulatory Visit | Attending: Internal Medicine | Admitting: Internal Medicine

## 2022-08-25 ENCOUNTER — Other Ambulatory Visit (HOSPITAL_COMMUNITY): Payer: Self-pay

## 2022-08-25 ENCOUNTER — Other Ambulatory Visit: Payer: Self-pay | Admitting: Pharmacist

## 2022-08-25 DIAGNOSIS — B182 Chronic viral hepatitis C: Secondary | ICD-10-CM

## 2022-08-25 MED ORDER — EPCLUSA 400-100 MG PO TABS: 1.0000 | ORAL_TABLET | Freq: Every day | ORAL | 2 refills | Status: DC

## 2022-08-28 ENCOUNTER — Telehealth: Payer: Self-pay

## 2022-08-28 NOTE — Telephone Encounter (Signed)
[facility] Patient Advocate Encounter   Received notification from [facility] that prior authorization for [name] is required.   PA submitted on 08/28/22 Key [id number] Status is pending    [facility] will continue to follow.   Ileene Patrick, [facility] Patient [facility] for Infectious Disease Phone: [phone] Fax:  [phone]

## 2022-08-29 ENCOUNTER — Telehealth: Payer: Self-pay

## 2022-08-29 DIAGNOSIS — F1120 Opioid dependence, uncomplicated: Secondary | ICD-10-CM | POA: Diagnosis not present

## 2022-08-29 NOTE — Telephone Encounter (Signed)
-----   Message from Jabier Mutton, MD sent at 08/28/2022  4:09 PM EDT ----- His elastography is done and looks good. Please make sure he has f/u with dr Linus Salmons for hep c tx  thanks

## 2022-08-29 NOTE — Telephone Encounter (Signed)
I attempted to contact the patient to relay [place] results. Patient did not answer and a VM was left for the patient to call back. Hammon Jazzmine Kleiman, CMA

## 2022-08-30 NOTE — Telephone Encounter (Signed)
2nd attempt to contact patient

## 2022-08-31 NOTE — Telephone Encounter (Signed)
Third attempt to reach patient, no answer. Left HIPAA compliant voicemail requesting callback.   [name] D Amando Chaput, RN  

## 2022-09-04 NOTE — Telephone Encounter (Signed)
Fourth attempt to reach patient, no answer. Letter mailed to address on file.   Beryle Flock, RN

## 2022-09-05 DIAGNOSIS — F1120 Opioid dependence, uncomplicated: Secondary | ICD-10-CM | POA: Diagnosis not present

## 2022-09-15 NOTE — Telephone Encounter (Signed)
Patient called back regarding results.  Informed him of [place] results. Call was dropped before I could get update on pt medication for hep c treatment. Pharmacy is currently waiting on appeal.   Juanita Laster, RMA

## 2022-09-26 ENCOUNTER — Other Ambulatory Visit (HOSPITAL_COMMUNITY): Payer: Self-pay

## 2022-09-27 ENCOUNTER — Other Ambulatory Visit (HOSPITAL_COMMUNITY): Payer: Self-pay

## 2022-09-27 ENCOUNTER — Telehealth: Payer: Self-pay

## 2022-09-27 NOTE — Telephone Encounter (Signed)
[facility] Patient Advocate Encounter  Prior Authorization for [name] has been approved.    PA# [id number] Effective dates: 09/27/22 through 12/19/22  Prescription [facility] at [facility] I will contact [facility] to see [facility] , and contact patient.  [facility] will continue to follow.  Clearance Coots, CPhT Specialty Pharmacy Patient [facility] for Infectious Disease Phone: [phone] Fax:  [phone]

## 2022-10-03 ENCOUNTER — Telehealth: Payer: Self-pay

## 2022-10-03 NOTE — Telephone Encounter (Signed)
[name] contacted [facility] today to inquire about Epclusa copay. [facility] has called patient several times without response. [name] LVM with him to return call. [facility] will not [facility] unless they reach the patient. Marchelle Folks

## 2022-10-03 NOTE — Telephone Encounter (Signed)
[facility] Patient Advocate Encounter   Was successful in obtaining a [employer] copay card for [employer].  This copay card will make the patients copay $5.00.  I have spoken with the patient.    The billing information is as follows and has been shared with [facility].        Clearance Coots, CPhT Specialty Pharmacy Patient [facility] for Infectious Disease Phone: [phone] Fax:  [phone]

## 2022-10-10 DIAGNOSIS — F1120 Opioid dependence, uncomplicated: Secondary | ICD-10-CM | POA: Diagnosis not present

## 2022-10-23 NOTE — Telephone Encounter (Signed)
Attempted to call patient regarding their Epclusa medication fill. Left voicemail for him to call us back.  Irish Elders, PharmD PGY-1 [facility] Resident

## 2022-11-07 DIAGNOSIS — F1120 Opioid dependence, uncomplicated: Secondary | ICD-10-CM | POA: Diagnosis not present

## 2022-11-10 DIAGNOSIS — F1120 Opioid dependence, uncomplicated: Secondary | ICD-10-CM | POA: Diagnosis not present

## 2022-11-28 DIAGNOSIS — F1120 Opioid dependence, uncomplicated: Secondary | ICD-10-CM | POA: Diagnosis not present

## 2022-12-12 DIAGNOSIS — F1120 Opioid dependence, uncomplicated: Secondary | ICD-10-CM | POA: Diagnosis not present

## 2022-12-13 DIAGNOSIS — F1120 Opioid dependence, uncomplicated: Secondary | ICD-10-CM | POA: Diagnosis not present

## 2022-12-26 DIAGNOSIS — F1120 Opioid dependence, uncomplicated: Secondary | ICD-10-CM | POA: Diagnosis not present

## 2023-01-16 DIAGNOSIS — F1120 Opioid dependence, uncomplicated: Secondary | ICD-10-CM | POA: Diagnosis not present

## 2023-02-13 DIAGNOSIS — F1120 Opioid dependence, uncomplicated: Secondary | ICD-10-CM | POA: Diagnosis not present

## 2023-02-27 DIAGNOSIS — F1120 Opioid dependence, uncomplicated: Secondary | ICD-10-CM | POA: Diagnosis not present

## 2023-03-13 DIAGNOSIS — F1120 Opioid dependence, uncomplicated: Secondary | ICD-10-CM | POA: Diagnosis not present

## 2023-03-16 ENCOUNTER — Encounter (HOSPITAL_COMMUNITY): Payer: Self-pay

## 2023-03-16 ENCOUNTER — Ambulatory Visit (HOSPITAL_COMMUNITY)
Admission: EM | Admit: 2023-03-16 | Discharge: 2023-03-16 | Disposition: A | Discharge status: Home / Self Care | Payer: 59 | Attending: Internal Medicine | Admitting: Internal Medicine

## 2023-03-16 DIAGNOSIS — L089 Local infection of the skin and subcutaneous tissue, unspecified: Secondary | ICD-10-CM

## 2023-03-16 DIAGNOSIS — L723 Sebaceous cyst: Secondary | ICD-10-CM

## 2023-03-16 MED ORDER — DOXYCYCLINE HYCLATE 100 MG PO CAPS: 100.0000 mg | ORAL_CAPSULE | Freq: Two times a day (BID) | ORAL | 0 refills | Status: AC

## 2023-03-16 MED ORDER — LIDOCAINE HCL (PF) 2 % IJ SOLN
INTRAMUSCULAR | Status: AC
  Filled 2023-03-16: qty 5

## 2023-03-16 NOTE — ED Provider Notes (Signed)
[facility]    CSN: [id number] Arrival date & time: 03/16/23  1052      History   Chief Complaint Chief Complaint  Patient presents with   Ear Problem    HPI [name] is a 42 y.o. male.   HPI Had a lump on right earlobe stable for the last 20 years and last p.m. developed increased redness, swelling, tenderness and drainage.  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/17/2015   Opioid use disorder, moderate, on maintenance therapy (HCC) 03/17/2015   Cannabis use disorder, moderate, in sustained remission (HCC) 03/17/2015   Psoriasis 03/17/2015   Aggressive behavior of adult    Opiate dependence, continuous (HCC)    Psychoses (HCC)    Polysubstance abuse (HCC) 04/28/2014    Past Surgical History:  Procedure Laterality Date   I & D EXTREMITY Left 03/25/2017   Procedure: IRRIGATION AND DEBRIDEMENT LEFT ARM ABSCESS;  Surgeon: Kathryne Hitch, MD;  Location: [facility];  Service: Orthopedics;  Laterality: Left;       Home Medications    Prior to Admission medications   Medication Sig Start Date End Date Taking? Authorizing Provider  Cholecalciferol (VITAMIN D3) 50 MCG (2000 UT) capsule TAKE ONE CAPSULE EACH DAY 07/13/20  Yes [provider]  doxycycline (VIBRAMYCIN) 100 MG capsule Take 1 capsule (100 mg total) by mouth 2 (two) times daily for 7 days. 03/16/23 03/23/23 Yes Savreen Gebhardt, Marylene Land, PA  EPCLUSA 400-100 MG TABS Take 1  tablet by mouth daily. 08/25/22  Yes Jennette Kettle, RPH-CPP  glipiZIDE (GLUCOTROL) 10 MG tablet Take 1 tablet (10 mg total) by mouth 2 (two) times daily before a meal. 11/10/20 03/16/23 Yes Edwards, Kinnie Scales, NP  LIPITOR 20 MG tablet Take 20 mg by mouth at bedtime. 07/13/20  Yes [provider]  lisinopril (ZESTRIL) 5 MG tablet Take 1 tablet (5 mg total) by mouth daily. 11/10/20  Yes Grayce Sessions, NP  metFORMIN (GLUCOPHAGE XR) 500 MG 24 hr tablet Take 1 tablet (500 mg total) by mouth daily with breakfast. 11/10/20  Yes Grayce Sessions, NP  methadone (DOLOPHINE) 1 mg/ml oral solution Take 120 mg by mouth.   Yes [provider]  PROTONIX 20 MG tablet Take 20 mg by mouth daily. 07/13/20  Yes [provider]  QUEtiapine (SEROQUEL) 300 MG tablet Take 300 mg by mouth at bedtime.   Yes [provider]  triamcinolone (KENALOG) 0.1 %  07/08/20  Yes [provider]  VISTARIL 50 MG capsule Take 50 mg by mouth 3 (three) times daily as needed. 07/13/20  Yes [provider]    Family History Family History  Problem Relation Age of Onset   Anxiety disorder Mother    Mental illness Neg Hx    Hypertension Neg Hx     Social History Social History   Tobacco Use   Smoking status: Every Day    Current packs/day: 1.00  Types: Cigarettes    Passive exposure: Past   Smokeless tobacco: Never  Vaping Use   Vaping status: Never Used  Substance Use Topics   Alcohol use: No    Comment: 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   Drug use: Not Currently    Types: Other-see comments, Cocaine, Marijuana, Heroin    Comment: clean x 1 year as of 2024 on Methadone     Allergies   Patient has no known allergies.   Review of Systems Review of Systems  Constitutional:  Negative for chills, diaphoresis and fatigue.  Skin:  Positive for color change. Negative for rash and wound.     Physical Exam Triage Vital Signs ED Triage Vitals  Encounter Vitals  Group     BP 03/16/23 1113 130/68     Systolic BP Percentile --      Diastolic BP Percentile --      Pulse Rate 03/16/23 1113 (!) 102     Resp 03/16/23 1113 18     Temp 03/16/23 1113 97.6 F (36.4 C)     Temp Source 03/16/23 1113 Oral     SpO2 03/16/23 1113 92 %     Weight 03/16/23 1112 235 lb (106.6 kg)     Height 03/16/23 1112 5\' 11"  (1.803 m)     Head Circumference --      Peak Flow --      Pain Score 03/16/23 1111 0     Pain Loc --      Pain Education --      Exclude from Growth Chart --    No data found.  Updated Vital Signs BP 130/68 (BP Location: Right Wrist)   Pulse (!) 102   Temp 97.6 F (36.4 C) (Oral)   Resp 18   Ht 5\' 11"  (1.803 m)   Wt 235 lb (106.6 kg)   SpO2 92%   BMI 32.78 kg/m   Visual Acuity Right Eye Distance:   Left Eye Distance:   Bilateral Distance:    Right Eye Near:   Left Eye Near:    Bilateral Near:     Physical Exam Vitals and nursing note reviewed.  HENT:     Ears:     Comments: Right lobule with local swelling, erythema, fluctuant cystic lesion, tenderness no active drainage Neurological:     Mental Status: He is alert.      UC Treatments / Results  Labs (all labs ordered are listed, but only abnormal results are displayed) Labs Reviewed - No data to display  EKG   Radiology No results found.  Procedures Incision and Drainage  Date/Time: 03/16/2023 11:33 AM  Performed by: Meliton Rattan, PA Authorized by: Meliton Rattan, PA   Consent:    Consent obtained:  Verbal   Consent given by:  Patient   Risks discussed:  Bleeding, incomplete drainage, pain and damage to other organs   Alternatives discussed:  No treatment Universal protocol:    Procedure explained and questions answered to patient or proxy's satisfaction: yes     Relevant documents present and verified: yes     Test results available : yes     Imaging studies available: yes     Required blood products, implants, devices, and special equipment  available: yes     Site/side marked: yes     Immediately prior to procedure, a time out was called: yes     Patient identity confirmed:  Verbally with patient Location:    Type:  Abscess  Size:  Right ear Pre-procedure details:    Skin preparation:  Betadine Anesthesia:    Anesthesia method:  Local infiltration   Local anesthetic:  Lidocaine 1% w/o epi Procedure type:    Complexity:  Simple Procedure details:    Incision type: 18-gauge needle.   Incision depth:  Subcutaneous   Drainage:  Purulent   Drainage amount:  Moderate Post-procedure details:    Procedure completion:  Tolerated well, no immediate complications Comments:     Bleeding controlled with pressure, Band-Aid applied  (including critical care time)  Medications Ordered in UC Medications - No data to display  Initial Impression / Assessment and Plan / UC Course  I have reviewed the triage vital signs and the nursing notes.  Pertinent labs & imaging results that were available during my care of the patient were reviewed by me and considered in my medical decision making (see chart for details).     42 year old male with suspected cyst 20 years now infected, office using 18-gauge needle aspiration, patient tolerated procedure well, discussed with patient if lesion recurs should follow-up with dermatology or plastic surgery for cyst removal.  Ask antibiotics given due to erythema  Final diagnoses:  Infected sebaceous cyst   Discharge Instructions   None    ED Prescriptions     Medication Sig Dispense Auth. Provider   doxycycline (VIBRAMYCIN) 100 MG capsule Take 1 capsule (100 mg total) by mouth 2 (two) times daily for 7 days. 14 capsule Meliton Rattan, [place]      PDMP not reviewed this encounter.   Meliton Rattan, [place] 03/16/23 1138

## 2023-03-16 NOTE — ED Triage Notes (Addendum)
Knot on right ear lobe x 20 years. Draining onset last night. Patient states he has been squeezing it for 20 years but last night it started to drain. No pain

## 2023-03-19 ENCOUNTER — Ambulatory Visit (HOSPITAL_COMMUNITY)
Admission: EM | Admit: 2023-03-19 | Discharge: 2023-03-19 | Disposition: A | Discharge status: Home / Self Care | Payer: 59 | Attending: Internal Medicine | Admitting: Internal Medicine

## 2023-03-19 ENCOUNTER — Encounter (HOSPITAL_COMMUNITY): Payer: Self-pay | Admitting: Emergency Medicine

## 2023-03-19 DIAGNOSIS — L720 Epidermal cyst: Secondary | ICD-10-CM | POA: Diagnosis not present

## 2023-03-19 NOTE — Discharge Instructions (Addendum)
At this time, the area is not severely red or painful so likely does not need to be drained again. This is a cyst and needs formal excision for it to go away completely otherwise it will continue to refill after draining.  Will contact primary care to help arrange referral to surgery for right earlobe epidermal inclusion cyst excision Avoid squeezing the area If increased pain, redness or swelling occurs, then return to urgent care. Return to urgent care or PCP if symptoms worsen or fail to resolve.

## 2023-03-19 NOTE — ED Triage Notes (Signed)
Patient was seen on 03/16/2023 and his right ear lobe was drained.  Since then the area, fluid has recollected in the same area, redness, painful, swollen, tender to touch to the area.  Patient is wanting it to be drained again.  Patient has been taken the antibiotics given on 03/16/2023.

## 2023-03-19 NOTE — ED Provider Notes (Signed)
[facility]    CSN: [id number] Arrival date & time: 03/19/23  1308      History   Chief Complaint Chief Complaint  Patient presents with   Abscess    HPI [name] is a 42 y.o. male.   42 year old male who presents to urgent care due to recurrent problems with a right earlobe epidermal inclusion cyst.  He was here on October 11 and had a stab drainage of an infected cyst.  The cyst has been present for over 20 years and has not caused him any problems until recently.  He reports that the area has refilled and is swollen again.  It is not particularly painful or red.  He has finished his antibiotics.  He denies any fevers or chills.  He has no other symptoms.   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   Opioid use disorder, moderate, on maintenance therapy (HCC) 03/17/2015   Cannabis use disorder, moderate, in sustained remission (HCC) 03/17/2015   Psoriasis 03/17/2015   Aggressive behavior of adult    Opiate dependence, continuous (HCC)    Psychoses (HCC)    Polysubstance abuse (HCC) 04/28/2014    Past Surgical History:  Procedure Laterality Date   I & D EXTREMITY Left 03/25/2017   Procedure: IRRIGATION AND DEBRIDEMENT LEFT ARM ABSCESS;  Surgeon: Kathryne Hitch, MD;  Location: [facility];  Service: Orthopedics;  Laterality: Left;       Home Medications    Prior to Admission medications   Medication Sig Start Date End Date Taking? Authorizing  Provider  Cholecalciferol (VITAMIN D3) 50 MCG (2000 UT) capsule TAKE ONE CAPSULE EACH DAY 07/13/20  Yes [provider]  doxycycline (VIBRAMYCIN) 100 MG capsule Take 1 capsule (100 mg total) by mouth 2 (two) times daily for 7 days. 03/16/23 03/23/23 Yes Acevedo, Marylene Land, PA  EPCLUSA 400-100 MG TABS Take 1 tablet by mouth daily. 08/25/22  Yes Jennette Kettle, RPH-CPP  LIPITOR 20 MG tablet Take 20 mg by mouth at bedtime. 07/13/20  Yes [provider]  lisinopril (ZESTRIL) 5 MG tablet Take 1 tablet (5 mg total) by mouth daily. 11/10/20  Yes Grayce Sessions, NP  metFORMIN (GLUCOPHAGE XR) 500 MG 24 hr tablet Take 1 tablet (500 mg total) by mouth daily with breakfast. 11/10/20  Yes Grayce Sessions, NP  methadone (DOLOPHINE) 1 mg/ml oral solution Take 120 mg by mouth.   Yes [provider]  PROTONIX 20 MG tablet Take 20 mg by mouth daily. 07/13/20  Yes [provider]  QUEtiapine (SEROQUEL) 300 MG tablet Take 300 mg by mouth at bedtime.   Yes [provider]  triamcinolone (KENALOG) 0.1 %  07/08/20  Yes [provider]  VISTARIL 50 MG capsule Take 50 mg by mouth 3 (three) times daily as needed. 07/13/20  Yes [provider]  glipiZIDE (GLUCOTROL) 10 MG tablet Take 1 tablet (10 mg total) by mouth 2 (two) times daily  before a meal. 11/10/20 03/16/23  Grayce Sessions, NP    Family History Family History  Problem Relation Age of Onset   Anxiety disorder Mother    Mental illness Neg Hx    Hypertension Neg Hx     Social History Social History   Tobacco Use   Smoking status: Every Day    Current packs/day: 1.00    Types: Cigarettes    Passive exposure: Past   Smokeless tobacco: Never  Vaping Use   Vaping status: Never Used  Substance Use Topics   Alcohol use: No    Comment: 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   Drug use: Not Currently    Types: Other-see comments, Cocaine, Marijuana, Heroin    Comment: clean x 1 year as of 2024 on  Methadone     Allergies   Patient has no known allergies.   Review of Systems Review of Systems   Physical Exam Triage Vital Signs ED Triage Vitals  Encounter Vitals Group     BP 03/19/23 1352 120/80     Systolic BP Percentile --      Diastolic BP Percentile --      Pulse Rate 03/19/23 1352 98     Resp 03/19/23 1352 18     Temp 03/19/23 1352 (!) 97.5 F (36.4 C)     Temp Source 03/19/23 1352 Oral     SpO2 03/19/23 1352 (!) 86 %     Weight 03/19/23 1356 230 lb (104.3 kg)     Height 03/19/23 1356 5\' 11"  (1.803 m)     Head Circumference --      Peak Flow --      Pain Score 03/19/23 1355 7     Pain Loc --      Pain Education --      Exclude from Growth Chart --    No data found.  Updated Vital Signs BP 120/80 (BP Location: Left Arm)   Pulse 98   Temp (!) 97.5 F (36.4 C) (Oral)   Resp 18   Ht 5\' 11"  (1.803 m)   Wt 230 lb (104.3 kg)   SpO2 92%   BMI 32.08 kg/m   Visual Acuity Right Eye Distance:   Left Eye Distance:   Bilateral Distance:    Right Eye Near:   Left Eye Near:    Bilateral Near:     Physical Exam   UC Treatments / Results  Labs (all labs ordered are listed, but only abnormal results are displayed) Labs Reviewed - No data to display  EKG   Radiology No results found.  Procedures Procedures (including critical care time)  Medications Ordered in UC Medications - No data to display  Initial Impression / Assessment and Plan / UC Course  I have reviewed the triage vital signs and the nursing notes.  Pertinent labs & imaging results that were available during my care of the patient were reviewed by me and considered in my medical decision making (see chart for details).     Epidermal inclusion cyst  At this time, the area is not severely red or painful so likely does not need to be drained again. This is a cyst and needs formal excision for it to go away completely otherwise it will continue to refill after draining.  Will contact  primary care to help arrange referral to surgery for right earlobe epidermal inclusion cyst excision Avoid squeezing the area If increased pain, redness or swelling occurs, then return to urgent  care. Return to urgent care or PCP if symptoms worsen or fail to resolve.     Final Clinical Impressions(s) / UC Diagnoses   Final diagnoses:  Epidermal inclusion cyst     Discharge Instructions      At this time, the area is not severely red or painful so likely does not need to be drained again. This is a cyst and needs formal excision for it to go away completely otherwise it will continue to refill after draining.  Will contact primary care to help arrange referral to surgery for right earlobe epidermal inclusion cyst excision Avoid squeezing the area If increased pain, redness or swelling occurs, then return to urgent care. Return to urgent care or PCP if symptoms worsen or fail to resolve.     ED Prescriptions   None    PDMP not reviewed this encounter.   Jerritt, Cardoza, [name] 03/19/23 1440

## 2023-03-27 DIAGNOSIS — F1120 Opioid dependence, uncomplicated: Secondary | ICD-10-CM | POA: Diagnosis not present

## 2023-04-03 DIAGNOSIS — F1120 Opioid dependence, uncomplicated: Secondary | ICD-10-CM | POA: Diagnosis not present

## 2023-04-10 DIAGNOSIS — F1120 Opioid dependence, uncomplicated: Secondary | ICD-10-CM | POA: Diagnosis not present

## 2023-06-19 DIAGNOSIS — F1120 Opioid dependence, uncomplicated: Secondary | ICD-10-CM | POA: Diagnosis not present

## 2023-07-11 ENCOUNTER — Telehealth: Payer: Self-pay

## 2023-07-11 ENCOUNTER — Other Ambulatory Visit (HOSPITAL_COMMUNITY): Payer: Self-pay

## 2023-07-11 NOTE — Telephone Encounter (Signed)
 Patient called to follow up on his Hep C medication and treatment. I did see where he completed his new patient appt as well as the US  that was ordered, but that was almost a year ago. I did let him know that our staff was trying to reach him in regards to results and medication. He stated at that time he lost access to his phone and now has a permanent number.   I wanted to loop you all in for several of his concerns -  I am not sure if the medication was still available to him?   To be proactive we did schedule a f/u with Dr. Efrain on 07/19/2023 due to the timeframe of his last visit.  He did state that there was originally a concern regarding the medication and his psoriasis condition?   Verified contact of [phone]

## 2023-07-11 NOTE — Telephone Encounter (Signed)
 I don't think we have any medicine for him - probably will need to redo PA for him.

## 2023-07-11 NOTE — Telephone Encounter (Signed)
 Yes

## 2023-07-19 ENCOUNTER — Ambulatory Visit (INDEPENDENT_AMBULATORY_CARE_PROVIDER_SITE_OTHER): Payer: 59 | Admitting: Internal Medicine

## 2023-07-19 ENCOUNTER — Other Ambulatory Visit: Payer: Self-pay

## 2023-07-19 ENCOUNTER — Other Ambulatory Visit (HOSPITAL_COMMUNITY): Payer: Self-pay

## 2023-07-19 ENCOUNTER — Telehealth: Payer: Self-pay

## 2023-07-19 ENCOUNTER — Encounter: Payer: Self-pay | Admitting: Internal Medicine

## 2023-07-19 VITALS — BP 118/84 | HR 109 | Temp 97.5°F | Ht 71.0 in | Wt 245.0 lb

## 2023-07-19 DIAGNOSIS — Z23 Encounter for immunization: Secondary | ICD-10-CM | POA: Diagnosis not present

## 2023-07-19 DIAGNOSIS — B182 Chronic viral hepatitis C: Secondary | ICD-10-CM | POA: Diagnosis not present

## 2023-07-19 MED ORDER — EPCLUSA 400-100 MG PO TABS: 1.0000 | ORAL_TABLET | Freq: Every day | ORAL | 2 refills | Status: DC

## 2023-07-19 NOTE — Assessment & Plan Note (Addendum)
He has genotype Ia and will be confirmed today with his viral load and also recheck the hepatitis B surface antigen to assure no new infection.  Will continue with Epclusa prescription and likely will need a new prior authorization and we will send this in.  I have personally spent 30 minutes involved in face-to-face and non-face-to-face activities for this patient on the day of the visit. Professional time spent includes the following activities: Preparing to see the patient (review of tests), Obtaining and/or reviewing separately obtained history (admission/discharge record), Performing a medically appropriate examination and/or evaluation , Ordering medications/tests/procedures, referring and communicating with other health care professionals, Documenting clinical information in the EMR, Independently interpreting results (not separately reported), Communicating results to the patient/family/caregiver, Counseling and educating the patient/family/caregiver and Care coordination (not separately reported).

## 2023-07-19 NOTE — Progress Notes (Signed)
   Subjective:    Patient ID: [name], male    DOB: Jun 04, 1981, 43 y.o.   MRN: [id number]  HPI [name] is here for follow-up of chronic hepatitis C. He was seen as a new patient last spring for hepatitis C and has genotype 1a infection and had a low probability elastography.  He was then lost to follow-up after medication was approved and never started any medication.  He is here today to consider restarting.  He continues being drug-free.  His psoriasis has been very active.   Review of Systems  Constitutional:  Negative for fatigue.  Gastrointestinal:  Negative for diarrhea.       Objective:   Physical Exam Eyes:     General: No scleral icterus. Pulmonary:     Effort: Pulmonary effort is normal.  Neurological:     Mental Status: He is alert.   SH: + tobacco        Assessment & Plan:

## 2023-07-19 NOTE — Telephone Encounter (Signed)
[facility] Patient Advocate Encounter   Received notification from [facility] that prior authorization for [facility] is required.   PA submitted on 07/19/23 Key [id number] Status is pending    [facility] will continue to follow.   Clearance Coots, CPhT Specialty Pharmacy Patient [facility] for Infectious Disease Phone: [phone] Fax:  [phone]

## 2023-07-20 ENCOUNTER — Other Ambulatory Visit (HOSPITAL_COMMUNITY): Payer: Self-pay

## 2023-07-21 LAB — HEPATIC FUNCTION PANEL
AG Ratio: 1 (calc) (ref 1.0–2.5)
ALT: 23 U/L (ref 9–46)
AST: 18 U/L (ref 10–40)
Albumin: 4.3 g/dL (ref 3.6–5.1)
Alkaline phosphatase (APISO): 99 U/L (ref 36–130)
Bilirubin, Direct: 0.1 mg/dL (ref 0.0–0.2)
Globulin: 4.1 g/dL — ABNORMAL HIGH (ref 1.9–3.7)
Indirect Bilirubin: 0.2 mg/dL (ref 0.2–1.2)
Total Bilirubin: 0.3 mg/dL (ref 0.2–1.2)
Total Protein: 8.4 g/dL — ABNORMAL HIGH (ref 6.1–8.1)

## 2023-07-21 LAB — HEPATITIS C RNA QUANTITATIVE
HCV Quantitative Log: 6.02 {Log} — ABNORMAL HIGH
HCV RNA, PCR, QN: 1050000 [IU]/mL — ABNORMAL HIGH

## 2023-07-21 LAB — HEPATITIS B SURFACE ANTIGEN: Hepatitis B Surface Ag: NONREACTIVE

## 2023-07-23 ENCOUNTER — Other Ambulatory Visit (HOSPITAL_COMMUNITY): Payer: Self-pay

## 2023-07-23 ENCOUNTER — Telehealth: Payer: Self-pay

## 2023-07-23 DIAGNOSIS — B182 Chronic viral hepatitis C: Secondary | ICD-10-CM

## 2023-07-23 MED ORDER — EPCLUSA 400-100 MG PO TABS: 1.0000 | ORAL_TABLET | Freq: Every day | ORAL | 2 refills | Status: DC

## 2023-07-23 NOTE — Addendum Note (Signed)
Addended by: Jennette Kettle on: 07/23/2023 09:27 AM   Modules accepted: Orders

## 2023-07-23 NOTE — Telephone Encounter (Signed)
Received notification from [facility] regarding a prior authorization for Epclusa (Brand Name). Authorization has been APPROVED from 07/20/23 to 10/11/23.   Patient must fill through  [facility] [place]  Authorization # [id number] Phone # [phone]   Copay Card

## 2023-07-26 ENCOUNTER — Telehealth: Payer: Self-pay

## 2023-07-26 NOTE — Telephone Encounter (Signed)
Tried to reach patient concerning the interaction between PPIs and Epclusa. No answer and was not able to leave a voicemail.

## 2023-07-27 ENCOUNTER — Other Ambulatory Visit (HOSPITAL_COMMUNITY): Payer: Self-pay

## 2023-07-30 ENCOUNTER — Other Ambulatory Visit (HOSPITAL_COMMUNITY): Payer: Self-pay

## 2023-08-02 ENCOUNTER — Telehealth: Payer: Self-pay

## 2023-08-02 ENCOUNTER — Other Ambulatory Visit (HOSPITAL_COMMUNITY): Payer: Self-pay

## 2023-08-02 ENCOUNTER — Other Ambulatory Visit: Payer: Self-pay

## 2023-08-02 MED ORDER — SOFOSBUVIR-VELPATASVIR 400-100 MG PO TABS
1.0000 | ORAL_TABLET | Freq: Every day | ORAL | 2 refills | Status: DC
  Filled 2023-08-02: qty 28, 28d supply, fill #0
  Filled 2023-09-06: qty 28, 28d supply, fill #1
  Filled 2023-09-24 – 2023-09-27 (×2): qty 28, 28d supply, fill #2

## 2023-08-02 NOTE — Telephone Encounter (Signed)
 Called [name] to counsel on Epclusa for Hepatitis C treatment and was not able to reach him today.

## 2023-08-02 NOTE — Addendum Note (Signed)
 Addended by: Jennette Kettle on: 08/02/2023 09:08 AM   Modules accepted: Orders

## 2023-08-02 NOTE — Progress Notes (Signed)
 Specialty Pharmacy Initial Fill Coordination Note  [name] is a 43 y.o. male contacted today regarding initial fill of specialty medication(s) Sofosbuvir-Velpatasvir   Patient requested Courier to Provider Office   Delivery date: 08/06/23   Verified address: [address] [place] [address]   Medication will be filled on 08/07/23.   Patient is aware of 4.00 copayment.  Put on AR/ACCT

## 2023-08-03 ENCOUNTER — Telehealth: Payer: Self-pay

## 2023-08-03 NOTE — Telephone Encounter (Signed)
 Called the patient today to counsel on Epclusa. I wanted to discuss how to take Epclusa, possible side effects, and drug interactions with antiacids/PPIs, diabetes medications (risk of hypoglycemia), and increased risk for myopathy with atorvastatin. Unfortunately, I was unable to reach him and unable to leave a voicemail.

## 2023-08-06 ENCOUNTER — Telehealth: Payer: Self-pay

## 2023-08-06 NOTE — Telephone Encounter (Signed)
 [facility] Patient Advocate Encounter  Patient's medications [name] have been couriered to [facility] from [facility] and will be picked up on 08/07/23.  Clearance Coots, CPhT Specialty Pharmacy Patient [facility] for Infectious Disease Phone: [phone] Fax:  [phone]

## 2023-08-09 ENCOUNTER — Other Ambulatory Visit: Payer: Self-pay | Admitting: Pharmacist

## 2023-08-09 ENCOUNTER — Telehealth: Payer: Self-pay | Admitting: Pharmacist

## 2023-08-09 ENCOUNTER — Other Ambulatory Visit (HOSPITAL_COMMUNITY): Payer: Self-pay

## 2023-08-09 NOTE — Telephone Encounter (Signed)
 Patient is approved to receive Epclusa x 12 weeks for chronic Hepatitis C infection. Counseled patient to take Epclusa daily with or without food. Encouraged patient not to miss any doses and explained how their chance of cure could go down with each dose missed. Counseled patient on what to do if dose is missed - if it is closer to the missed dose take immediately; if closer to next dose skip dose and take the next dose at the usual time. Counseled patient on common side effects such as headache, fatigue, and nausea and that these normally decrease with time.   I reviewed patient medications and found a couple drug interactions. He is taking atorvastatin every day, so counseled him to monitor for any significant muscle pain or darkened urine. He can continue his dose like normal though. He also takes Protonix every morning, so counseled him to take Epclusa at lunch or dinner.   Instructed patient to call clinic if he wishes to start a new medication during course of therapy. Also advised patient to call if he experiences any side effects. Patient will follow-up with Dr. Luciana Axe on 4/3.  Margarite Gouge, PharmD, CPP, BCIDP, AAHIVP Clinical Pharmacist Practitioner Infectious Diseases Clinical Pharmacist [facility] for Infectious Disease

## 2023-08-09 NOTE — Progress Notes (Signed)
 Specialty Pharmacy Initiation Note   [name] is a 43 y.o. male who will be followed by the specialty pharmacy service for RxSp Hepatitis C    Review of administration, indication, effectiveness, safety, potential side effects, storage/disposable, and missed dose instructions occurred today for patient's specialty medication(s) Sofosbuvir-Velpatasvir     Patient/Caregiver did not have any additional questions or concerns.   Patient's therapy is appropriate to: Initiate    Goals Addressed             This Visit's Progress    Achieve virologic cure as evidenced by SVR       Patient is initiating therapy. Patient will be evaluated at upcoming provider appointment to assess progress      Maintain optimal adherence to therapy       Patient is initiating therapy. Patient will be evaluated at upcoming provider appointment to assess progress         Jennette Kettle Specialty Pharmacist

## 2023-08-22 ENCOUNTER — Other Ambulatory Visit: Payer: Self-pay

## 2023-08-24 ENCOUNTER — Other Ambulatory Visit: Payer: Self-pay

## 2023-08-27 ENCOUNTER — Other Ambulatory Visit: Payer: Self-pay

## 2023-08-29 ENCOUNTER — Ambulatory Visit: Payer: 59 | Admitting: Internal Medicine

## 2023-09-05 NOTE — Progress Notes (Unsigned)
 HPI: [name] is a 43 y.o. male who presents to the [facility] pharmacy clinic for Hepatitis C follow-up.  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   LIPITOR 20 MG TABLET    Take 20 mg by mouth at bedtime.   LISINOPRIL (ZESTRIL) 5 MG TABLET    Take 1 tablet (5 mg total) by mouth daily.   METFORMIN (GLUCOPHAGE XR) 500 MG 24 HR TABLET    Take 1 tablet (500 mg total) by mouth daily with breakfast.   METHADONE (DOLOPHINE) 1 MG/ML ORAL SOLUTION    Take 120 mg by mouth.   PROTONIX 20 MG TABLET    Take 20 mg by mouth daily.   QUETIAPINE (SEROQUEL) 300 MG TABLET    Take 300 mg by mouth at bedtime.   SOFOSBUVIR-VELPATASVIR (EPCLUSA) 400-100 MG TABS    Take 1 tablet by mouth daily.   TRIAMCINOLONE (KENALOG) 0.1 %       VISTARIL 50 MG CAPSULE    Take 50 mg by mouth 3 (three) times daily as needed.  Modified Medications   No  medications on file  Discontinued Medications   No medications on file    Labs: Hepatitis C Lab Results  Component Value Date   HCVGENOTYPE 1a 08/16/2022   HCVRNAPCRQN 1,050,000 (H) 07/19/2023   HCVRNAPCRQN 812,000 (H) 08/16/2022   Hepatitis B Lab Results  Component Value Date   HEPBSAB NON-REACTIVE 08/16/2022   HEPBSAG NON-REACTIVE 07/19/2023   HEPBCAB NON-REACTIVE 08/16/2022   Hepatitis A Lab Results  Component Value Date   HAV REACTIVE (A) 08/16/2022   HIV Lab Results  Component Value Date   HIV NON-REACTIVE 08/16/2022   HIV Non Reactive 03/25/2017   Lab Results  Component Value Date   CREATININE 0.59 (L) 08/16/2022   CREATININE 1.01 07/21/2020   CREATININE 0.78 03/27/2017   CREATININE 0.78 03/26/2017   CREATININE 0.81 03/25/2017   Lab Results  Component Value Date   AST 18 07/19/2023   AST 17 08/16/2022   AST 26 03/24/2017   ALT 23 07/19/2023   ALT 23 08/16/2022   ALT 25 03/24/2017   INR 1.00 03/24/2017    Assessment: [name] is here today for his 4 week follow up after starting Epclusa for his chronic Hepatitis C infection. He started taking on 08/09/23 and is tolerating it well. No side effects or issues taking  it. He takes it around *** every day. The [facility] has been trying to reach him for his second refill (tried on 3/19, 3/21, 3/24 x 2) with no luck.    Plan: - Hep C RNA + CMP today - Continue Epclusa x 12 weeks - Follow up with *** on ***  Mairely Foxworth L. Kion Huntsberry, PharmD, BCIDP, AAHIVP, CPP Clinical Pharmacist Practitioner Infectious Diseases Clinical Pharmacist [facility] 09/05/2023, 8:48 PM

## 2023-09-06 ENCOUNTER — Ambulatory Visit (INDEPENDENT_AMBULATORY_CARE_PROVIDER_SITE_OTHER): Payer: MEDICAID | Admitting: Pharmacist

## 2023-09-06 ENCOUNTER — Other Ambulatory Visit: Payer: Self-pay

## 2023-09-06 ENCOUNTER — Other Ambulatory Visit (HOSPITAL_COMMUNITY): Payer: Self-pay

## 2023-09-06 DIAGNOSIS — Z23 Encounter for immunization: Secondary | ICD-10-CM | POA: Diagnosis not present

## 2023-09-06 DIAGNOSIS — B182 Chronic viral hepatitis C: Secondary | ICD-10-CM

## 2023-09-06 NOTE — Progress Notes (Signed)
 Specialty Pharmacy Refill Coordination Note  [name] is a 43 y.o. male assessed today regarding refills of clinic administered specialty medication(s) Sofosbuvir-Velpatasvir   Clinic requested Courier to Provider Office   Delivery date: 09/10/23   Verified address: [address] [place] [address]   Medication will be filled on 09/07/23.

## 2023-09-07 ENCOUNTER — Other Ambulatory Visit: Payer: Self-pay

## 2023-09-10 ENCOUNTER — Telehealth: Payer: Self-pay

## 2023-09-10 LAB — COMPREHENSIVE METABOLIC PANEL WITH GFR
AG Ratio: 1.2 (calc) (ref 1.0–2.5)
ALT: 19 U/L (ref 9–46)
AST: 38 U/L (ref 10–40)
Albumin: 4.5 g/dL (ref 3.6–5.1)
Alkaline phosphatase (APISO): 81 U/L (ref 36–130)
BUN: 23 mg/dL (ref 7–25)
CO2: 28 mmol/L (ref 20–32)
Calcium: 9.7 mg/dL (ref 8.6–10.3)
Chloride: 95 mmol/L — ABNORMAL LOW (ref 98–110)
Creat: 0.99 mg/dL (ref 0.60–1.29)
Globulin: 3.9 g/dL — ABNORMAL HIGH (ref 1.9–3.7)
Glucose, Bld: 136 mg/dL — ABNORMAL HIGH (ref 65–99)
Potassium: 3.7 mmol/L (ref 3.5–5.3)
Sodium: 135 mmol/L (ref 135–146)
Total Bilirubin: 0.3 mg/dL (ref 0.2–1.2)
Total Protein: 8.4 g/dL — ABNORMAL HIGH (ref 6.1–8.1)
eGFR: 98 mL/min/{1.73_m2} (ref >=60)

## 2023-09-10 LAB — HEPATITIS C RNA QUANTITATIVE
HCV Quantitative Log: <1.18 {Log_IU}/mL
HCV RNA, PCR, QN: <15 [IU]/mL

## 2023-09-10 NOTE — Telephone Encounter (Signed)
 [facility] Patient Advocate Encounter  Patient's medications [name] have been couriered to [facility] from [facility] and was picked up on 09/06/23.  2nd box   Clearance Coots, CPhT Specialty Pharmacy Patient [facility] Phone: [phone] Fax:  [phone]

## 2023-09-24 ENCOUNTER — Other Ambulatory Visit (HOSPITAL_COMMUNITY): Payer: Self-pay

## 2023-09-24 ENCOUNTER — Other Ambulatory Visit: Payer: Self-pay

## 2023-09-24 NOTE — Progress Notes (Signed)
 Specialty Pharmacy Refill Coordination Note  [name] is a 43 y.o. male assessed today regarding refills of clinic administered specialty medication(s) Sofosbuvir -Velpatasvir    Clinic requested Courier to Provider Office   Delivery date: 09/28/23   Verified address: [address] [place] [address]   Medication will be filled on 09/27/23.

## 2023-09-27 ENCOUNTER — Other Ambulatory Visit (HOSPITAL_COMMUNITY): Payer: Self-pay

## 2023-09-27 ENCOUNTER — Other Ambulatory Visit: Payer: Self-pay

## 2023-09-28 ENCOUNTER — Other Ambulatory Visit: Payer: Self-pay

## 2023-09-30 NOTE — Progress Notes (Unsigned)
 HPI: [name] is a 43 y.o. male who presents to the [facility] pharmacy clinic for Hepatitis C follow-up.  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) 03/17/2015   Opioid use disorder, moderate, on maintenance therapy (HCC) 03/17/2015   Cannabis use disorder, moderate, in sustained remission (HCC) 03/17/2015   Psoriasis 03/17/2015   Aggressive behavior of adult    Opiate dependence, continuous (HCC)    Psychoses (HCC)    Polysubstance abuse (HCC) 04/28/2014    Patient's Medications  New Prescriptions   No medications on file  Previous Medications   CHOLECALCIFEROL  (VITAMIN D3) 50 MCG (2000 UT) CAPSULE    TAKE ONE CAPSULE EACH DAY   GLIPIZIDE  (GLUCOTROL ) 10 MG TABLET    Take 1 tablet (10 mg total) by mouth 2 (two) times daily before a meal.   LIPITOR 20 MG TABLET    Take 20 mg by mouth at bedtime.   LISINOPRIL  (ZESTRIL ) 5 MG TABLET    Take 1 tablet (5 mg total) by mouth daily.   METFORMIN  (GLUCOPHAGE  XR) 500 MG 24 HR TABLET    Take 1 tablet (500 mg total) by mouth daily with breakfast.   METHADONE  (DOLOPHINE ) 1 MG/ML ORAL SOLUTION    Take 120 mg by mouth.   PROTONIX 20 MG TABLET    Take 20 mg by mouth daily.   QUETIAPINE  (SEROQUEL ) 300 MG TABLET    Take 300 mg by mouth at bedtime.   SOFOSBUVIR -VELPATASVIR  (EPCLUSA ) 400-100 MG TABS    Take 1 tablet by mouth daily.   TRIAMCINOLONE  (KENALOG ) 0.1 %       VISTARIL  50 MG CAPSULE    Take 50 mg by mouth 3 (three) times daily as needed.  Modified  Medications   No medications on file  Discontinued Medications   No medications on file    Labs: Hepatitis C Lab Results  Component Value Date   HCVGENOTYPE 1a 08/16/2022   HCVRNAPCRQN <15 NOT DETECTED 09/06/2023   HCVRNAPCRQN 1,050,000 (H) 07/19/2023   HCVRNAPCRQN 812,000 (H) 08/16/2022   Hepatitis B Lab Results  Component Value Date   HEPBSAB NON-REACTIVE 08/16/2022   HEPBSAG NON-REACTIVE 07/19/2023   HEPBCAB NON-REACTIVE 08/16/2022   Hepatitis A Lab Results  Component Value Date   HAV REACTIVE (A) 08/16/2022   HIV Lab Results  Component Value Date   HIV NON-REACTIVE 08/16/2022   HIV Non Reactive 03/25/2017   Lab Results  Component Value Date   CREATININE 0.99 09/06/2023   CREATININE 0.59 (L) 08/16/2022   CREATININE 1.01 07/21/2020   CREATININE 0.78 03/27/2017   CREATININE 0.78 03/26/2017   Lab Results  Component Value Date   AST 38 09/06/2023   AST 18 07/19/2023   AST 17 08/16/2022   ALT 19 09/06/2023   ALT 23 07/19/2023   ALT 23 08/16/2022   INR 1.00 03/24/2017    Assessment: [name] is a 43 yo male presenting for a hepatitis C follow-up appointment. He remains on Epclusa  with ***.    Immunizations: eligible for  tdap, second hepatitis B vaccine due in William.  Plan: ***

## 2023-10-01 ENCOUNTER — Telehealth: Payer: Self-pay

## 2023-10-01 ENCOUNTER — Ambulatory Visit: Payer: MEDICAID | Admitting: Pharmacist

## 2023-10-01 DIAGNOSIS — B182 Chronic viral hepatitis C: Secondary | ICD-10-CM

## 2023-10-01 NOTE — Telephone Encounter (Signed)
 [facility] Patient Advocate Encounter  Patient's medications Epclusa  have been couriered to [facility] from [facility] and will be  picked up.  3rd box of Epclusa   Roylene Corn, CPhT Specialty Pharmacy Patient [facility] Phone: [phone] Fax:  [phone]

## 2023-10-10 ENCOUNTER — Other Ambulatory Visit: Payer: MEDICAID

## 2023-10-10 ENCOUNTER — Other Ambulatory Visit: Payer: Self-pay

## 2023-10-10 ENCOUNTER — Telehealth: Payer: Self-pay

## 2023-10-10 NOTE — Telephone Encounter (Signed)
 Medication was picked up at [facility] on  10/10/23 8:40am    EPCLUSA  THE 3RD BOX

## 2023-10-12 ENCOUNTER — Ambulatory Visit: Payer: MEDICAID | Admitting: Pharmacist

## 2023-11-09 ENCOUNTER — Ambulatory Visit (INDEPENDENT_AMBULATORY_CARE_PROVIDER_SITE_OTHER): Payer: MEDICAID | Admitting: Pharmacist

## 2023-11-09 ENCOUNTER — Other Ambulatory Visit: Payer: Self-pay

## 2023-11-09 DIAGNOSIS — B182 Chronic viral hepatitis C: Secondary | ICD-10-CM | POA: Diagnosis not present

## 2023-11-09 DIAGNOSIS — Z23 Encounter for immunization: Secondary | ICD-10-CM

## 2023-11-09 NOTE — Progress Notes (Signed)
 HPI: [name] is a 43 y.o. male who presents to the [name] for Hepatitis C follow-up.  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   LIPITOR 20 MG TABLET    Take 20 mg by mouth at bedtime.   LISINOPRIL  (ZESTRIL ) 5 MG TABLET    Take 1 tablet (5 mg total) by mouth daily.   METFORMIN  (GLUCOPHAGE  XR) 500 MG 24 HR TABLET    Take 1 tablet (500 mg total) by mouth daily with breakfast.   METHADONE  (DOLOPHINE ) 1 MG/ML ORAL SOLUTION    Take 120 mg by mouth.   PROTONIX 20 MG TABLET    Take 20 mg by mouth daily.   QUETIAPINE  (SEROQUEL ) 300 MG TABLET    Take 300 mg by mouth at bedtime.   SOFOSBUVIR -VELPATASVIR  (EPCLUSA ) 400-100 MG TABS    Take 1 tablet by mouth daily.   TRIAMCINOLONE  (KENALOG ) 0.1 %       VISTARIL  50 MG CAPSULE    Take 50 mg by mouth 3  (three) times daily as needed.  Modified Medications   No medications on file  Discontinued Medications   No medications on file    Allergies: No Known Allergies  Past Medical History: Past Medical History:  Diagnosis Date   Abscess    GERD (gastroesophageal reflux disease)    Heroin addiction (HCC)    Psoriasis     Social History: Social History   Socioeconomic History   Marital status: Single    Spouse name: Not on file   Number of children: Not on file   Years of education: Not on file   Highest education level: Not on file  Occupational History   Not on file  Tobacco Use   Smoking status: Every Day    Current packs/day: 1.00    Types: Cigarettes    Passive exposure: Past   Smokeless tobacco: Never   Tobacco comments:    1 pack every day and a half  Vaping Use   Vaping status: Never Used  Substance and Sexual Activity   Alcohol use: No    Comment: 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   Drug use: Not Currently    Types: Other-see comments, Cocaine, Marijuana, Heroin    Comment: clean x 1 year as of 2024 on Methadone   Sexual activity: Never  Other Topics Concern   Not on file  Social History Narrative   Not on file   Social Drivers of Health   Financial Resource Strain: Not on file  Food Insecurity: Not on file  Transportation Needs: Not on file  Physical Activity: Not on file  Stress: Not on file  Social Connections: Not on file    Labs: Hepatitis C Lab Results  Component Value Date   HCVGENOTYPE 1a 08/16/2022   HCVRNAPCRQN <15 NOT DETECTED 09/06/2023   HCVRNAPCRQN 1,050,000 (H) 07/19/2023   HCVRNAPCRQN 812,000 (H) 08/16/2022   Hepatitis B Lab Results  Component Value Date   HEPBSAB NON-REACTIVE 08/16/2022   HEPBSAG NON-REACTIVE 07/19/2023   HEPBCAB NON-REACTIVE 08/16/2022   Hepatitis A Lab Results  Component Value Date   HAV REACTIVE (A) 08/16/2022   HIV Lab Results  Component Value Date   HIV NON-REACTIVE 08/16/2022   HIV Non  Reactive 03/25/2017   Lab Results  Component Value Date   CREATININE 0.99 09/06/2023   CREATININE 0.59 (L) 08/16/2022   CREATININE 1.01 07/21/2020   CREATININE 0.78 03/27/2017   CREATININE 0.78 03/26/2017   Lab Results  Component Value Date   AST 38 09/06/2023   AST 18 07/19/2023   AST 17 08/16/2022   ALT 19 09/06/2023   ALT 23 07/19/2023   ALT 23 08/16/2022   INR 1.00 03/24/2017    Assessment: [name] presents to clinic today for HCV follow-up as they have completed their full 12 weeks of Epclusa . Only missed 1-2 doses in his last month; recently finished therapy ~2 days ago. States he did experience significant stomach upset and nausea even though he took the Epclusa  with food; states these symptoms have already improved after completing therapy. Will check HCV RNA today and follow-up in 3 months with [name] for SVR.  Eligible for 2/2 HBV, Tdap, and PCV20 vaccines which were all administered today. (Eligible for PCV20 given compensated cirrhosis.) Will check HBV immunity when he follows up with [name] in 3 months.    Plan: - Check HCV RNA - Administer Tdap, PCV20, and 2/2 HBV vaccines  - Follow up with [name] on 02/15/24  [name], PharmD, CPP, BCIDP, AAHIVP Clinical Pharmacist Practitioner Infectious Diseases Clinical Pharmacist [facility]

## 2023-11-11 LAB — HEPATITIS C RNA QUANTITATIVE
HCV Quantitative Log: <1.18 {Log_IU}/mL
HCV RNA, PCR, QN: <15 [IU]/mL

## 2023-11-12 ENCOUNTER — Ambulatory Visit (INDEPENDENT_AMBULATORY_CARE_PROVIDER_SITE_OTHER): Payer: MEDICAID | Admitting: Podiatry

## 2023-11-12 ENCOUNTER — Encounter: Payer: Self-pay | Admitting: Podiatry

## 2023-11-12 ENCOUNTER — Ambulatory Visit (INDEPENDENT_AMBULATORY_CARE_PROVIDER_SITE_OTHER): Payer: MEDICAID

## 2023-11-12 DIAGNOSIS — D492 Neoplasm of unspecified behavior of bone, soft tissue, and skin: Secondary | ICD-10-CM

## 2023-11-12 DIAGNOSIS — M7751 Other enthesopathy of right foot: Secondary | ICD-10-CM | POA: Diagnosis not present

## 2023-11-12 DIAGNOSIS — M216X2 Other acquired deformities of left foot: Secondary | ICD-10-CM | POA: Diagnosis not present

## 2023-11-12 DIAGNOSIS — M216X1 Other acquired deformities of right foot: Secondary | ICD-10-CM

## 2023-11-12 DIAGNOSIS — M7752 Other enthesopathy of left foot: Secondary | ICD-10-CM

## 2023-11-12 NOTE — Patient Instructions (Signed)
 Take dressing off in 8 hours and wash the foot with soap and water. If it is hurting or becomes uncomfortable before the 8 hours, go ahead and remove the bandage and wash the area.  If it blisters, apply antibiotic ointment and a band-aid.  Monitor for any signs/symptoms of infection. Call the office immediately if any occur or go directly to the emergency room. Call with any questions/concerns.

## 2023-11-12 NOTE — Progress Notes (Signed)
  Subjective:  Patient ID: [name], male    DOB: 02/09/1981,  MRN: [id number]  Chief Complaint  Patient presents with   Foot Pain    RM#11 Bilateral foot pain ongoing for many years worsening now.Feels like walking on pebbles.Patient is a diabetic and sugars controlled.    Discussed the use of AI scribe software for clinical note transcription with the patient, who gave verbal consent to proceed.  History of Present Illness [name] is a 43 year old male with psoriasis and diabetes who presents with foot pain and calluses.  He experiences significant pain in both feet, described as throbbing and severe, particularly in areas with calluses to the balls of his feet.  The pain affects his ability to walk barefoot and sit comfortably.  He denies any recent injury or stepping any foreign objects.  He has not received recent professional treatment for his feet. He uses a substance with a glue-like smell and scrapes the affected areas with a soap stone. Cushion stick-on pads have been ineffective. He attributes the initial cause of his foot issues to wearing [employer] boots.   In addition to calluses, he attempted to self-treat an ingrown toenail with a pocket knife and tissue, which provided temporary relief.  Currently no pain.  No swelling or redness or any drainage.  His medical history includes psoriasis and diabetes, which have influenced his treatment options. No recent injuries or bleeding in the affected areas. He is not taking specific medications for his foot condition.      Objective:    Physical Exam General: AAO x3, NAD  Dermatological: Psoriasis present to bilateral lower extremities.  On bilateral feet to metatarsal for right foot worse than left is a punctate annular hyperkeratotic lesion without any underlying ulceration drainage or any signs of infection.  No puncture wound or foreign object noted.  Vascular: Dorsalis Pedis artery and Posterior Tibial artery  pedal pulses are 2/4 bilateral with immedate capillary fill time. There is no pain with calf compression, swelling, warmth, erythema.   Neruologic: Grossly intact via light touch bilateral.   Musculoskeletal: Prominent metatarsal heads.      Results   RADIOLOGY Foot X-ray: There is no evidence of acute fracture.  Joint space maintained.  No evidence of foreign body.  (11/12/2023)   Assessment:   1. Prominent metatarsal head, left   2. Prominent metatarsal head, right   3. Skin neoplasm      Plan:  Patient was evaluated and treated and all questions answered.  Assessment and Plan Assessment & Plan Skin lesion bilateral feet Lesion resembling plantar wart, possibly due to footwear pressure. Differential includes wart versus callus. - Sharply debrided lesion with any complications of bleeding.  Cantharone was applied followed by an occlusive bandage.  Postprocedure instructions discussed.   - Advise monitoring for infection signs. - Provide cushions for pressure relief. - Schedule follow-up if unresolved in weeks.  Prominent metatarsal heads - Continue offloading.   Return if symptoms worsen or fail to improve.   Charity Conch DPM

## 2023-12-30 ENCOUNTER — Other Ambulatory Visit: Payer: Self-pay

## 2023-12-30 ENCOUNTER — Emergency Department (HOSPITAL_COMMUNITY): Payer: MEDICAID

## 2023-12-30 ENCOUNTER — Encounter (HOSPITAL_COMMUNITY): Payer: Self-pay | Admitting: Emergency Medicine

## 2023-12-30 ENCOUNTER — Inpatient Hospital Stay (HOSPITAL_COMMUNITY)
Admission: EM | Admit: 2023-12-30 | Discharge: 2024-01-04 | DRG: 091 | Disposition: A | Discharge status: Home / Self Care | Payer: MEDICAID | Attending: Family Medicine | Admitting: Family Medicine

## 2023-12-30 DIAGNOSIS — R9431 Abnormal electrocardiogram [ECG] [EKG]: Secondary | ICD-10-CM | POA: Diagnosis present

## 2023-12-30 DIAGNOSIS — Z79899 Other long term (current) drug therapy: Secondary | ICD-10-CM

## 2023-12-30 DIAGNOSIS — E860 Dehydration: Secondary | ICD-10-CM | POA: Diagnosis present

## 2023-12-30 DIAGNOSIS — B182 Chronic viral hepatitis C: Secondary | ICD-10-CM | POA: Diagnosis present

## 2023-12-30 DIAGNOSIS — Z1152 Encounter for screening for COVID-19: Secondary | ICD-10-CM

## 2023-12-30 DIAGNOSIS — F1120 Opioid dependence, uncomplicated: Secondary | ICD-10-CM | POA: Diagnosis not present

## 2023-12-30 DIAGNOSIS — F17210 Nicotine dependence, cigarettes, uncomplicated: Secondary | ICD-10-CM | POA: Diagnosis present

## 2023-12-30 DIAGNOSIS — L03113 Cellulitis of right upper limb: Secondary | ICD-10-CM | POA: Diagnosis present

## 2023-12-30 DIAGNOSIS — E861 Hypovolemia: Secondary | ICD-10-CM | POA: Diagnosis present

## 2023-12-30 DIAGNOSIS — G929 Unspecified toxic encephalopathy: Secondary | ICD-10-CM | POA: Diagnosis present

## 2023-12-30 DIAGNOSIS — F11159 Opioid abuse with opioid-induced psychotic disorder, unspecified: Secondary | ICD-10-CM | POA: Diagnosis present

## 2023-12-30 DIAGNOSIS — E1165 Type 2 diabetes mellitus with hyperglycemia: Secondary | ICD-10-CM | POA: Diagnosis present

## 2023-12-30 DIAGNOSIS — Z818 Family history of other mental and behavioral disorders: Secondary | ICD-10-CM

## 2023-12-30 DIAGNOSIS — F1021 Alcohol dependence, in remission: Secondary | ICD-10-CM | POA: Diagnosis present

## 2023-12-30 DIAGNOSIS — I330 Acute and subacute infective endocarditis: Secondary | ICD-10-CM | POA: Diagnosis not present

## 2023-12-30 DIAGNOSIS — I38 Endocarditis, valve unspecified: Secondary | ICD-10-CM | POA: Diagnosis not present

## 2023-12-30 DIAGNOSIS — L409 Psoriasis, unspecified: Secondary | ICD-10-CM | POA: Diagnosis present

## 2023-12-30 DIAGNOSIS — F0390 Unspecified dementia without behavioral disturbance: Secondary | ICD-10-CM | POA: Diagnosis present

## 2023-12-30 DIAGNOSIS — A411 Sepsis due to other specified staphylococcus: Secondary | ICD-10-CM | POA: Diagnosis not present

## 2023-12-30 DIAGNOSIS — Z7984 Long term (current) use of oral hypoglycemic drugs: Secondary | ICD-10-CM | POA: Diagnosis not present

## 2023-12-30 DIAGNOSIS — R7881 Bacteremia: Secondary | ICD-10-CM | POA: Diagnosis not present

## 2023-12-30 DIAGNOSIS — L03114 Cellulitis of left upper limb: Secondary | ICD-10-CM | POA: Diagnosis present

## 2023-12-30 DIAGNOSIS — E871 Hypo-osmolality and hyponatremia: Secondary | ICD-10-CM | POA: Diagnosis present

## 2023-12-30 DIAGNOSIS — B957 Other staphylococcus as the cause of diseases classified elsewhere: Secondary | ICD-10-CM | POA: Diagnosis not present

## 2023-12-30 DIAGNOSIS — R4182 Altered mental status, unspecified: Principal | ICD-10-CM

## 2023-12-30 DIAGNOSIS — E785 Hyperlipidemia, unspecified: Secondary | ICD-10-CM | POA: Diagnosis present

## 2023-12-30 DIAGNOSIS — F1990 Other psychoactive substance use, unspecified, uncomplicated: Secondary | ICD-10-CM | POA: Diagnosis not present

## 2023-12-30 DIAGNOSIS — G9340 Encephalopathy, unspecified: Secondary | ICD-10-CM | POA: Diagnosis present

## 2023-12-30 LAB — URINALYSIS, W/ REFLEX TO CULTURE (INFECTION SUSPECTED)
Bilirubin Urine: NEGATIVE
Glucose, UA: NEGATIVE mg/dL
Hgb urine dipstick: NEGATIVE
Ketones, ur: 20 mg/dL — AB
Leukocytes,Ua: NEGATIVE
Nitrite: NEGATIVE
Protein, ur: 30 mg/dL — AB
Specific Gravity, Urine: >1.046 — ABNORMAL HIGH (ref 1.005–1.030)
pH: 6 (ref 5.0–8.0)

## 2023-12-30 LAB — ETHANOL: Alcohol, Ethyl (B): <15 mg/dL (ref <15)

## 2023-12-30 LAB — RESP PANEL BY RT-PCR (RSV, FLU A&B, COVID)  RVPGX2
Influenza A by PCR: NEGATIVE
Influenza B by PCR: NEGATIVE
Resp Syncytial Virus by PCR: NEGATIVE
SARS Coronavirus 2 by RT PCR: NEGATIVE

## 2023-12-30 LAB — CBC WITH DIFFERENTIAL/PLATELET
Abs Immature Granulocytes: 0.07 K/uL (ref 0.00–0.07)
Basophils Absolute: 0.1 K/uL (ref 0.0–0.1)
Basophils Relative: 0 %
Eosinophils Absolute: 0 K/uL (ref 0.0–0.5)
Eosinophils Relative: 0 %
HCT: 52.6 % — ABNORMAL HIGH (ref 39.0–52.0)
Hemoglobin: 17.2 g/dL — ABNORMAL HIGH (ref 13.0–17.0)
Immature Granulocytes: 0 %
Lymphocytes Relative: 19 %
Lymphs Abs: 3.3 K/uL (ref 0.7–4.0)
MCH: 28.1 pg (ref 26.0–34.0)
MCHC: 32.7 g/dL (ref 30.0–36.0)
MCV: 85.9 fL (ref 80.0–100.0)
Monocytes Absolute: 0.8 K/uL (ref 0.1–1.0)
Monocytes Relative: 5 %
Neutro Abs: 13.4 K/uL — ABNORMAL HIGH (ref 1.7–7.7)
Neutrophils Relative %: 76 %
Platelets: 321 K/uL (ref 150–400)
RBC: 6.12 MIL/uL — ABNORMAL HIGH (ref 4.22–5.81)
RDW: 13.7 % (ref 11.5–15.5)
WBC: 17.7 K/uL — ABNORMAL HIGH (ref 4.0–10.5)
nRBC: 0 % (ref 0.0–0.2)

## 2023-12-30 LAB — COMPREHENSIVE METABOLIC PANEL WITH GFR
ALT: 14 U/L (ref 0–44)
AST: 15 U/L (ref 15–41)
Albumin: 4.1 g/dL (ref 3.5–5.0)
Alkaline Phosphatase: 85 U/L (ref 38–126)
Anion gap: 13 (ref 5–15)
BUN: 16 mg/dL (ref 6–20)
CO2: 25 mmol/L (ref 22–32)
Calcium: 9.7 mg/dL (ref 8.9–10.3)
Chloride: 96 mmol/L — ABNORMAL LOW (ref 98–111)
Creatinine, Ser: 0.77 mg/dL (ref 0.61–1.24)
GFR, Estimated: >60 mL/min (ref >60)
Glucose, Bld: 141 mg/dL — ABNORMAL HIGH (ref 70–99)
Potassium: 3.8 mmol/L (ref 3.5–5.1)
Sodium: 134 mmol/L — ABNORMAL LOW (ref 135–145)
Total Bilirubin: 0.7 mg/dL (ref 0.0–1.2)
Total Protein: 9.6 g/dL — ABNORMAL HIGH (ref 6.5–8.1)

## 2023-12-30 LAB — CBG MONITORING, ED: Glucose-Capillary: 158 mg/dL — ABNORMAL HIGH (ref 70–99)

## 2023-12-30 LAB — PROTIME-INR
INR: 1.1 (ref 0.8–1.2)
Prothrombin Time: 14.3 s (ref 11.4–15.2)

## 2023-12-30 LAB — TSH: TSH: 1.312 u[IU]/mL (ref 0.350–4.500)

## 2023-12-30 LAB — ACETAMINOPHEN LEVEL: Acetaminophen (Tylenol), Serum: <10 ug/mL — ABNORMAL LOW (ref 10–30)

## 2023-12-30 LAB — I-STAT CG4 LACTIC ACID, ED: Lactic Acid, Venous: 1.1 mmol/L (ref 0.5–1.9)

## 2023-12-30 LAB — D-DIMER, QUANTITATIVE: D-Dimer, Quant: 0.46 ug{FEU}/mL (ref 0.00–0.50)

## 2023-12-30 LAB — HEMOGLOBIN A1C
Hgb A1c MFr Bld: 5.5 % (ref 4.8–5.6)
Mean Plasma Glucose: 111.15 mg/dL

## 2023-12-30 LAB — RAPID HIV SCREEN (HIV 1/2 AB+AG)
HIV 1/2 Antibodies: NONREACTIVE
HIV-1 P24 Antigen - HIV24: NONREACTIVE

## 2023-12-30 LAB — GLUCOSE, CAPILLARY
Glucose-Capillary: 132 mg/dL — ABNORMAL HIGH (ref 70–99)
Glucose-Capillary: 235 mg/dL — ABNORMAL HIGH (ref 70–99)

## 2023-12-30 LAB — TROPONIN I (HIGH SENSITIVITY)
Troponin I (High Sensitivity): 4 ng/L (ref <18)
Troponin I (High Sensitivity): 3 ng/L (ref <18)

## 2023-12-30 LAB — SALICYLATE LEVEL: Salicylate Lvl: <7 mg/dL — ABNORMAL LOW (ref 7.0–30.0)

## 2023-12-30 LAB — AMMONIA: Ammonia: 41 umol/L — ABNORMAL HIGH (ref 9–35)

## 2023-12-30 MED ORDER — DEXMEDETOMIDINE HCL IN NACL 200 MCG/50ML IV SOLN
INTRAVENOUS | Status: AC
  Filled 2023-12-30: qty 50

## 2023-12-30 MED ORDER — METRONIDAZOLE 500 MG/100ML IV SOLN
500.0000 mg | Freq: Once | INTRAVENOUS | Status: AC
  Administered 2023-12-30: 500 mg via INTRAVENOUS
  Filled 2023-12-30: qty 100

## 2023-12-30 MED ORDER — LACTATED RINGERS IV BOLUS (SEPSIS)
1000.0000 mL | Freq: Once | INTRAVENOUS | Status: AC
  Administered 2023-12-30: 1000 mL via INTRAVENOUS

## 2023-12-30 MED ORDER — HYDROXYZINE PAMOATE 50 MG PO CAPS
50.0000 mg | ORAL_CAPSULE | Freq: Four times a day (QID) | ORAL | Status: DC | PRN
  Filled 2023-12-30: qty 1

## 2023-12-30 MED ORDER — IOHEXOL 300 MG/ML  SOLN
100.0000 mL | Freq: Once | INTRAMUSCULAR | Status: AC | PRN
Start: 2023-12-30 — End: 2023-12-30
  Administered 2023-12-30: 100 mL via INTRAVENOUS

## 2023-12-30 MED ORDER — ENOXAPARIN SODIUM 40 MG/0.4ML IJ SOSY
40.0000 mg | PREFILLED_SYRINGE | INTRAMUSCULAR | Status: DC
  Administered 2024-01-01 – 2024-01-04 (×4): 40 mg via SUBCUTANEOUS
  Filled 2023-12-30 (×5): qty 0.4

## 2023-12-30 MED ORDER — METHADONE HCL 10 MG PO TABS
50.0000 mg | ORAL_TABLET | Freq: Every day | ORAL | Status: DC
  Administered 2023-12-30 – 2023-12-31 (×2): 50 mg via ORAL
  Filled 2023-12-30 (×2): qty 5

## 2023-12-30 MED ORDER — LACTATED RINGERS IV SOLN: INTRAVENOUS | Status: AC

## 2023-12-30 MED ORDER — LISINOPRIL 5 MG PO TABS
5.0000 mg | ORAL_TABLET | Freq: Every day | ORAL | Status: DC
  Administered 2023-12-31 – 2024-01-04 (×5): 5 mg via ORAL
  Filled 2023-12-30 (×5): qty 1

## 2023-12-30 MED ORDER — INSULIN ASPART 100 UNIT/ML IJ SOLN
0.0000 [IU] | Freq: Three times a day (TID) | INTRAMUSCULAR | Status: DC
  Administered 2024-01-01: 1 [IU] via SUBCUTANEOUS
  Administered 2024-01-02: 2 [IU] via SUBCUTANEOUS

## 2023-12-30 MED ORDER — PANTOPRAZOLE SODIUM 20 MG PO TBEC
20.0000 mg | DELAYED_RELEASE_TABLET | Freq: Every day | ORAL | Status: DC
  Administered 2023-12-31 – 2024-01-04 (×5): 20 mg via ORAL
  Filled 2023-12-30 (×5): qty 1

## 2023-12-30 MED ORDER — ENOXAPARIN SODIUM 40 MG/0.4ML IJ SOSY
40.0000 mg | PREFILLED_SYRINGE | Freq: Every day | INTRAMUSCULAR | Status: DC
  Filled 2023-12-30: qty 0.4

## 2023-12-30 MED ORDER — SOFOSBUVIR-VELPATASVIR 400-100 MG PO TABS: 1.0000 | ORAL_TABLET | Freq: Every day | ORAL | Status: DC

## 2023-12-30 MED ORDER — ONDANSETRON HCL 4 MG/2ML IJ SOLN
4.0000 mg | Freq: Once | INTRAMUSCULAR | Status: AC
  Administered 2023-12-30: 4 mg via INTRAVENOUS
  Filled 2023-12-30: qty 2

## 2023-12-30 MED ORDER — NICOTINE 14 MG/24HR TD PT24
14.0000 mg | MEDICATED_PATCH | Freq: Once | TRANSDERMAL | Status: AC
  Administered 2023-12-30: 14 mg via TRANSDERMAL
  Filled 2023-12-30: qty 1

## 2023-12-30 MED ORDER — SODIUM CHLORIDE 0.9 % IV SOLN
1.0000 g | INTRAVENOUS | Status: DC
  Filled 2023-12-30 (×2): qty 10

## 2023-12-30 MED ORDER — VANCOMYCIN HCL IN DEXTROSE 1-5 GM/200ML-% IV SOLN
1000.0000 mg | Freq: Once | INTRAVENOUS | Status: AC
  Administered 2023-12-30: 1000 mg via INTRAVENOUS
  Filled 2023-12-30: qty 200

## 2023-12-30 MED ORDER — ACETAMINOPHEN 325 MG PO TABS
650.0000 mg | ORAL_TABLET | Freq: Four times a day (QID) | ORAL | Status: DC | PRN
  Filled 2023-12-30: qty 2

## 2023-12-30 MED ORDER — METFORMIN HCL ER 500 MG PO TB24
500.0000 mg | ORAL_TABLET | Freq: Every day | ORAL | Status: DC
  Administered 2024-01-01 – 2024-01-04 (×4): 500 mg via ORAL
  Filled 2023-12-30 (×4): qty 1

## 2023-12-30 MED ORDER — SODIUM CHLORIDE 0.9 % IV SOLN: INTRAVENOUS | Status: DC

## 2023-12-30 MED ORDER — DEXMEDETOMIDINE HCL IN NACL 200 MCG/50ML IV SOLN
0.0000 ug/kg/h | INTRAVENOUS | Status: DC
  Administered 2023-12-30: 0.5 ug/kg/h via INTRAVENOUS
  Administered 2023-12-31: 0.7 ug/kg/h via INTRAVENOUS
  Administered 2023-12-31: 0.6 ug/kg/h via INTRAVENOUS
  Filled 2023-12-30 (×2): qty 50

## 2023-12-30 MED ORDER — CLONIDINE HCL 0.1 MG PO TABS
0.1000 mg | ORAL_TABLET | Freq: Once | ORAL | Status: AC
  Administered 2023-12-30: 0.1 mg via ORAL
  Filled 2023-12-30: qty 1

## 2023-12-30 MED ORDER — SODIUM CHLORIDE 0.9 % IV SOLN
2.0000 g | Freq: Once | INTRAVENOUS | Status: AC
  Administered 2023-12-30: 2 g via INTRAVENOUS
  Filled 2023-12-30: qty 12.5

## 2023-12-30 MED ORDER — TRIAMCINOLONE ACETONIDE 0.1 % EX CREA
TOPICAL_CREAM | Freq: Three times a day (TID) | CUTANEOUS | Status: DC
  Filled 2023-12-30 (×4): qty 15

## 2023-12-30 MED ORDER — HYDROXYZINE HCL 25 MG PO TABS
50.0000 mg | ORAL_TABLET | Freq: Four times a day (QID) | ORAL | Status: DC | PRN
  Administered 2023-12-30 – 2024-01-04 (×8): 50 mg via ORAL
  Filled 2023-12-30 (×8): qty 2

## 2023-12-30 MED ORDER — QUETIAPINE FUMARATE 100 MG PO TABS
300.0000 mg | ORAL_TABLET | Freq: Every day | ORAL | Status: DC
  Administered 2023-12-30 – 2024-01-03 (×5): 300 mg via ORAL
  Filled 2023-12-30 (×5): qty 3

## 2023-12-30 MED ORDER — ATORVASTATIN CALCIUM 10 MG PO TABS
20.0000 mg | ORAL_TABLET | Freq: Every day | ORAL | Status: DC
  Administered 2023-12-31 – 2024-01-03 (×4): 20 mg via ORAL
  Filled 2023-12-30 (×4): qty 2

## 2023-12-30 MED ORDER — HALOPERIDOL LACTATE 5 MG/ML IJ SOLN
5.0000 mg | Freq: Once | INTRAMUSCULAR | Status: AC
  Administered 2023-12-30: 5 mg via INTRAVENOUS
  Filled 2023-12-30: qty 1

## 2023-12-30 MED ORDER — GLIPIZIDE 10 MG PO TABS
10.0000 mg | ORAL_TABLET | Freq: Two times a day (BID) | ORAL | Status: DC
  Administered 2023-12-31 – 2024-01-03 (×7): 10 mg via ORAL
  Filled 2023-12-30 (×9): qty 1

## 2023-12-30 NOTE — Progress Notes (Signed)
 Pt seems to be paranoid and is refusing bladder scans . Asked RN to leave the room . . . Will try again later .

## 2023-12-30 NOTE — Sepsis Progress Note (Signed)
 Elink following code sepsis

## 2023-12-30 NOTE — Progress Notes (Signed)
 Per William NP pt will be on soft restraint and sent to icu on a precedex  drip . . . Pt is currently being transported to icu with security at bedside .    [facility] aware Charge Nurse Jon aware

## 2023-12-30 NOTE — ED Notes (Signed)
 Patient transported to MRI

## 2023-12-30 NOTE — ED Notes (Signed)
 Unable to obtain vital signs at time of triage.  Patient appears paranoid and will not let vitals be taken safely

## 2023-12-30 NOTE — ED Notes (Signed)
 The patient has been dressed out and his belongings have been given to his dad. Pt has been wanded by security.

## 2023-12-30 NOTE — Progress Notes (Signed)
 Pt is withdrawing and having visual hallucinations. He is uncooperative and belligerent with staff . He is also frantically pacing and said multiple times, I will knock y'all out! .   Witnessed by Jon RN Witnessed by [employer] CNA

## 2023-12-30 NOTE — Progress Notes (Signed)
 Security is at the bedside as patient is not following commands and is restless, having visual hallucinations and impulsive . . .  All PRN's have been given at this time . Will page overnight MD . . SABRA

## 2023-12-30 NOTE — ED Notes (Signed)
 Patient refused vitals at this time.

## 2023-12-30 NOTE — ED Provider Notes (Signed)
 [place] EMERGENCY DEPARTMENT AT [facility] Provider Note   CSN: [id number] Arrival date & time: 12/30/23  9483     Patient presents with: Psychiatric Evaluation and Hallucinations  HPI [name] is a 43 y.o. male with history of polysubstance abuse, chronic hepatitis C, IV drug use presenting for hallucinations.  He states he has been coming off methadone  for the last couple weeks.  He states that he cannot stop vomiting he has been very hot and sweating.  Endorses generalized abdominal pain.  Denies chest pain or shortness of breath.  Denies headache visual disturbance or nuchal rigidity.  Unsure if he had a fever at home.  He states that about 2 days ago started to see people that were not there.  He states he cannot remember the last time he used IV drugs but it may have been recently.   HPI     Prior to Admission medications   Medication Sig Start Date End Date Taking? Authorizing Provider  Cholecalciferol  (VITAMIN D3) 50 MCG (2000 UT) capsule TAKE ONE CAPSULE EACH DAY 07/13/20   [provider]  glipiZIDE  (GLUCOTROL ) 10 MG tablet Take 1 tablet (10 mg total) by mouth 2 (two) times daily before a meal. 11/10/20 07/19/23  Celestia Rosaline SQUIBB, NP  LIPITOR 20 MG tablet Take 20 mg by mouth at bedtime. 07/13/20   [provider]  lisinopril  (ZESTRIL ) 5 MG tablet Take 1 tablet (5 mg total) by mouth daily. 11/10/20   Celestia Rosaline SQUIBB, NP  metFORMIN  (GLUCOPHAGE  XR) 500 MG 24 hr tablet Take 1 tablet (500 mg total) by mouth daily with breakfast. 11/10/20   Celestia Rosaline SQUIBB, NP  methadone  (DOLOPHINE ) 1 mg/ml oral solution Take 120 mg by mouth.    [provider]  PROTONIX  20 MG tablet Take 20 mg by mouth daily. 07/13/20   [provider]  QUEtiapine  (SEROQUEL ) 300 MG tablet Take 300 mg by mouth at bedtime.    [provider]  Sofosbuvir -Velpatasvir  (EPCLUSA ) 400-100 MG TABS Take 1 tablet by mouth daily. 08/02/23   Waddell Alan PARAS,  RPH-CPP  triamcinolone  (KENALOG ) 0.1 %  07/08/20   [provider]  VISTARIL  50 MG capsule Take 50 mg by mouth 3 (three) times daily as needed. 07/13/20   [provider]    Allergies: Patient has no known allergies.    Review of Systems See HPI   Physical Exam   Vitals:   12/30/23 0546 12/30/23 1011  BP:  (!) 124/107  Pulse:  (!) 101  Resp:  16  Temp: 98.5 F (36.9 C) (!) 97.5 F (36.4 C)  SpO2:  97%    CONSTITUTIONAL:  well-appearing, NAD NEURO:  Alert and oriented x 3, CN 3-12 grossly intact, withdrawn, at times would lose his train of thought, eyes are somewhat dilated bilaterally, sensation intact distally, strength is symmetric in all extremities.  Finger-nose normal.  Gait is steady. EYES:  eyes equal and reactive ENT/NECK:  Supple, no stridor  CARDIO:  regular rate and rhythm, appears well-perfused  PULM:  No respiratory distress, CTAB GI/GU:  non-distended, soft, general tenderness MSK/SPINE:  No gross deformities, no edema, moves all extremities  SKIN: Erythematous red punctate scarring lesions noted in primarily the dorsal aspects of both hands  *Additional and/or pertinent findings included in MDM below  (all labs ordered are listed, but only abnormal results are displayed) Labs Reviewed  COMPREHENSIVE METABOLIC PANEL WITH GFR - Abnormal; Notable for the following components:  Result Value   Sodium 134 (*)    Chloride 96 (*)    Glucose, Bld 141 (*)    Total Protein 9.6 (*)    All other components within normal limits  SALICYLATE LEVEL - Abnormal; Notable for the following components:   Salicylate Lvl <7.0 (*)    All other components within normal limits  ACETAMINOPHEN  LEVEL - Abnormal; Notable for the following components:   Acetaminophen  (Tylenol ), Serum <10 (*)    All other components within normal limits  CBC WITH DIFFERENTIAL/PLATELET - Abnormal; Notable for the following components:   WBC 17.7 (*)    RBC 6.12 (*)    Hemoglobin  17.2 (*)    HCT 52.6 (*)    Neutro Abs 13.4 (*)    All other components within normal limits  CBG MONITORING, ED - Abnormal; Notable for the following components:   Glucose-Capillary 158 (*)    All other components within normal limits  RESP PANEL BY RT-PCR (RSV, FLU A&B, COVID)  RVPGX2  CULTURE, BLOOD (ROUTINE X 2)  CULTURE, BLOOD (ROUTINE X 2)  ETHANOL  PROTIME-INR  D-DIMER, QUANTITATIVE  RAPID URINE DRUG SCREEN, HOSP PERFORMED  URINALYSIS, W/ REFLEX TO CULTURE (INFECTION SUSPECTED)  TSH  RPR  RAPID HIV SCREEN (HIV 1/2 AB+AG)  I-STAT CG4 LACTIC ACID, ED  I-STAT CG4 LACTIC ACID, ED  TROPONIN I (HIGH SENSITIVITY)  TROPONIN I (HIGH SENSITIVITY)    EKG: None  Radiology: CT ABDOMEN PELVIS W CONTRAST Result Date: 12/30/2023 CLINICAL DATA:  Sepsis. EXAM: CT ABDOMEN AND PELVIS WITH CONTRAST TECHNIQUE: Multidetector CT imaging of the abdomen and pelvis was performed using the standard protocol following bolus administration of intravenous contrast. RADIATION DOSE REDUCTION: This exam was performed according to the departmental dose-optimization program which includes automated exposure control, adjustment of the mA and/or kV according to patient size and/or use of iterative reconstruction technique. CONTRAST:  OMNIPAQUE  IOHEXOL  300 MG/ML  SOLN COMPARISON:  None Available. FINDINGS: Evaluation of this exam is limited due to respiratory motion. Lower chest: The visualized lung bases are clear. No intra-abdominal free air or free fluid. Hepatobiliary: Subcentimeter hypodense lesion in the right lobe of the liver is too small to characterize. No biliary dilatation. The gallbladder is unremarkable. Pancreas: Unremarkable. No pancreatic ductal dilatation or surrounding inflammatory changes. Spleen: Normal in size without focal abnormality. Adrenals/Urinary Tract: The adrenal glands unremarkable. There is a 2 cm left renal inferior pole exophytic cyst. There is no hydronephrosis on either side.  The visualized ureters and urinary bladder appear unremarkable. Stomach/Bowel: There is no bowel obstruction or active inflammation. The appendix is normal. Vascular/Lymphatic: Mild aortoiliac atherosclerotic disease. The IVC is unremarkable. No portal venous gas. There is no adenopathy. Reproductive: The prostate and seminal vesicles are grossly remarkable. No pelvic mass. Other: None Musculoskeletal: No acute osseous pathology. IMPRESSION: 1. No acute intra-abdominal or pelvic pathology. 2.  Aortic Atherosclerosis (ICD10-I70.0). Electronically Signed   By: Vanetta Chou M.D.   On: 12/30/2023 10:42   CT Head Wo Contrast Result Date: 12/30/2023 EXAM: CT HEAD WITHOUT CONTRAST 12/30/2023 09:57:07 AM TECHNIQUE: CT of the head was performed without the administration of intravenous contrast. Automated exposure control, iterative reconstruction, and/or weight based adjustment of the mA/kV was utilized to reduce the radiation dose to as low as reasonably achievable. COMPARISON: CT head without contrast 05/29/2006. CLINICAL HISTORY: Mental status change, unknown cause. Patient brought to ED by father for evaluation of hallucinating and coming off methadone . Father reports last dose of methadone  was yesterday.  FINDINGS: BRAIN AND VENTRICLES: No acute hemorrhage. Gray-Schellinger differentiation is preserved. No hydrocephalus. No extra-axial collection. No mass effect or midline shift. ORBITS: No acute abnormality. SINUSES: No acute abnormality. SOFT TISSUES AND SKULL: No acute soft tissue abnormality. No skull fracture. IMPRESSION: 1. No acute intracranial abnormality. Electronically signed by: Lonni Necessary MD 12/30/2023 10:18 AM EDT RP Workstation: [id number]   DG Chest Port 1 View Result Date: 12/30/2023 CLINICAL DATA:  Questionable sepsis. EXAM: PORTABLE CHEST 1 VIEW COMPARISON:  Chest radiograph dated 03/24/2017. FINDINGS: The heart size and mediastinal contours are within normal limits. Both lungs are  clear. The visualized skeletal structures are unremarkable. IMPRESSION: No active disease. Electronically Signed   By: Vanetta Chou M.D.   On: 12/30/2023 09:21     .Critical Care  Performed by: Lang Norleen POUR, [name] Authorized by: Lang Norleen POUR, [name]   Critical care provider statement:    Critical care time (minutes):  30   Critical care was necessary to treat or prevent imminent or life-threatening deterioration of the following conditions:  Sepsis ([place]ern for bacteremia)   Critical care was time spent personally by me on the following activities:  Development of treatment plan with patient or surrogate, discussions with consultants, evaluation of patient's response to treatment, examination of patient, ordering and review of laboratory studies, ordering and review of radiographic studies, ordering and performing treatments and interventions, pulse oximetry, re-evaluation of patient's condition and review of old charts    Medications Ordered in the ED  lactated ringers  infusion (0 mLs Intravenous Paused 12/30/23 1135)  methadone  (DOLOPHINE ) tablet 50 mg (50 mg Oral Given 12/30/23 0924)  nicotine  (NICODERM CQ  - dosed in mg/24 hours) patch 14 mg (14 mg Transdermal Patch Applied 12/30/23 1025)  lactated ringers  bolus 1,000 mL (0 mLs Intravenous Stopped 12/30/23 1116)  iohexol  (OMNIPAQUE ) 300 MG/ML solution 100 mL (100 mLs Intravenous Contrast Given 12/30/23 0947)  ceFEPIme  (MAXIPIME ) 2 g in sodium chloride  0.9 % 100 mL IVPB (0 g Intravenous Stopped 12/30/23 1116)  metroNIDAZOLE  (FLAGYL ) IVPB 500 mg (0 mg Intravenous Stopped 12/30/23 1116)  vancomycin  (VANCOCIN ) IVPB 1000 mg/200 mL premix (0 mg Intravenous Stopped 12/30/23 1117)  ondansetron  (ZOFRAN ) injection 4 mg (4 mg Intravenous Given 12/30/23 1002)  cloNIDine  (CATAPRES ) tablet 0.1 mg (0.1 mg Oral Given 12/30/23 0924)  lactated ringers  bolus 1,000 mL (0 mLs Intravenous Stopped 12/30/23 1135)                                    Medical  Decision Making Amount and/or Complexity of Data Reviewed Labs: ordered. Radiology: ordered.  Risk OTC drugs. Prescription drug management. Decision regarding hospitalization.   Initial Impression and Ddx 43 year old well-appearing male presenting for hallucinations.  Exam notable for, tachycardia, withdrawn and flat at times losing his train of thought but otherwise alert and oriented x 3 otherwise reassuring.  DDx includes stroke, meningitis, bacteremia, sepsis, electrolyte derangement, withdrawal, other. Patient PMH that increases complexity of ED encounter:   history of polysubstance abuse, chronic hepatitis C, IV drug  Interpretation of Diagnostics - I independent reviewed and interpreted the labs as followed: Leukocytosis (17.7)  - I independently visualized the following imaging with scope of interpretation limited to determining acute life threatening conditions related to emergency care: CT head and ab/pelvis, CXR which revealed no acute abnormality  -I personally reviewed and interpreted EKG which revealed sinus rhythm  -Per chart review, has been admitted for bacteremia in  the past  Patient Reassessment and Ultimate Disposition/Management On reassessment, remained stable, mental status relatively unchanged.  At this time given concern for recent IV drug use, leukocytosis and altered mental status, feel that he needs to be evaluated for sepsis and concern for bacteremia.  Placed him on broad spectrum antibiotics and he is receiving fluids as well.  Admitted to hospital service with Dr. Alvia for further evaluation.  Patient management required discussion with the following services or consulting groups:  Hospitalist Service  Complexity of Problems Addressed Acute complicated illness or Injury  Additional Data Reviewed and Analyzed Further history obtained from: Further history from spouse/family member, Past medical history and medications listed in the EMR, and Prior  ED visit notes  Patient Encounter Risk Assessment Consideration of hospitalization      Final diagnoses:  Altered mental status, unspecified altered mental status type    ED Discharge Orders     None          Lang Norleen MARLA DEVONNA 12/30/23 1136    Griselda Norris, MD 01/01/24 [phone]

## 2023-12-30 NOTE — ED Notes (Signed)
 Pt out off the floor for CT

## 2023-12-30 NOTE — Progress Notes (Signed)
 Non violent restraints applied at 2135 . Alvia DRAFTS MD aware

## 2023-12-30 NOTE — ED Notes (Signed)
 2nd IV site not working after giving 10 ml of blood for cultures.  Blue top only sent

## 2023-12-30 NOTE — ED Triage Notes (Signed)
 Patient coming to ED for evaluation of hallucinating and coming off methadone .  Patient brought in my father.  Father states he has not been eating and is coming off methadone .  He is also seeing things around the house.  Patient refusing vitals and will not answer questions.  Father reports last dose of methadone  was yesterday.

## 2023-12-30 NOTE — H&P (Addendum)
 History and Physical    [name]:[id number] DOB: [birth year] DOA: 12/30/2023  PCP: Celestia Rosaline SQUIBB, NP Patient coming from: home  Chief Complaint: hallucination  HPI: [name] is a 43 y.o. male with medical history significant of psoriasis, IV drug use, hepatitis C, GERD, heroin addiction, left abscesses was brought in by his father complaining that patient is hallucinating now that he is off of his methadone .  History is mainly obtained from the patient's chart.  Tried to reach his father unable to reach him.  Patient is aware he is in the hospital he is not clear why he is in the hospital.  He keeps repeating the same words.  Per chart he has been off of methadone  for the couple of weeks.  Patient had nausea vomiting and abdominal pain.  He denies diarrhea.  Denies fever and chills.  Denies headache.  Patient started seeing people that were not there 2 days ago.  He could not remember when was the last time he used IV drugs. Father reported he is not eating and drinking since he has been off of methadone .  Unclear who stopped his methadone .  ED Course: Code sepsis was alerted in the ER due to leukocytosis tachycardia, afebrile 95% on room air respiratory rate 20  Received cefepime , Flagyl , vancomycin  in the ED. His Matarazzo count was 17.7 hemoglobin 17.2 BMP essentially unremarkable. Influenza COVID and RSV negative.  Tylenol  level less than 10 salicylates less than 7.  TSH 1.312. UA showed ketones. CT abdomen pelvis and head showed no acute findings.  Urine drug screen pending. Lactic acid 1.1  Review of Systems: As per HPI otherwise all other systems reviewed and are negative   Past Medical History:  Diagnosis Date   Abscess    GERD (gastroesophageal reflux disease)    Heroin addiction (HCC)    Psoriasis     Past Surgical History:  Procedure Laterality Date   I & D EXTREMITY Left 03/25/2017   Procedure: IRRIGATION AND DEBRIDEMENT LEFT ARM ABSCESS;  Surgeon:  Vernetta Lonni GRADE, MD;  Location: [facility] OR;  Service: Orthopedics;  Laterality: Left;    Social History   Socioeconomic History   Marital status: Single    Spouse name: Not on file   Number of children: Not on file   Years of education: Not on file   Highest education level: Not on file  Occupational History   Not on file  Tobacco Use   Smoking status: Every Day    Current packs/day: 1.00    Types: Cigarettes    Passive exposure: Past   Smokeless tobacco: Never   Tobacco comments:    1 pack every day and a half  Vaping Use   Vaping status: Never Used  Substance and Sexual Activity   Alcohol use: No    Comment: 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   Drug use: Not Currently    Types: Other-see comments, Cocaine, Marijuana, Heroin    Comment: clean x 1 year as of 2024 on Methadone    Sexual activity: Never  Other Topics Concern   Not on file  Social History Narrative   Not on file   Social Drivers of Health   Financial Resource Strain: Not on file  Food Insecurity: No Food Insecurity (12/30/2023)   Hunger Vital Sign    Worried About Running Out of Food in the Last Year: Never true    Ran Out of Food in the Last Year: Never true  Transportation  Needs: No Transportation Needs (12/30/2023)   PRAPARE - Administrator, Civil Service (Medical): No    Lack of Transportation (Non-Medical): No  Physical Activity: Not on file  Stress: Not on file  Social Connections: Not on file  Intimate Partner Violence: Not At Risk (12/30/2023)   Humiliation, Afraid, Rape, and Kick questionnaire    Fear of Current or Ex-Partner: No    Emotionally Abused: No    Physically Abused: No    Sexually Abused: No    No Known Allergies  Family History  Problem Relation Age of Onset   Anxiety disorder Mother    Mental illness Neg Hx    Hypertension Neg Hx       Prior to Admission medications   Medication Sig Start Date End Date Taking? Authorizing Provider  Cholecalciferol   (VITAMIN D3) 50 MCG (2000 UT) capsule TAKE ONE CAPSULE EACH DAY 07/13/20   [provider]  glipiZIDE  (GLUCOTROL ) 10 MG tablet Take 1 tablet (10 mg total) by mouth 2 (two) times daily before a meal. 11/10/20 07/19/23  Celestia Rosaline SQUIBB, NP  LIPITOR 20 MG tablet Take 20 mg by mouth at bedtime. 07/13/20   [provider]  lisinopril  (ZESTRIL ) 5 MG tablet Take 1 tablet (5 mg total) by mouth daily. 11/10/20   Celestia Rosaline SQUIBB, NP  metFORMIN  (GLUCOPHAGE  XR) 500 MG 24 hr tablet Take 1 tablet (500 mg total) by mouth daily with breakfast. 11/10/20   Celestia Rosaline SQUIBB, NP  methadone  (DOLOPHINE ) 1 mg/ml oral solution Take 120 mg by mouth.    [provider]  PROTONIX  20 MG tablet Take 20 mg by mouth daily. 07/13/20   [provider]  QUEtiapine  (SEROQUEL ) 300 MG tablet Take 300 mg by mouth at bedtime.    [provider]  Sofosbuvir -Velpatasvir  (EPCLUSA ) 400-100 MG TABS Take 1 tablet by mouth daily. 08/02/23   Waddell Alan PARAS, RPH-CPP  triamcinolone  (KENALOG ) 0.1 %  07/08/20   [provider]  VISTARIL  50 MG capsule Take 50 mg by mouth 3 (three) times daily as needed. 07/13/20   [provider]    Physical Exam: Vitals:   12/30/23 0545 12/30/23 0546 12/30/23 1011 12/30/23 1205  BP: (!) 143/97  (!) 124/107 (!) 147/92  Pulse: (!) 107  (!) 101 85  Resp:   16 20  Temp:  98.5 F (36.9 C) (!) 97.5 F (36.4 C) 97.6 F (36.4 C)  TempSrc:  Oral Oral Oral  SpO2: 98%  97% 95%     General:  Appears chronically ill-appearing ENT: Oral mucosa dry  Cardiovascular: Tachycardic Respiratory: Clear to auscultation  abdomen: Soft nontender Skin: Chronic appearing psoriatic rash throughout the body noted no inflammation noted  LUE erythema tender Psychiatric: He is awake he thinks he is in a hospital in [place] he was not able to follow any more conversation or commands Neurologic: Moves all extremities Labs on Admission: I have personally reviewed  following labs and imaging studies  CBC: Recent Labs  Lab 12/30/23 0617  WBC 17.7*  NEUTROABS 13.4*  HGB 17.2*  HCT 52.6*  MCV 85.9  PLT 321   Basic Metabolic Panel: Recent Labs  Lab 12/30/23 0617  NA 134*  K 3.8  CL 96*  CO2 25  GLUCOSE 141*  BUN 16  CREATININE 0.77  CALCIUM  9.7   GFR: CrCl cannot be calculated (Unknown ideal weight.). Liver Function Tests: Recent Labs  Lab 12/30/23 0617  AST 15  ALT 14  ALKPHOS 85  BILITOT 0.7  PROT 9.6*  ALBUMIN 4.1   No results for input(s): LIPASE, AMYLASE in the last 168 hours. No results for input(s): AMMONIA in the last 168 hours. Coagulation Profile: Recent Labs  Lab 12/30/23 0812  INR 1.1   Cardiac Enzymes: No results for input(s): CKTOTAL, CKMB, CKMBINDEX, TROPONINI in the last 168 hours. BNP (last 3 results) No results for input(s): PROBNP in the last 8760 hours. HbA1C: No results for input(s): HGBA1C in the last 72 hours. CBG: Recent Labs  Lab 12/30/23 1112  GLUCAP 158*   Lipid Profile: No results for input(s): CHOL, HDL, LDLCALC, TRIG, CHOLHDL, LDLDIRECT in the last 72 hours. Thyroid Function Tests: Recent Labs    12/30/23 1234  TSH 1.312   Anemia Panel: No results for input(s): VITAMINB12, FOLATE, FERRITIN, TIBC, IRON, RETICCTPCT in the last 72 hours. Urine analysis:    Component Value Date/Time   COLORURINE YELLOW 12/30/2023 1145   APPEARANCEUR CLEAR 12/30/2023 1145   LABSPEC >1.046 (H) 12/30/2023 1145   PHURINE 6.0 12/30/2023 1145   GLUCOSEU NEGATIVE 12/30/2023 1145   HGBUR NEGATIVE 12/30/2023 1145   BILIRUBINUR NEGATIVE 12/30/2023 1145   KETONESUR 20 (A) 12/30/2023 1145   PROTEINUR 30 (A) 12/30/2023 1145   UROBILINOGEN 1.0 03/15/2015 2220   NITRITE NEGATIVE 12/30/2023 1145   LEUKOCYTESUR NEGATIVE 12/30/2023 1145    Creatinine Clearance: CrCl cannot be calculated (Unknown ideal weight.).  Sepsis  Labs: @LABRCNTIP (procalcitonin:4,lacticidven:4) ) Recent Results (from the past 240 hours)  Resp panel by RT-PCR (RSV, Flu A&B, Covid) Anterior Nasal Swab     Status: None   Collection Time: 12/30/23 12:20 PM   Specimen: Anterior Nasal Swab  Result Value Ref Range Status   SARS Coronavirus 2 by RT PCR NEGATIVE NEGATIVE Final    Comment: (NOTE) SARS-CoV-2 target nucleic acids are NOT DETECTED.  The SARS-CoV-2 RNA is generally detectable in upper respiratory specimens during the acute phase of infection. The lowest concentration of SARS-CoV-2 viral copies this assay can detect is 138 copies/mL. A negative result does not preclude SARS-Cov-2 infection and should not be used as the sole basis for treatment or other patient management decisions. A negative result may occur with  improper specimen collection/handling, submission of specimen other than nasopharyngeal swab, presence of viral mutation(s) within the areas targeted by this assay, and inadequate number of viral copies(<138 copies/mL). A negative result must be combined with clinical observations, patient history, and epidemiological information. The expected result is Negative.  Fact Sheet for Patients:  BloggerCourse.com  Fact Sheet for Healthcare Providers:  SeriousBroker.it  This test is no t yet approved or cleared by the [place]  FDA and  has been authorized for detection and/or diagnosis of SARS-CoV-2 by FDA under an Emergency Use Authorization (EUA). This EUA will remain  in effect (meaning this test can be used) for the duration of the COVID-19 declaration under Section 564(b)(1) of the Act, 21 U.S.C.section 360bbb-3(b)(1), unless the authorization is terminated  or revoked sooner.       Influenza A by PCR NEGATIVE NEGATIVE Final   Influenza B by PCR NEGATIVE NEGATIVE Final    Comment: (NOTE) The Xpert Xpress SARS-CoV-2/FLU/RSV plus assay is intended as an  aid in the diagnosis of influenza from Nasopharyngeal swab specimens and should not be used as a sole basis for treatment. Nasal washings and aspirates are unacceptable for Xpert Xpress SARS-CoV-2/FLU/RSV testing.  Fact Sheet for Patients: BloggerCourse.com  Fact Sheet for Healthcare Providers: SeriousBroker.it  This test is not yet approved or  cleared by the [place]  FDA and has been authorized for detection and/or diagnosis of SARS-CoV-2 by FDA under an Emergency Use Authorization (EUA). This EUA will remain in effect (meaning this test can be used) for the duration of the COVID-19 declaration under Section 564(b)(1) of the Act, 21 U.S.C. section 360bbb-3(b)(1), unless the authorization is terminated or revoked.     Resp Syncytial Virus by PCR NEGATIVE NEGATIVE Final    Comment: (NOTE) Fact Sheet for Patients: BloggerCourse.com  Fact Sheet for Healthcare Providers: SeriousBroker.it  This test is not yet approved or cleared by the [place]  FDA and has been authorized for detection and/or diagnosis of SARS-CoV-2 by FDA under an Emergency Use Authorization (EUA). This EUA will remain in effect (meaning this test can be used) for the duration of the COVID-19 declaration under Section 564(b)(1) of the Act, 21 U.S.C. section 360bbb-3(b)(1), unless the authorization is terminated or revoked.  Performed at [facility], [address], [place], [place] [address]      Radiological Exams on Admission: CT ABDOMEN PELVIS W CONTRAST Result Date: 12/30/2023 CLINICAL DATA:  Sepsis. EXAM: CT ABDOMEN AND PELVIS WITH CONTRAST TECHNIQUE: Multidetector CT imaging of the abdomen and pelvis was performed using the standard protocol following bolus administration of intravenous contrast. RADIATION DOSE REDUCTION: This exam was performed according to the departmental  dose-optimization program which includes automated exposure control, adjustment of the mA and/or kV according to patient size and/or use of iterative reconstruction technique. CONTRAST:  OMNIPAQUE  IOHEXOL  300 MG/ML  SOLN COMPARISON:  None Available. FINDINGS: Evaluation of this exam is limited due to respiratory motion. Lower chest: The visualized lung bases are clear. No intra-abdominal free air or free fluid. Hepatobiliary: Subcentimeter hypodense lesion in the right lobe of the liver is too small to characterize. No biliary dilatation. The gallbladder is unremarkable. Pancreas: Unremarkable. No pancreatic ductal dilatation or surrounding inflammatory changes. Spleen: Normal in size without focal abnormality. Adrenals/Urinary Tract: The adrenal glands unremarkable. There is a 2 cm left renal inferior pole exophytic cyst. There is no hydronephrosis on either side. The visualized ureters and urinary bladder appear unremarkable. Stomach/Bowel: There is no bowel obstruction or active inflammation. The appendix is normal. Vascular/Lymphatic: Mild aortoiliac atherosclerotic disease. The IVC is unremarkable. No portal venous gas. There is no adenopathy. Reproductive: The prostate and seminal vesicles are grossly remarkable. No pelvic mass. Other: None Musculoskeletal: No acute osseous pathology. IMPRESSION: 1. No acute intra-abdominal or pelvic pathology. 2.  Aortic Atherosclerosis (ICD10-I70.0). Electronically Signed   By: Vanetta Chou M.D.   On: 12/30/2023 10:42   CT Head Wo Contrast Result Date: 12/30/2023 EXAM: CT HEAD WITHOUT CONTRAST 12/30/2023 09:57:07 AM TECHNIQUE: CT of the head was performed without the administration of intravenous contrast. Automated exposure control, iterative reconstruction, and/or weight based adjustment of the mA/kV was utilized to reduce the radiation dose to as low as reasonably achievable. COMPARISON: CT head without contrast 05/29/2006. CLINICAL HISTORY: Mental status  change, unknown cause. Patient brought to ED by father for evaluation of hallucinating and coming off methadone . Father reports last dose of methadone  was yesterday. FINDINGS: BRAIN AND VENTRICLES: No acute hemorrhage. Gray-Bayliss differentiation is preserved. No hydrocephalus. No extra-axial collection. No mass effect or midline shift. ORBITS: No acute abnormality. SINUSES: No acute abnormality. SOFT TISSUES AND SKULL: No acute soft tissue abnormality. No skull fracture. IMPRESSION: 1. No acute intracranial abnormality. Electronically signed by: Lonni Necessary MD 12/30/2023 10:18 AM EDT RP Workstation: [id number]   DG Chest Port 1  View Result Date: 12/30/2023 CLINICAL DATA:  Questionable sepsis. EXAM: PORTABLE CHEST 1 VIEW COMPARISON:  Chest radiograph dated 03/24/2017. FINDINGS: The heart size and mediastinal contours are within normal limits. Both lungs are clear. The visualized skeletal structures are unremarkable. IMPRESSION: No active disease. Electronically Signed   By: Vanetta Chou M.D.   On: 12/30/2023 09:21    ZXH:[id number] qt 458 (on methadone ) Assessment/Plan Principal Problem:   Acute encephalopathy Active Problems:   Opiate dependence, continuous (HCC)   Psoriasis   Cellulitis of left arm   Hyponatremia   #1 acute encephalopathy patient admitted with new onset hallucination in the setting of history of IV drug use hepatitis C, dehydration from nausea vomiting, cellulitis of left upper extremity and methadone  use.  Apparently he has been off of methadone  for a week or couple of days which I am not sure is correct.  He is not able to tell me who stopped his methadone .  I am not sure of his baseline mental status either at this time.  Will need to check with his dad again.  Currently he is aware he is in the hospital but he thinks he is in [place] that is the best she could tell me.  He was admitted with hallucination visual hallucination he saw or seeing people that was not  there.  Initially a code sepsis was alerted due to tachycardia leukocytosis and change in mental status.  He was afebrile.  He has some erythema to the left upper extremity.  He received vancomycin  Flagyl  and cefepime  in the ER. Check ammonia level IV fluids Rocephin  Continue methadone   #2 left upper extremity cellulitis warm erythematous start Rocephin  tomorrow since she has received antibiotics as above in the ED.  #3 dehydration from nausea vomiting and poor p.o. intake.  IV fluids overnight and reassess in AM.  UA shows 20 ketones.  Oral mucosa is dry.  #4 type 2 diabetes hyperglycemia with ketones in the urine with normal gap.  On metformin  and glipizide  restarted.  Cover him with sliding scale insulin . Check A1c this admission.  #5 psoriasis continue prior to admission Kenalog .  #6 history of hepatitis C.  Chart review shows he finished or completed 12 weeks of Epclusa .  Although he has missed 1 or 2 doses. Hep C RNA undetectable as of April 2025.  #7 hyperlipidemia on statin His med list have not yet been reconciled during this dictation.  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  Estimated body mass index is 34.17 kg/m as calculated from the following:   Height as of 07/19/23: 5' 11 (1.803 m).   Weight as of 07/19/23: 111.1 kg.   DVT prophylaxis: Lovenox  Code Status: Full code Family Communication: Called patient's father went to voicemail Disposition Plan: Pending clinical progress Consults called: None Admission status: Inpatient   Almarie KANDICE Hoots MD [facility]  12/30/2023, 2:22 PM

## 2023-12-30 NOTE — Plan of Care (Signed)
   Problem: Health Behavior/Discharge Planning: Goal: Ability to manage health-related needs will improve Outcome: Progressing   Problem: Clinical Measurements: Goal: Ability to maintain clinical measurements within normal limits will improve Outcome: Progressing Goal: Will remain free from infection Outcome: Progressing Goal: Diagnostic test results will improve Outcome: Progressing
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
## 2023-12-31 DIAGNOSIS — G9340 Encephalopathy, unspecified: Secondary | ICD-10-CM

## 2023-12-31 LAB — RPR: RPR Ser Ql: NONREACTIVE

## 2023-12-31 LAB — BASIC METABOLIC PANEL WITH GFR
Anion gap: 13 (ref 5–15)
BUN: 11 mg/dL (ref 6–20)
CO2: 24 mmol/L (ref 22–32)
Calcium: 9.3 mg/dL (ref 8.9–10.3)
Chloride: 94 mmol/L — ABNORMAL LOW (ref 98–111)
Creatinine, Ser: 0.73 mg/dL (ref 0.61–1.24)
GFR, Estimated: >60 mL/min (ref >60)
Glucose, Bld: 139 mg/dL — ABNORMAL HIGH (ref 70–99)
Potassium: 3.8 mmol/L (ref 3.5–5.1)
Sodium: 131 mmol/L — ABNORMAL LOW (ref 135–145)

## 2023-12-31 LAB — CBC
HCT: 47.7 % (ref 39.0–52.0)
Hemoglobin: 15.5 g/dL (ref 13.0–17.0)
MCH: 27.7 pg (ref 26.0–34.0)
MCHC: 32.5 g/dL (ref 30.0–36.0)
MCV: 85.3 fL (ref 80.0–100.0)
Platelets: 276 K/uL (ref 150–400)
RBC: 5.59 MIL/uL (ref 4.22–5.81)
RDW: 13.3 % (ref 11.5–15.5)
WBC: 16 K/uL — ABNORMAL HIGH (ref 4.0–10.5)
nRBC: 0 % (ref 0.0–0.2)

## 2023-12-31 LAB — GLUCOSE, CAPILLARY
Glucose-Capillary: 137 mg/dL — ABNORMAL HIGH (ref 70–99)
Glucose-Capillary: 111 mg/dL — ABNORMAL HIGH (ref 70–99)
Glucose-Capillary: 110 mg/dL — ABNORMAL HIGH (ref 70–99)
Glucose-Capillary: 111 mg/dL — ABNORMAL HIGH (ref 70–99)

## 2023-12-31 LAB — RAPID URINE DRUG SCREEN, HOSP PERFORMED
Amphetamines: POSITIVE — AB
Amphetamines: POSITIVE — AB
Barbiturates: NOT DETECTED
Barbiturates: NOT DETECTED
Benzodiazepines: POSITIVE — AB
Benzodiazepines: POSITIVE — AB
Cocaine: NOT DETECTED
Cocaine: NOT DETECTED
Opiates: NOT DETECTED
Opiates: NOT DETECTED
Tetrahydrocannabinol: POSITIVE — AB
Tetrahydrocannabinol: POSITIVE — AB

## 2023-12-31 LAB — BLOOD CULTURE ID PANEL (REFLEXED) - BCID2

## 2023-12-31 LAB — MRSA NEXT GEN BY PCR, NASAL: MRSA by PCR Next Gen: NOT DETECTED

## 2023-12-31 MED ORDER — VANCOMYCIN HCL 2000 MG/400ML IV SOLN
2000.0000 mg | Freq: Once | INTRAVENOUS | Status: AC
  Administered 2023-12-31: 2000 mg via INTRAVENOUS
  Filled 2023-12-31: qty 400

## 2023-12-31 MED ORDER — NICOTINE 14 MG/24HR TD PT24
14.0000 mg | MEDICATED_PATCH | Freq: Every day | TRANSDERMAL | Status: DC
  Administered 2023-12-31 – 2024-01-03 (×4): 14 mg via TRANSDERMAL
  Filled 2023-12-31 (×4): qty 1

## 2023-12-31 MED ORDER — METHADONE HCL 5 MG PO TABS
50.0000 mg | ORAL_TABLET | Freq: Every day | ORAL | Status: DC
  Administered 2024-01-01 – 2024-01-04 (×4): 50 mg via ORAL
  Filled 2023-12-31: qty 5
  Filled 2023-12-31 (×3): qty 10

## 2023-12-31 MED ORDER — VANCOMYCIN HCL 1500 MG/300ML IV SOLN
1500.0000 mg | Freq: Two times a day (BID) | INTRAVENOUS | Status: DC
  Administered 2023-12-31 – 2024-01-02 (×5): 1500 mg via INTRAVENOUS
  Filled 2023-12-31 (×6): qty 300

## 2023-12-31 MED ORDER — DEXMEDETOMIDINE HCL IN NACL 400 MCG/100ML IV SOLN
0.0000 ug/kg/h | INTRAVENOUS | Status: DC
  Administered 2023-12-31: 0.8 ug/kg/h via INTRAVENOUS
  Administered 2023-12-31: 0.7 ug/kg/h via INTRAVENOUS
  Administered 2023-12-31: 0.8 ug/kg/h via INTRAVENOUS
  Administered 2023-12-31: 0.5 ug/kg/h via INTRAVENOUS
  Administered 2024-01-01: 0.6 ug/kg/h via INTRAVENOUS
  Administered 2024-01-01: 0.8 ug/kg/h via INTRAVENOUS
  Filled 2023-12-31 (×6): qty 100

## 2023-12-31 MED ORDER — VANCOMYCIN HCL 1750 MG/350ML IV SOLN: 1750.0000 mg | Freq: Two times a day (BID) | INTRAVENOUS | Status: DC

## 2023-12-31 MED ORDER — SODIUM CHLORIDE 0.9% FLUSH: 10.0000 mL | INTRAVENOUS | Status: DC | PRN

## 2023-12-31 MED ORDER — CHLORHEXIDINE GLUCONATE CLOTH 2 % EX PADS
6.0000 | MEDICATED_PAD | Freq: Every day | CUTANEOUS | Status: DC
  Administered 2024-01-01 – 2024-01-04 (×4): 6 via TOPICAL
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
## 2023-12-31 NOTE — Progress Notes (Addendum)
 PROGRESS NOTE    [name]  FMW:[id number] DOB: 25-Feb-1981 DOA: 12/30/2023 PCP: Celestia Rosaline SQUIBB, NP   Brief Narrative:  [name] is a 43 y.o. male with medical history significant of psoriasis, IV drug use, hepatitis C, GERD, heroin addiction, left abscesses who was brought in by his father complaining that the patient is hallucinating now that he is off of his methadone . Per patient (now that he is more awake and alert and oriented) confirms he only missed 1 to 2 days of methadone  and denies other drug use or medication changes.  Assessment & Plan:   Principal Problem:   Acute encephalopathy Active Problems:   Opiate dependence, continuous (HCC)   Psoriasis   Cellulitis of left arm   Hyponatremia   Toxic encephalopathy, multifactorial Hallucinations, resolved Rule out metabolic component Rule out substance abuse psychotic disorder/delusional disorder - Patient off methadone  only for 1 to 2 days per his own report - UDS positive for amphetamines, THC and benzos. None of these are active prescriptions per chart review - Continue home methadone  only(57 mg daily -carry 50 mg tablets here) - Continue Precedex , currently well-controlled - History of substance abuse psychotic disorder noted in the chart, distant history mentions schizophrenia but unclear if this diagnosis was ever made or simply presumed - No indication for psychiatry evaluation at this time given improvement over the past 24 hours.  Should patient continue to have delusions/hallucinations despite appropriate medical management we will certainly reach out - No indication at this time for IVC, previous orders discontinued  Suspected sepsis, multifactorial Left upper extremity cellulitis warm erythematous  Multiple skin lesions/breakdown in the setting of psoriasis, uncontrolled  Rule out bacteremia - Continue vancomycin , discontinue cephalosporin - BC ID positive for staph -possible contaminant, given  history of IV drug abuse, multiple skin lesions in the setting of uncontrolled psoriasis patient is high risk for infection - If cultures continue to result positive for staph we will follow-up with ID for echo/TEE and possible need for prolonged antibiotics given his history he would likely not be a safe discharge with IV access  Hypovolemic hyponatremia  Profound dehydration from nausea vomiting and poor p.o. intake.  - Continue IV fluids until p.o. intake is more appropriate -Urine output improving, without overt AKI   Type 2 diabetes controlled Ketonuria -Continue sliding scale insulin , hypoglycemic protocol -A1c 5.5   Uncontrolled Psoriasis continue prior to admission Kenalog ; not on typical medications/biologic/dmards likely due to poor follow up (no history of . Continue supportive care.   History of hepatitis C.  Completed 12 weeks of Epclusa .  Although he has missed 1 or 2 doses. Hep C RNA undetectable as of 11/2023.   Hyperlipidemia on statin Hold nonessential medications at this timeProlonged QT on methadone  440-follow repeat EKG, continue telemetry   DVT prophylaxis: enoxaparin  (LOVENOX ) injection 40 mg Start: 12/31/23 1000 Code Status:   Code Status: Full Code Family Communication: None present  Status is: Inpatient  Dispo: The patient is from: Home              Anticipated d/c is to: To be determined               Anticipated d/c date is:  to be determined               Patient currently not medically stable for discharge  Consultants:  None  Procedures:  None  Antimicrobials:  Vancomycin   Subjective: No acute issues or events this morning, patient's mental  status appears to be improving, pleasantly resting in bed with no acute complaints other than feeling jittery -the any recent nausea vomiting diarrhea constipation headache fevers chills chest pain sick contacts or travel.  Denies any recent medication changes.  Objective: Vitals:   12/31/23 0500  12/31/23 0515 12/31/23 0530 12/31/23 1151  BP: 107/65 115/78 (!) 100/58   Pulse: 65 (!) 51 (!) 59   Resp: 18 18 15    Temp:    98.1 F (36.7 C)  TempSrc:    Oral  SpO2: (!) 89% (!) 89% 90%   Weight:      Height:        Intake/Output Summary (Last 24 hours) at 12/31/2023 1504 Last data filed at 12/31/2023 1236 Gross per 24 hour  Intake 997.07 ml  Output 550 ml  Net 447.07 ml   Filed Weights   12/30/23 1839  Weight: 111.1 kg    Examination:  General:  Pleasantly resting in bed, No acute distress but does appear to be somewhat anxious HEENT:  Normocephalic atraumatic.  Sclerae nonicteric, noninjected.  Extraocular movements intact bilaterally. Neck:  Without mass or deformity.  Trachea is midline. Lungs:  Clear to auscultate bilaterally without rhonchi, wheeze, or rales. Heart: Without obvious murmur Abdomen:  Soft, nontender, nondistended.  Without guarding or rebound. Extremities: Without cyanosis, clubbing, edema Skin: Diffuse psoriasis lesions most notably over extensor surfaces of legs and arms.  No overt signs of ecchymosis bleeding or injection site.  Data Reviewed: I have personally reviewed following labs and imaging studies  CBC: Recent Labs  Lab 12/30/23 0617 12/31/23 0948  WBC 17.7* 16.0*  NEUTROABS 13.4*  --   HGB 17.2* 15.5  HCT 52.6* 47.7  MCV 85.9 85.3  PLT 321 276   Basic Metabolic Panel: Recent Labs  Lab 12/30/23 0617 12/31/23 0948  NA 134* 131*  K 3.8 3.8  CL 96* 94*  CO2 25 24  GLUCOSE 141* 139*  BUN 16 11  CREATININE 0.77 0.73  CALCIUM  9.7 9.3   GFR: Estimated Creatinine Clearance: 150.9 mL/min (by C-G formula based on SCr of 0.73 mg/dL). Liver Function Tests: Recent Labs  Lab 12/30/23 0617  AST 15  ALT 14  ALKPHOS 85  BILITOT 0.7  PROT 9.6*  ALBUMIN 4.1   No results for input(s): LIPASE, AMYLASE in the last 168 hours. Recent Labs  Lab 12/30/23 1457  AMMONIA 41*   Coagulation Profile: Recent Labs  Lab  12/30/23 0812  INR 1.1   HbA1C: Recent Labs    12/30/23 1457  HGBA1C 5.5   CBG: Recent Labs  Lab 12/30/23 1112 12/30/23 1704 12/30/23 2123 12/31/23 0800 12/31/23 1151  GLUCAP 158* 132* 235* 137* 111*   Lipid Profile: No results for input(s): CHOL, HDL, LDLCALC, TRIG, CHOLHDL, LDLDIRECT in the last 72 hours. Thyroid Function Tests: Recent Labs    12/30/23 1234  TSH 1.312   Anemia Panel: No results for input(s): VITAMINB12, FOLATE, FERRITIN, TIBC, IRON, RETICCTPCT in the last 72 hours. Sepsis Labs: Recent Labs  Lab 12/30/23 9185  LATICACIDVEN 1.1    Recent Results (from the past 240 hours)  Blood Culture (routine x 2)     Status: None (Preliminary result)   Collection Time: 12/30/23  8:13 AM   Specimen: BLOOD  Result Value Ref Range Status   Specimen Description   Final    BLOOD BLOOD LEFT FOREARM Performed at [facility], [address], [place], [place] [address]    Special Requests   Final  BOTTLES DRAWN AEROBIC ONLY Blood Culture results may not be optimal due to an inadequate volume of blood received in culture bottles Performed at [facility], [address] [place], [place] [address]    Culture  Setup Time   Final    GRAM POSITIVE COCCI IN CLUSTERS AEROBIC BOTTLE ONLY CRITICAL VALUE NOTED.  VALUE IS CONSISTENT WITH PREVIOUSLY REPORTED AND CALLED VALUE. Performed at [facility], [address], [place], [place] [address]    Culture GRAM POSITIVE COCCI  Final   Report Status PENDING  Incomplete  Blood Culture (routine x 2)     Status: None (Preliminary result)   Collection Time: 12/30/23  8:14 AM   Specimen: BLOOD  Result Value Ref Range Status   Specimen Description   Final    BLOOD RIGHT ANTECUBITAL Performed at [facility], [address] [place], [place] [address]    Special Requests   Final    BOTTLES DRAWN AEROBIC AND ANAEROBIC Blood Culture  adequate volume Performed at [facility], [address], [place], [place] [address]    Culture  Setup Time   Final    GRAM POSITIVE COCCI IN CLUSTERS ANAEROBIC BOTTLE ONLY CRITICAL RESULT CALLED TO, Mays BACK BY AND VERIFIED WITH: PHARMD N GLOGOVAC 12/31/2023 @ 0613 BY AB Performed at [facility], [address], [place], [place] [address]    Culture GRAM POSITIVE COCCI  Final   Report Status PENDING  Incomplete  Blood Culture ID Panel (Reflexed)     Status: Abnormal   Collection Time: 12/30/23  8:14 AM  Result Value Ref Range Status   Enterococcus faecalis NOT DETECTED NOT DETECTED Final   Enterococcus Faecium NOT DETECTED NOT DETECTED Final   Listeria monocytogenes NOT DETECTED NOT DETECTED Final   Staphylococcus species DETECTED (A) NOT DETECTED Final    Comment: CRITICAL RESULT CALLED TO, Mays BACK BY AND VERIFIED WITH: PHARMD N GLOGOVAC 12/31/2023 @ 0613 BY AB    Staphylococcus aureus (BCID) NOT DETECTED NOT DETECTED Final   Staphylococcus epidermidis NOT DETECTED NOT DETECTED Final   Staphylococcus lugdunensis NOT DETECTED NOT DETECTED Final   Streptococcus species NOT DETECTED NOT DETECTED Final   Streptococcus agalactiae NOT DETECTED NOT DETECTED Final   Streptococcus pneumoniae NOT DETECTED NOT DETECTED Final   Streptococcus pyogenes NOT DETECTED NOT DETECTED Final   A.calcoaceticus-baumannii NOT DETECTED NOT DETECTED Final   Bacteroides fragilis NOT DETECTED NOT DETECTED Final   Enterobacterales NOT DETECTED NOT DETECTED Final   Enterobacter cloacae complex NOT DETECTED NOT DETECTED Final   Escherichia coli NOT DETECTED NOT DETECTED Final   Klebsiella aerogenes NOT DETECTED NOT DETECTED Final   Klebsiella oxytoca NOT DETECTED NOT DETECTED Final   Klebsiella pneumoniae NOT DETECTED NOT DETECTED Final   Proteus species NOT DETECTED NOT DETECTED Final   Salmonella species NOT DETECTED NOT DETECTED Final   Serratia marcescens NOT DETECTED NOT  DETECTED Final   Haemophilus influenzae NOT DETECTED NOT DETECTED Final   Neisseria meningitidis NOT DETECTED NOT DETECTED Final   Pseudomonas aeruginosa NOT DETECTED NOT DETECTED Final   Stenotrophomonas maltophilia NOT DETECTED NOT DETECTED Final   Candida albicans NOT DETECTED NOT DETECTED Final   Candida auris NOT DETECTED NOT DETECTED Final   Candida glabrata NOT DETECTED NOT DETECTED Final   Candida krusei NOT DETECTED NOT DETECTED Final   Candida parapsilosis NOT DETECTED NOT DETECTED Final   Candida tropicalis NOT DETECTED NOT DETECTED Final   Cryptococcus neoformans/gattii  NOT DETECTED NOT DETECTED Final    Comment: Performed at [facility], [address] [place], [place] [address]  Resp panel by RT-PCR (RSV, Flu A&B, Covid) Anterior Nasal Swab     Status: None   Collection Time: 12/30/23 12:20 PM   Specimen: Anterior Nasal Swab  Result Value Ref Range Status   SARS Coronavirus 2 by RT PCR NEGATIVE NEGATIVE Final    Comment: (NOTE) SARS-CoV-2 target nucleic acids are NOT DETECTED.  The SARS-CoV-2 RNA is generally detectable in upper respiratory specimens during the acute phase of infection. The lowest concentration of SARS-CoV-2 viral copies this assay can detect is 138 copies/mL. A negative result does not preclude SARS-Cov-2 infection and should not be used as the sole basis for treatment or other patient management decisions. A negative result may occur with  improper specimen collection/handling, submission of specimen other than nasopharyngeal swab, presence of viral mutation(s) within the areas targeted by this assay, and inadequate number of viral copies(<138 copies/mL). A negative result must be combined with clinical observations, patient history, and epidemiological information. The expected result is Negative.  Fact Sheet for Patients:  BloggerCourse.com  Fact Sheet for Healthcare Providers:   SeriousBroker.it  This test is no t yet approved or cleared by the [place]  FDA and  has been authorized for detection and/or diagnosis of SARS-CoV-2 by FDA under an Emergency Use Authorization (EUA). This EUA will remain  in effect (meaning this test can be used) for the duration of the COVID-19 declaration under Section 564(b)(1) of the Act, 21 U.S.C.section 360bbb-3(b)(1), unless the authorization is terminated  or revoked sooner.       Influenza A by PCR NEGATIVE NEGATIVE Final   Influenza B by PCR NEGATIVE NEGATIVE Final    Comment: (NOTE) The Xpert Xpress SARS-CoV-2/FLU/RSV plus assay is intended as an aid in the diagnosis of influenza from Nasopharyngeal swab specimens and should not be used as a sole basis for treatment. Nasal washings and aspirates are unacceptable for Xpert Xpress SARS-CoV-2/FLU/RSV testing.  Fact Sheet for Patients: BloggerCourse.com  Fact Sheet for Healthcare Providers: SeriousBroker.it  This test is not yet approved or cleared by the [place]  FDA and has been authorized for detection and/or diagnosis of SARS-CoV-2 by FDA under an Emergency Use Authorization (EUA). This EUA will remain in effect (meaning this test can be used) for the duration of the COVID-19 declaration under Section 564(b)(1) of the Act, 21 U.S.C. section 360bbb-3(b)(1), unless the authorization is terminated or revoked.     Resp Syncytial Virus by PCR NEGATIVE NEGATIVE Final    Comment: (NOTE) Fact Sheet for Patients: BloggerCourse.com  Fact Sheet for Healthcare Providers: SeriousBroker.it  This test is not yet approved or cleared by the [place]  FDA and has been authorized for detection and/or diagnosis of SARS-CoV-2 by FDA under an Emergency Use Authorization (EUA). This EUA will remain in effect (meaning this test can be used) for  the duration of the COVID-19 declaration under Section 564(b)(1) of the Act, 21 U.S.C. section 360bbb-3(b)(1), unless the authorization is terminated or revoked.  Performed at [facility], [address] [place], [place] [address]   MRSA Next Gen by PCR, Nasal     Status: None   Collection Time: 12/30/23 10:44 PM   Specimen: Nasal Mucosa; Nasal Swab  Result Value Ref Range Status   MRSA by PCR Next Gen NOT DETECTED NOT DETECTED Final    Comment: (NOTE) The GeneXpert MRSA Assay (FDA approved for NASAL  specimens only), is one component of a comprehensive MRSA colonization surveillance program. It is not intended to diagnose MRSA infection nor to guide or monitor treatment for MRSA infections. Test performance is not FDA approved in patients less than 52 years old. Performed at [facility], [address], [place], [place] [address]          Radiology Studies: CT ABDOMEN PELVIS W CONTRAST Result Date: 12/30/2023 CLINICAL DATA:  Sepsis. EXAM: CT ABDOMEN AND PELVIS WITH CONTRAST TECHNIQUE: Multidetector CT imaging of the abdomen and pelvis was performed using the standard protocol following bolus administration of intravenous contrast. RADIATION DOSE REDUCTION: This exam was performed according to the departmental dose-optimization program which includes automated exposure control, adjustment of the mA and/or kV according to patient size and/or use of iterative reconstruction technique. CONTRAST:  100mL OMNIPAQUE  IOHEXOL  300 MG/ML  SOLN COMPARISON:  None Available. FINDINGS: Evaluation of this exam is limited due to respiratory motion. Lower chest: The visualized lung bases are clear. No intra-abdominal free air or free fluid. Hepatobiliary: Subcentimeter hypodense lesion in the right lobe of the liver is too small to characterize. No biliary dilatation. The gallbladder is unremarkable. Pancreas: Unremarkable. No pancreatic ductal dilatation or  surrounding inflammatory changes. Spleen: Normal in size without focal abnormality. Adrenals/Urinary Tract: The adrenal glands unremarkable. There is a 2 cm left renal inferior pole exophytic cyst. There is no hydronephrosis on either side. The visualized ureters and urinary bladder appear unremarkable. Stomach/Bowel: There is no bowel obstruction or active inflammation. The appendix is normal. Vascular/Lymphatic: Mild aortoiliac atherosclerotic disease. The IVC is unremarkable. No portal venous gas. There is no adenopathy. Reproductive: The prostate and seminal vesicles are grossly remarkable. No pelvic mass. Other: None Musculoskeletal: No acute osseous pathology. IMPRESSION: 1. No acute intra-abdominal or pelvic pathology. 2.  Aortic Atherosclerosis (ICD10-I70.0). Electronically Signed   By: Vanetta Chou M.D.   On: 12/30/2023 10:42   CT Head Wo Contrast Result Date: 12/30/2023 EXAM: CT HEAD WITHOUT CONTRAST 12/30/2023 09:57:07 AM TECHNIQUE: CT of the head was performed without the administration of intravenous contrast. Automated exposure control, iterative reconstruction, and/or weight based adjustment of the mA/kV was utilized to reduce the radiation dose to as low as reasonably achievable. COMPARISON: CT head without contrast 05/29/2006. CLINICAL HISTORY: Mental status change, unknown cause. Patient brought to ED by father for evaluation of hallucinating and coming off methadone . Father reports last dose of methadone  was yesterday. FINDINGS: BRAIN AND VENTRICLES: No acute hemorrhage. Gray-Gorton differentiation is preserved. No hydrocephalus. No extra-axial collection. No mass effect or midline shift. ORBITS: No acute abnormality. SINUSES: No acute abnormality. SOFT TISSUES AND SKULL: No acute soft tissue abnormality. No skull fracture. IMPRESSION: 1. No acute intracranial abnormality. Electronically signed by: Lonni Necessary MD 12/30/2023 10:18 AM EDT RP Workstation: [id number]   DG Chest Port  1 View Result Date: 12/30/2023 CLINICAL DATA:  Questionable sepsis. EXAM: PORTABLE CHEST 1 VIEW COMPARISON:  Chest radiograph dated 03/24/2017. FINDINGS: The heart size and mediastinal contours are within normal limits. Both lungs are clear. The visualized skeletal structures are unremarkable. IMPRESSION: No active disease. Electronically Signed   By: Vanetta Chou M.D.   On: 12/30/2023 09:21        Scheduled Meds:  atorvastatin   20 mg Oral QHS   Chlorhexidine  Gluconate Cloth  6 each Topical Daily   enoxaparin  (LOVENOX ) injection  40 mg Subcutaneous Q24H   glipiZIDE   10 mg Oral BID AC   insulin  aspart  0-9 Units Subcutaneous TID WC  lisinopril   5 mg Oral Daily   [START ON 01/01/2024] metFORMIN   500 mg Oral Q breakfast   [START ON 01/01/2024] methadone   50 mg Oral Daily   pantoprazole   20 mg Oral Daily   QUEtiapine   300 mg Oral QHS   triamcinolone  cream   Topical TID   Continuous Infusions:  sodium chloride  Stopped (12/30/23 1817)   dexmedetomidine  (PRECEDEX ) IV infusion 0.8 mcg/kg/hr (12/31/23 1236)   vancomycin        LOS: 1 day   Time spent:  William JAYSON Montclair, DO [facility]  If 7PM-7AM, please contact night-coverage [email or web address]  12/31/2023, 3:04 PM

## 2023-12-31 NOTE — Progress Notes (Signed)
 PHARMACY - PHYSICIAN COMMUNICATION CRITICAL VALUE ALERT - BLOOD CULTURE IDENTIFICATION (BCID)  [name] is an 43 y.o. male who presented to [facility] on 12/30/2023 with a chief complaint of  Chief Complaint  Patient presents with   Psychiatric Evaluation   Hallucinations     Assessment:  2/3 showing Staph species showing no resistance   Name of physician (or Provider) Contacted: Dr. Lue  Current antibiotics: Ceftriaxone   Changes to prescribed antibiotics recommended:  - Stop Ceftriaxone  - Start vancomycin  2000 mg IV x1 then vancomycin  1750 mg IV q12h ( AUC 447, wt 111.1 kg, CrCl for calc 110 ml/min)    Results for orders placed or performed during the hospital encounter of 12/30/23  Blood Culture ID Panel (Reflexed) (Collected: 12/30/2023  8:14 AM)  Result Value Ref Range   Enterococcus faecalis NOT DETECTED NOT DETECTED   Enterococcus Faecium NOT DETECTED NOT DETECTED   Listeria monocytogenes NOT DETECTED NOT DETECTED   Staphylococcus species DETECTED (A) NOT DETECTED   Staphylococcus aureus (BCID) NOT DETECTED NOT DETECTED   Staphylococcus epidermidis NOT DETECTED NOT DETECTED   Staphylococcus lugdunensis NOT DETECTED NOT DETECTED   Streptococcus species NOT DETECTED NOT DETECTED   Streptococcus agalactiae NOT DETECTED NOT DETECTED   Streptococcus pneumoniae NOT DETECTED NOT DETECTED   Streptococcus pyogenes NOT DETECTED NOT DETECTED   A.calcoaceticus-baumannii NOT DETECTED NOT DETECTED   Bacteroides fragilis NOT DETECTED NOT DETECTED   Enterobacterales NOT DETECTED NOT DETECTED   Enterobacter cloacae complex NOT DETECTED NOT DETECTED   Escherichia coli NOT DETECTED NOT DETECTED   Klebsiella aerogenes NOT DETECTED NOT DETECTED   Klebsiella oxytoca NOT DETECTED NOT DETECTED   Klebsiella pneumoniae NOT DETECTED NOT DETECTED   Proteus species NOT DETECTED NOT DETECTED   Salmonella species NOT DETECTED NOT DETECTED   Serratia marcescens NOT DETECTED NOT  DETECTED   Haemophilus influenzae NOT DETECTED NOT DETECTED   Neisseria meningitidis NOT DETECTED NOT DETECTED   Pseudomonas aeruginosa NOT DETECTED NOT DETECTED   Stenotrophomonas maltophilia NOT DETECTED NOT DETECTED   Candida albicans NOT DETECTED NOT DETECTED   Candida auris NOT DETECTED NOT DETECTED   Candida glabrata NOT DETECTED NOT DETECTED   Candida krusei NOT DETECTED NOT DETECTED   Candida parapsilosis NOT DETECTED NOT DETECTED   Candida tropicalis NOT DETECTED NOT DETECTED   Cryptococcus neoformans/gattii NOT DETECTED NOT DETECTED    Dolphus Roller, PharmD, BCPS 12/31/2023 7:53 AM
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
## 2023-12-31 NOTE — TOC Initial Note (Signed)
 Transition of Care [facility]) - Initial/Assessment Note    Patient Details  Name: [name] MRN: [id number] Date of Birth: 1980-08-18  Transition [facility] Care [facility]) CM/SW Contact:    William ONEIDA Anon, RN Phone Number: 12/31/2023, 1:16 PM  Clinical Narrative:                 Pt is from home with relative. Pt father [name] is POC at [phone]. NCM received secure chat stating pt needed IVC paperwork filled out. NCM notified MD he would need to fill out paperwork and NCM would complete the rest of IVC process. MD states pt no longer needing to be IVC'd and will discontinue IVC order. Pt is requiring sitter for safety. Continued medical workup, not medically stable for DC. Care management will continue to follow for any new recommendations or DC needs.  Expected Discharge Plan:  (TBD) Barriers to Discharge: Requiring sitter/restraints, Continued Medical Work up   Patient Goals and CMS Choice Patient states their goals for this hospitalization and ongoing recovery are:: Home CMS Medicare.gov Compare Post Acute Care list provided to:: Other (Comment Required) (NA) Choice offered to / list presented to : NA [place] [facility] interest [facility].provided to:: Parent NA    Expected Discharge Plan and Services In-house Referral: NA Discharge Planning Services: CM Consult Post Acute Care Choice:  (TBD) Living arrangements for the past 2 months: Single Family Home                 DME Arranged: N/A DME Agency: NA       HH Arranged: NA HH Agency: NA        Prior Living Arrangements/Services Living arrangements for the past 2 months: Single Family Home Lives with:: Relatives Patient language and need for interpreter reviewed:: Yes Do you feel safe going back to the place where you live?: Yes      Need for Family Participation in Patient Care: No (Comment) Care giver support system in place?: No (comment) Current home services: Other (comment) (NA) Criminal  Activity/Legal Involvement Pertinent to Current Situation/Hospitalization: No - Comment as needed  Activities of Daily Living   ADL Screening (condition at time of admission) Independently performs ADLs?: Yes (appropriate for developmental age) Is the patient deaf or have difficulty hearing?: No Does the patient have difficulty seeing, even when wearing glasses/contacts?: No Does the patient have difficulty concentrating, remembering, or making decisions?: No  Permission Sought/Granted Permission sought to share information with : Family Supports Permission granted to share information with : Yes, Verbal Permission Granted  Share Information with NAME: [name], [name] (Father)  [phone]           Emotional Assessment Appearance:: Appears stated age Attitude/Demeanor/Rapport: Unable to Assess Affect (typically observed): Unable to Assess Orientation: : Oriented to Self, Oriented to Place Alcohol / Substance Use: Not Applicable Psych Involvement: No (comment)  Admission diagnosis:  Acute encephalopathy [G93.40] Altered mental status, unspecified altered mental status type [R41.82] Patient Active Problem List   Diagnosis Date Noted   Acute encephalopathy 12/30/2023   Chronic hepatitis C with hepatic coma (HCC) 08/16/2022   Cellulitis of left arm 03/25/2017   Bacteremia 03/25/2017   IV drug abuse (HCC) 03/25/2017   Hyponatremia 03/25/2017   Abscess of arm, left    Hyperprolactinemia (HCC) 03/19/2015   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 [place] [address] Phone: [phone] Fax: [phone]     Social Drivers of Health (SDOH) Social History: SDOH Screenings   Food Insecurity: No Food Insecurity (12/30/2023)  Housing: Low Risk  (12/30/2023)  Transportation Needs: No Transportation Needs (12/30/2023)  Utilities: Not At Risk (12/30/2023)  Depression (PHQ2-9): Low Risk  (08/16/2022)  Tobacco Use: High Risk (12/30/2023)   SDOH Interventions:     Readmission Risk Interventions    12/31/2023    1:09 PM  Readmission Risk Prevention Plan  Transportation Screening Complete  PCP or Specialist Appt within 5-7 Days Complete  Home Care Screening Complete  Medication Review (RN CM) Complete

## 2024-01-01 ENCOUNTER — Other Ambulatory Visit: Payer: Self-pay

## 2024-01-01 DIAGNOSIS — G9340 Encephalopathy, unspecified: Secondary | ICD-10-CM | POA: Diagnosis not present

## 2024-01-01 LAB — CBC
HCT: 44.5 % (ref 39.0–52.0)
Hemoglobin: 14.8 g/dL (ref 13.0–17.0)
MCH: 28.5 pg (ref 26.0–34.0)
MCHC: 33.3 g/dL (ref 30.0–36.0)
MCV: 85.7 fL (ref 80.0–100.0)
Platelets: 201 K/uL (ref 150–400)
RBC: 5.19 MIL/uL (ref 4.22–5.81)
RDW: 13.2 % (ref 11.5–15.5)
WBC: 14.2 K/uL — ABNORMAL HIGH (ref 4.0–10.5)
nRBC: 0 % (ref 0.0–0.2)

## 2024-01-01 LAB — GLUCOSE, CAPILLARY
Glucose-Capillary: 126 mg/dL — ABNORMAL HIGH (ref 70–99)
Glucose-Capillary: 79 mg/dL (ref 70–99)
Glucose-Capillary: 73 mg/dL (ref 70–99)
Glucose-Capillary: 97 mg/dL (ref 70–99)

## 2024-01-01 LAB — COMPREHENSIVE METABOLIC PANEL WITH GFR
ALT: 13 U/L (ref 0–44)
AST: 16 U/L (ref 15–41)
Albumin: 3.3 g/dL — ABNORMAL LOW (ref 3.5–5.0)
Alkaline Phosphatase: 69 U/L (ref 38–126)
Anion gap: 9 (ref 5–15)
BUN: 11 mg/dL (ref 6–20)
CO2: 24 mmol/L (ref 22–32)
Calcium: 8.8 mg/dL — ABNORMAL LOW (ref 8.9–10.3)
Chloride: 101 mmol/L (ref 98–111)
Creatinine, Ser: 0.71 mg/dL (ref 0.61–1.24)
GFR, Estimated: >60 mL/min (ref >60)
Glucose, Bld: 121 mg/dL — ABNORMAL HIGH (ref 70–99)
Potassium: 3.7 mmol/L (ref 3.5–5.1)
Sodium: 134 mmol/L — ABNORMAL LOW (ref 135–145)
Total Bilirubin: 1.2 mg/dL (ref 0.0–1.2)
Total Protein: 7.7 g/dL (ref 6.5–8.1)

## 2024-01-01 NOTE — Progress Notes (Signed)
 PROGRESS NOTE    [name]  FMW:[id number] DOB: 09/28/1980 DOA: 12/30/2023 PCP: Celestia Rosaline SQUIBB, NP   Brief Narrative:  [name] is a 43 y.o. male with medical history significant of psoriasis, IV drug use, hepatitis C, GERD, heroin addiction, left abscesses who was brought in by his father complaining that the patient is hallucinating now that he is off of his methadone . Per patient (now that he is more awake and alert and oriented) confirms he only missed 1 to 2 days of methadone  and denies other drug use or medication changes.  Assessment & Plan:   Principal Problem:   Acute encephalopathy Active Problems:   Opiate dependence, continuous (HCC)   Psoriasis   Cellulitis of left arm   Hyponatremia   Toxic encephalopathy, multifactorial Hallucinations, resolved Rule out metabolic component Rule out substance abuse psychotic disorder/delusional disorder - Patient off methadone  only for 1 to 2 days per his own report - UDS positive for amphetamines, THC and benzos. None of these are active prescriptions per chart review - Continue home methadone  only(57 mg daily -carry 50 mg tablets here) - Continue Precedex , currently well-controlled -wean as tolerated -Continue home Seroquel , Vistaril  - History of substance abuse psychotic disorder noted in the chart, distant history mentions schizophrenia but unclear if this diagnosis was ever made or simply presumed - No indication for psychiatry evaluation at this time given improvement over the past 24 hours.  Should patient continue to have delusions/hallucinations despite appropriate medical management we will certainly reach out - No indication at this time for IVC, previous orders discontinued  Suspected sepsis, multifactorial Rule out staph bacteremia Left upper extremity cellulitis warm erythematous  Multiple skin lesions/breakdown in the setting of psoriasis, uncontrolled  - Continue vancomycin , discontinue  cephalosporin - BC ID positive for staph -unlikely given multiple positive blood cultures, TTE pending, repeat blood cultures in the next 24 hours  - May be prolonged hospitalization given possible need for IV antibiotics with his history he would likely not be a safe discharge with IV access  Hypovolemic hyponatremia  Profound dehydration from nausea vomiting and poor p.o. intake.  - IV fluids discontinued, tolerating p.o. well - Urine output improving, without overt AKI   Type 2 diabetes controlled Ketonuria -Continue sliding scale insulin , hypoglycemic protocol -A1c 5.5   Uncontrolled Psoriasis continue prior to admission Kenalog ; not on typical medications/biologic/dmards likely due to poor follow up (no history of . Continue supportive care.   History of hepatitis C.  Reported to have completed 12 weeks of Epclusa .  Although he has missed 1 or 2 doses. Hep C RNA undetectable as of 11/2023.   Hyperlipidemia on statin Hold nonessential medications at this time  Prolonged QT on methadone  440-follow repeat EKG, continue telemetry   DVT prophylaxis: enoxaparin  (LOVENOX ) injection 40 mg Start: 12/31/23 1000 Code Status:   Code Status: Full Code Family Communication: None present  Status is: Inpatient  Dispo: The patient is from: Home              Anticipated d/c is to: To be determined               Anticipated d/c date is:  to be determined               Patient currently not medically stable for discharge  Consultants:  None  Procedures:  None  Antimicrobials:  Vancomycin   Subjective: No acute issues or events this morning, patient more awake alert oriented and appropriate today  denies nausea vomiting diarrhea constipation headache fevers chills or chest pain.  Objective: Vitals:   01/01/24 0100 01/01/24 0200 01/01/24 0245 01/01/24 0400  BP: 133/86 115/81 126/77 (P) 124/81  Pulse:      Resp: 18 (!) 22 13 15   Temp:    97.7 F (36.5 C)  TempSrc:    Oral  SpO2:       Weight:      Height:        Intake/Output Summary (Last 24 hours) at 01/01/2024 0756 Last data filed at 01/01/2024 0729 Gross per 24 hour  Intake 1339.62 ml  Output 1250 ml  Net 89.62 ml   Filed Weights   12/30/23 1839  Weight: 111.1 kg    Examination:  General:  Pleasantly resting in bed, No acute distress but does appear to be somewhat anxious HEENT:  Normocephalic atraumatic.  Sclerae nonicteric, noninjected.  Extraocular movements intact bilaterally. Neck:  Without mass or deformity.  Trachea is midline. Lungs:  Clear to auscultate bilaterally without rhonchi, wheeze, or rales. Heart: Without obvious murmur Abdomen:  Soft, nontender, nondistended.  Without guarding or rebound. Extremities: Without cyanosis, clubbing, edema Skin: Diffuse psoriasis lesions most notably over extensor surfaces of legs and arms.  No overt signs of ecchymosis bleeding or injection site.  Data Reviewed: I have personally reviewed following labs and imaging studies  CBC: Recent Labs  Lab 12/30/23 0617 12/31/23 0948 01/01/24 0318  WBC 17.7* 16.0* 14.2*  NEUTROABS 13.4*  --   --   HGB 17.2* 15.5 14.8  HCT 52.6* 47.7 44.5  MCV 85.9 85.3 85.7  PLT 321 276 201   Basic Metabolic Panel: Recent Labs  Lab 12/30/23 0617 12/31/23 0948 01/01/24 0318  NA 134* 131* 134*  K 3.8 3.8 3.7  CL 96* 94* 101  CO2 25 24 24   GLUCOSE 141* 139* 121*  BUN 16 11 11   CREATININE 0.77 0.73 0.71  CALCIUM  9.7 9.3 8.8*   GFR: Estimated Creatinine Clearance: 150.9 mL/min (by C-G formula based on SCr of 0.71 mg/dL). Liver Function Tests: Recent Labs  Lab 12/30/23 0617 01/01/24 0318  AST 15 16  ALT 14 13  ALKPHOS 85 69  BILITOT 0.7 1.2  PROT 9.6* 7.7  ALBUMIN 4.1 3.3*   No results for input(s): LIPASE, AMYLASE in the last 168 hours. Recent Labs  Lab 12/30/23 1457  AMMONIA 41*   Coagulation Profile: Recent Labs  Lab 12/30/23 0812  INR 1.1   HbA1C: Recent Labs    12/30/23 1457   HGBA1C 5.5   CBG: Recent Labs  Lab 12/30/23 2123 12/31/23 0800 12/31/23 1151 12/31/23 1641 12/31/23 2135  GLUCAP 235* 137* 111* 110* 111*   Thyroid Function Tests: Recent Labs    12/30/23 1234  TSH 1.312   Sepsis Labs: Recent Labs  Lab 12/30/23 0814  LATICACIDVEN 1.1    Recent Results (from the past 240 hours)  Blood Culture (routine x 2)     Status: None (Preliminary result)   Collection Time: 12/30/23  8:13 AM   Specimen: BLOOD  Result Value Ref Range Status   Specimen Description   Final    BLOOD BLOOD LEFT FOREARM Performed at [facility], [address] [place], [place] [address]    Special Requests   Final    BOTTLES DRAWN AEROBIC ONLY Blood Culture results may not be optimal due to an inadequate volume of blood received in culture bottles Performed at [facility], [address] [place], [place]  [address]    Culture  Setup Time   Final    GRAM POSITIVE COCCI IN CLUSTERS AEROBIC BOTTLE ONLY CRITICAL VALUE NOTED.  VALUE IS CONSISTENT WITH PREVIOUSLY REPORTED AND CALLED VALUE. Performed at [facility], [address] [place], [place] [address]    Culture GRAM POSITIVE COCCI  Final   Report Status PENDING  Incomplete  Blood Culture (routine x 2)     Status: None (Preliminary result)   Collection Time: 12/30/23  8:14 AM   Specimen: BLOOD  Result Value Ref Range Status   Specimen Description   Final    BLOOD RIGHT ANTECUBITAL Performed at [facility], [address], [place], [place] [address]    Special Requests   Final    BOTTLES DRAWN AEROBIC AND ANAEROBIC Blood Culture adequate volume Performed at [facility], [address], [place], [place] [address]    Culture  Setup Time   Final    GRAM POSITIVE COCCI IN CLUSTERS ANAEROBIC BOTTLE ONLY CRITICAL RESULT CALLED TO, READ BACK BY AND VERIFIED WITH: PHARMD N GLOGOVAC 12/31/2023 @ 0613 BY AB Performed at [facility], [address] [place], [place] [address]    Culture GRAM POSITIVE COCCI  Final   Report Status PENDING  Incomplete  Blood Culture ID Panel (Reflexed)     Status: Abnormal   Collection Time: 12/30/23  8:14 AM  Result Value Ref Range Status   Enterococcus faecalis NOT DETECTED NOT DETECTED Final   Enterococcus Faecium NOT DETECTED NOT DETECTED Final   Listeria monocytogenes NOT DETECTED NOT DETECTED Final   Staphylococcus species DETECTED (A) NOT DETECTED Final    Comment: CRITICAL RESULT CALLED TO, READ BACK BY AND VERIFIED WITH: PHARMD N GLOGOVAC 12/31/2023 @ 0613 BY AB    Staphylococcus aureus (BCID) NOT DETECTED NOT DETECTED Final   Staphylococcus epidermidis NOT DETECTED NOT DETECTED Final   Staphylococcus lugdunensis NOT DETECTED NOT DETECTED Final   Streptococcus species NOT DETECTED NOT DETECTED Final   Streptococcus agalactiae NOT DETECTED NOT DETECTED Final   Streptococcus pneumoniae NOT DETECTED NOT DETECTED Final   Streptococcus pyogenes NOT DETECTED NOT DETECTED Final   A.calcoaceticus-baumannii NOT DETECTED NOT DETECTED Final   Bacteroides fragilis NOT DETECTED NOT DETECTED Final   Enterobacterales NOT DETECTED NOT DETECTED Final   Enterobacter cloacae complex NOT DETECTED NOT DETECTED Final   Escherichia coli NOT DETECTED NOT DETECTED Final   Klebsiella aerogenes NOT DETECTED NOT DETECTED Final   Klebsiella oxytoca NOT DETECTED NOT DETECTED Final   Klebsiella pneumoniae NOT DETECTED NOT DETECTED Final   Proteus species NOT DETECTED NOT DETECTED Final   Salmonella species NOT DETECTED NOT DETECTED Final   Serratia marcescens NOT DETECTED NOT DETECTED Final   Haemophilus influenzae NOT DETECTED NOT DETECTED Final   Neisseria meningitidis NOT DETECTED NOT DETECTED Final   Pseudomonas aeruginosa NOT DETECTED NOT DETECTED Final   Stenotrophomonas maltophilia NOT DETECTED NOT DETECTED Final   Candida albicans NOT DETECTED NOT DETECTED Final   Candida auris  NOT DETECTED NOT DETECTED Final   Candida glabrata NOT DETECTED NOT DETECTED Final   Candida krusei NOT DETECTED NOT DETECTED Final   Candida parapsilosis NOT DETECTED NOT DETECTED Final   Candida tropicalis NOT DETECTED NOT DETECTED Final   Cryptococcus neoformans/gattii NOT DETECTED NOT DETECTED Final    Comment: Performed at [facility], [address] [place], [place] [address]  Resp panel by RT-PCR (RSV, Flu A&B, Covid) Anterior Nasal Swab  Status: None   Collection Time: 12/30/23 12:20 PM   Specimen: Anterior Nasal Swab  Result Value Ref Range Status   SARS Coronavirus 2 by RT PCR NEGATIVE NEGATIVE Final    Comment: (NOTE) SARS-CoV-2 target nucleic acids are NOT DETECTED.  The SARS-CoV-2 RNA is generally detectable in upper respiratory specimens during the acute phase of infection. The lowest concentration of SARS-CoV-2 viral copies this assay can detect is 138 copies/mL. A negative result does not preclude SARS-Cov-2 infection and should not be used as the sole basis for treatment or other patient management decisions. A negative result may occur with  improper specimen collection/handling, submission of specimen other than nasopharyngeal swab, presence of viral mutation(s) within the areas targeted by this assay, and inadequate number of viral copies(<138 copies/mL). A negative result must be combined with clinical observations, patient history, and epidemiological information. The expected result is Negative.  Fact Sheet for Patients:  BloggerCourse.com  Fact Sheet for Healthcare Providers:  SeriousBroker.it  This test is no t yet approved or cleared by the [place]  FDA and  has been authorized for detection and/or diagnosis of SARS-CoV-2 by FDA under an Emergency Use Authorization (EUA). This EUA will remain  in effect (meaning this test can be used) for the duration of the COVID-19 declaration under  Section 564(b)(1) of the Act, 21 U.S.C.section 360bbb-3(b)(1), unless the authorization is terminated  or revoked sooner.       Influenza A by PCR NEGATIVE NEGATIVE Final   Influenza B by PCR NEGATIVE NEGATIVE Final    Comment: (NOTE) The Xpert Xpress SARS-CoV-2/FLU/RSV plus assay is intended as an aid in the diagnosis of influenza from Nasopharyngeal swab specimens and should not be used as a sole basis for treatment. Nasal washings and aspirates are unacceptable for Xpert Xpress SARS-CoV-2/FLU/RSV testing.  Fact Sheet for Patients: BloggerCourse.com  Fact Sheet for Healthcare Providers: SeriousBroker.it  This test is not yet approved or cleared by the [place]  FDA and has been authorized for detection and/or diagnosis of SARS-CoV-2 by FDA under an Emergency Use Authorization (EUA). This EUA will remain in effect (meaning this test can be used) for the duration of the COVID-19 declaration under Section 564(b)(1) of the Act, 21 U.S.C. section 360bbb-3(b)(1), unless the authorization is terminated or revoked.     Resp Syncytial Virus by PCR NEGATIVE NEGATIVE Final    Comment: (NOTE) Fact Sheet for Patients: BloggerCourse.com  Fact Sheet for Healthcare Providers: SeriousBroker.it  This test is not yet approved or cleared by the [place]  FDA and has been authorized for detection and/or diagnosis of SARS-CoV-2 by FDA under an Emergency Use Authorization (EUA). This EUA will remain in effect (meaning this test can be used) for the duration of the COVID-19 declaration under Section 564(b)(1) of the Act, 21 U.S.C. section 360bbb-3(b)(1), unless the authorization is terminated or revoked.  Performed at [facility], [address], [place], [place] [address]   MRSA Next Gen by PCR, Nasal     Status: None   Collection Time: 12/30/23 10:44 PM    Specimen: Nasal Mucosa; Nasal Swab  Result Value Ref Range Status   MRSA by PCR Next Gen NOT DETECTED NOT DETECTED Final    Comment: (NOTE) The GeneXpert MRSA Assay (FDA approved for NASAL specimens only), is one component of a comprehensive MRSA colonization surveillance program. It is not intended to diagnose MRSA infection nor to guide or monitor treatment for MRSA infections. Test performance is not FDA approved in patients less  than 61 years old. Performed at [facility], [address] [place], [place] [address]          Radiology Studies: CT ABDOMEN PELVIS W CONTRAST Result Date: 12/30/2023 CLINICAL DATA:  Sepsis. EXAM: CT ABDOMEN AND PELVIS WITH CONTRAST TECHNIQUE: Multidetector CT imaging of the abdomen and pelvis was performed using the standard protocol following bolus administration of intravenous contrast. RADIATION DOSE REDUCTION: This exam was performed according to the departmental dose-optimization program which includes automated exposure control, adjustment of the mA and/or kV according to patient size and/or use of iterative reconstruction technique. CONTRAST:  100mL OMNIPAQUE  IOHEXOL  300 MG/ML  SOLN COMPARISON:  None Available. FINDINGS: Evaluation of this exam is limited due to respiratory motion. Lower chest: The visualized lung bases are clear. No intra-abdominal free air or free fluid. Hepatobiliary: Subcentimeter hypodense lesion in the right lobe of the liver is too small to characterize. No biliary dilatation. The gallbladder is unremarkable. Pancreas: Unremarkable. No pancreatic ductal dilatation or surrounding inflammatory changes. Spleen: Normal in size without focal abnormality. Adrenals/Urinary Tract: The adrenal glands unremarkable. There is a 2 cm left renal inferior pole exophytic cyst. There is no hydronephrosis on either side. The visualized ureters and urinary bladder appear unremarkable. Stomach/Bowel: There is no bowel obstruction or  active inflammation. The appendix is normal. Vascular/Lymphatic: Mild aortoiliac atherosclerotic disease. The IVC is unremarkable. No portal venous gas. There is no adenopathy. Reproductive: The prostate and seminal vesicles are grossly remarkable. No pelvic mass. Other: None Musculoskeletal: No acute osseous pathology. IMPRESSION: 1. No acute intra-abdominal or pelvic pathology. 2.  Aortic Atherosclerosis (ICD10-I70.0). Electronically Signed   By: Vanetta Chou M.D.   On: 12/30/2023 10:42   CT Head Wo Contrast Result Date: 12/30/2023 EXAM: CT HEAD WITHOUT CONTRAST 12/30/2023 09:57:07 AM TECHNIQUE: CT of the head was performed without the administration of intravenous contrast. Automated exposure control, iterative reconstruction, and/or weight based adjustment of the mA/kV was utilized to reduce the radiation dose to as low as reasonably achievable. COMPARISON: CT head without contrast 05/29/2006. CLINICAL HISTORY: Mental status change, unknown cause. Patient brought to ED by father for evaluation of hallucinating and coming off methadone . Father reports last dose of methadone  was yesterday. FINDINGS: BRAIN AND VENTRICLES: No acute hemorrhage. Gray-Tax differentiation is preserved. No hydrocephalus. No extra-axial collection. No mass effect or midline shift. ORBITS: No acute abnormality. SINUSES: No acute abnormality. SOFT TISSUES AND SKULL: No acute soft tissue abnormality. No skull fracture. IMPRESSION: 1. No acute intracranial abnormality. Electronically signed by: Lonni Necessary MD 12/30/2023 10:18 AM EDT RP Workstation: [id number]   DG Chest Port 1 View Result Date: 12/30/2023 CLINICAL DATA:  Questionable sepsis. EXAM: PORTABLE CHEST 1 VIEW COMPARISON:  Chest radiograph dated 03/24/2017. FINDINGS: The heart size and mediastinal contours are within normal limits. Both lungs are clear. The visualized skeletal structures are unremarkable. IMPRESSION: No active disease. Electronically Signed    By: Vanetta Chou M.D.   On: 12/30/2023 09:21        Scheduled Meds:  atorvastatin   20 mg Oral QHS   Chlorhexidine  Gluconate Cloth  6 each Topical Daily   enoxaparin  (LOVENOX ) injection  40 mg Subcutaneous Q24H   glipiZIDE   10 mg Oral BID AC   insulin  aspart  0-9 Units Subcutaneous TID WC   lisinopril   5 mg Oral Daily   metFORMIN   500 mg Oral Q breakfast   methadone   50 mg Oral Daily   nicotine   14 mg Transdermal Daily   pantoprazole   20  mg Oral Daily   QUEtiapine   300 mg Oral QHS   triamcinolone  cream   Topical TID   Continuous Infusions:  sodium chloride  Stopped (12/30/23 1817)   dexmedetomidine  (PRECEDEX ) IV infusion 0.6 mcg/kg/hr (01/01/24 0729)   vancomycin  Stopped (12/31/23 2313)     LOS: 2 days   Time spent:  Elsie JAYSON Montclair, DO [facility]  If 7PM-7AM, please contact night-coverage [email or web address]  01/01/2024, 7:56 AM

## 2024-01-01 NOTE — Plan of Care (Signed)

## 2024-01-01 NOTE — Plan of Care (Signed)
  Problem: Clinical Measurements: Goal: Ability to maintain clinical measurements within normal limits will improve Outcome: Progressing Goal: Will remain free from infection Outcome: Progressing Goal: Diagnostic test results will improve Outcome: Progressing Goal: Cardiovascular complication will be avoided Outcome: Progressing   Problem: Activity: Goal: Risk for activity intolerance will decrease Outcome: Progressing   Problem: Nutrition: Goal: Adequate nutrition will be maintained Outcome: Progressing   Problem: Coping: Goal: Level of anxiety will decrease Outcome: Progressing   Problem: Elimination: Goal: Will not experience complications related to bowel motility Outcome: Progressing Goal: Will not experience complications related to urinary retention Outcome: Progressing   Problem: Pain Managment: Goal: General experience of comfort will improve and/or be controlled Outcome: Progressing   Problem: Safety: Goal: Ability to remain free from injury will improve Outcome: Progressing

## 2024-01-02 ENCOUNTER — Inpatient Hospital Stay (HOSPITAL_COMMUNITY): Payer: MEDICAID

## 2024-01-02 DIAGNOSIS — L409 Psoriasis, unspecified: Secondary | ICD-10-CM

## 2024-01-02 DIAGNOSIS — E871 Hypo-osmolality and hyponatremia: Secondary | ICD-10-CM

## 2024-01-02 DIAGNOSIS — I330 Acute and subacute infective endocarditis: Secondary | ICD-10-CM | POA: Diagnosis not present

## 2024-01-02 DIAGNOSIS — L03114 Cellulitis of left upper limb: Secondary | ICD-10-CM | POA: Diagnosis not present

## 2024-01-02 DIAGNOSIS — G9340 Encephalopathy, unspecified: Secondary | ICD-10-CM | POA: Diagnosis not present

## 2024-01-02 LAB — CULTURE, BLOOD (ROUTINE X 2): Special Requests: ADEQUATE

## 2024-01-02 LAB — GLUCOSE, CAPILLARY
Glucose-Capillary: 155 mg/dL — ABNORMAL HIGH (ref 70–99)
Glucose-Capillary: 114 mg/dL — ABNORMAL HIGH (ref 70–99)
Glucose-Capillary: 99 mg/dL (ref 70–99)
Glucose-Capillary: 86 mg/dL (ref 70–99)

## 2024-01-02 LAB — COMPREHENSIVE METABOLIC PANEL WITH GFR
ALT: 14 U/L (ref 0–44)
AST: 15 U/L (ref 15–41)
Albumin: 3.5 g/dL (ref 3.5–5.0)
Alkaline Phosphatase: 71 U/L (ref 38–126)
Anion gap: 10 (ref 5–15)
BUN: 14 mg/dL (ref 6–20)
CO2: 22 mmol/L (ref 22–32)
Calcium: 9.1 mg/dL (ref 8.9–10.3)
Chloride: 102 mmol/L (ref 98–111)
Creatinine, Ser: 0.7 mg/dL (ref 0.61–1.24)
GFR, Estimated: >60 mL/min (ref >60)
Glucose, Bld: 81 mg/dL (ref 70–99)
Potassium: 3.3 mmol/L — ABNORMAL LOW (ref 3.5–5.1)
Sodium: 134 mmol/L — ABNORMAL LOW (ref 135–145)
Total Bilirubin: 0.8 mg/dL (ref 0.0–1.2)
Total Protein: 8 g/dL (ref 6.5–8.1)

## 2024-01-02 LAB — CBC
HCT: 48.4 % (ref 39.0–52.0)
Hemoglobin: 15.8 g/dL (ref 13.0–17.0)
MCH: 28.4 pg (ref 26.0–34.0)
MCHC: 32.6 g/dL (ref 30.0–36.0)
MCV: 87.1 fL (ref 80.0–100.0)
Platelets: 189 K/uL (ref 150–400)
RBC: 5.56 MIL/uL (ref 4.22–5.81)
RDW: 13.4 % (ref 11.5–15.5)
WBC: 11.9 K/uL — ABNORMAL HIGH (ref 4.0–10.5)
nRBC: 0 % (ref 0.0–0.2)

## 2024-01-02 LAB — ECHOCARDIOGRAM COMPLETE
Area-P 1/2: 3.42 cm2
Height: 71 in
S' Lateral: 3.1 cm
Weight: 3918.9 [oz_av]

## 2024-01-02 MED ORDER — TRIAMCINOLONE 0.1 % CREAM:EUCERIN CREAM 1:1
TOPICAL_CREAM | Freq: Three times a day (TID) | CUTANEOUS | Status: DC
  Filled 2024-01-02 (×2): qty 60

## 2024-01-02 NOTE — Progress Notes (Signed)
  Echocardiogram 2D Echocardiogram has been performed.  Koleen KANDICE Popper, RDCS 01/02/2024, 1:27 PM

## 2024-01-02 NOTE — Hospital Course (Addendum)
 [name] is a 43 y.o. male with a history of psoriasis, IV drug use, hepatitis C, GERD, heroin addiction.  Patient presented secondary to hallucinations.  Patient managed on Precedex  dementia improvement mental status.  Hospitalization complicated by evidence of Staphylococcus hominis bacteremia; vancomycin  started.  ID consulted.

## 2024-01-02 NOTE — Plan of Care (Signed)

## 2024-01-02 NOTE — Progress Notes (Addendum)
 PROGRESS NOTE    [name]  FMW:[id number] DOB: Jan 09, 1981 DOA: 12/30/2023 PCP: Celestia Rosaline SQUIBB, NP   Brief Narrative: [name] is a 43 y.o. male with a history of psoriasis, IV drug use, hepatitis C, GERD, heroin addiction.  Patient presented secondary to hallucinations.  Patient managed on Precedex  dementia improvement mental status.  Hospitalization complicated by evidence of Staphylococcus hominis bacteremia; vancomycin  started.  ID consulted.   Assessment and Plan:  Toxic encephalopathy Present on admission.  Concern for possible drug effect versus metabolic effect.  CT head without acute process.  Patient is managed supportively with Precedex  with eventual improvement in mental status. Resolved.  Sepsis Non present on admission.  Secondary to Staphylococcus bacteremia.  Staphylococcus hominis bacteremia Patient started on Vancomycin . Noted on blood cultures (7/). Both samples with bacteria of similar sensitivity profile. Patient with uncontrolled psoriasis with concern for cellulitis on admission, which may be source. Transthoracic Echocardiogram ordered and read is pending. -Continue Vancomycin  -ID consultation -Repeat blood cultures (7/30)  Right arm cellulitis Noted on admission. Patient initially treated with Vancomycin , Cefepime  and Flagyl , and transitioned to Vancomycin  monotherapy  Hypovolemic hyponatremia Mild hyponatremia. Stable.  Diabetes mellitus type 2 Well controlled based on hemoglobin A1C of 5.5%. -Continue SSI  Psoriasis Noted. Patient follows with dermatology as an outpatient, but has poor follow-up. Likely contributory to bacteremia. -Continue Kenalog   History of hepatitis C Per history, patient completed 12 weeks of Epclusa  (missed 1-2 doses). Last Hep C RNA undetectable  Hyperlipidemia -Continue Lipitor  Prolonged QTc Noted; borderline. Patient is on methadone .    DVT prophylaxis: Lovenox  Code Status:   Code Status: Full  Code Family Communication: None at bedside Disposition Plan: Discharge pending ID recommendations   Consultants:  Infectious disease  Procedures:  Transthoracic Echocardiogram  Antimicrobials: Vancomycin     Subjective: Patient reports no issues this morning. Feels well.  Objective: BP 120/74 (BP Location: Right Arm)   Pulse 65   Temp 98.3 F (36.8 C)   Resp 18   Ht 5' 11 (1.803 m)   Wt 111.1 kg   SpO2 99%   BMI 34.16 kg/m   Examination:  General exam: Appears calm and comfortable Respiratory system: Clear to auscultation. Respiratory effort normal. Cardiovascular system: S1 & S2 heard, RRR. No murmurs, rubs, gallops or clicks. Gastrointestinal system: Abdomen is nondistended, soft and nontender. Normal bowel sounds heard. Central nervous system: Alert and oriented. No focal neurological deficits. Skin: Mildly erythematous rashes involving extremities. Psychiatry: Judgement and insight appear normal. Mood & affect appropriate.    Data Reviewed: I have personally reviewed following labs and imaging studies  CBC Lab Results  Component Value Date   WBC 11.9 (H) 01/02/2024   RBC 5.56 01/02/2024   HGB 15.8 01/02/2024   HCT 48.4 01/02/2024   MCV 87.1 01/02/2024   MCH 28.4 01/02/2024   PLT 189 01/02/2024   MCHC 32.6 01/02/2024   RDW 13.4 01/02/2024   LYMPHSABS 3.3 12/30/2023   MONOABS 0.8 12/30/2023   EOSABS 0.0 12/30/2023   BASOSABS 0.1 12/30/2023     Last metabolic panel Lab Results  Component Value Date   NA 134 (L) 01/02/2024   K 3.3 (L) 01/02/2024   CL 102 01/02/2024   CO2 22 01/02/2024   BUN 14 01/02/2024   CREATININE 0.70 01/02/2024   GLUCOSE 81 01/02/2024   GFRNONAA >60 01/02/2024   GFRAA >60 03/27/2017   CALCIUM  9.1 01/02/2024   PROT 8.0 01/02/2024   ALBUMIN 3.5 01/02/2024  BILITOT 0.8 01/02/2024   ALKPHOS 71 01/02/2024   AST 15 01/02/2024   ALT 14 01/02/2024   ANIONGAP 10 01/02/2024    GFR: Estimated Creatinine Clearance: 150.9  mL/min (by C-G formula based on SCr of 0.7 mg/dL).   Radiology Studies: No results found.    LOS: 3 days    Elgin Lam, MD [facility] 01/02/2024, 4:14 PM   If 7PM-7AM, please contact night-coverage [email or web address]

## 2024-01-02 NOTE — Plan of Care (Signed)
  Problem: Clinical Measurements: Goal: Ability to maintain clinical measurements within normal limits will improve Outcome: Progressing Goal: Diagnostic test results will improve Outcome: Progressing   Problem: Activity: Goal: Risk for activity intolerance will decrease Outcome: Progressing   Problem: Nutrition: Goal: Adequate nutrition will be maintained Outcome: Progressing   Problem: Coping: Goal: Level of anxiety will decrease Outcome: Progressing   Problem: Health Behavior/Discharge Planning: Goal: Ability to identify and utilize available resources and services will improve Outcome: Progressing   Problem: Metabolic: Goal: Ability to maintain appropriate glucose levels will improve Outcome: Progressing

## 2024-01-03 DIAGNOSIS — G9340 Encephalopathy, unspecified: Secondary | ICD-10-CM | POA: Diagnosis not present

## 2024-01-03 DIAGNOSIS — F1120 Opioid dependence, uncomplicated: Secondary | ICD-10-CM

## 2024-01-03 DIAGNOSIS — B182 Chronic viral hepatitis C: Secondary | ICD-10-CM

## 2024-01-03 DIAGNOSIS — L03114 Cellulitis of left upper limb: Secondary | ICD-10-CM | POA: Diagnosis not present

## 2024-01-03 DIAGNOSIS — L409 Psoriasis, unspecified: Secondary | ICD-10-CM | POA: Diagnosis not present

## 2024-01-03 DIAGNOSIS — E871 Hypo-osmolality and hyponatremia: Secondary | ICD-10-CM | POA: Diagnosis not present

## 2024-01-03 DIAGNOSIS — F1990 Other psychoactive substance use, unspecified, uncomplicated: Secondary | ICD-10-CM

## 2024-01-03 DIAGNOSIS — B957 Other staphylococcus as the cause of diseases classified elsewhere: Secondary | ICD-10-CM

## 2024-01-03 LAB — GLUCOSE, CAPILLARY
Glucose-Capillary: 105 mg/dL — ABNORMAL HIGH (ref 70–99)
Glucose-Capillary: 73 mg/dL (ref 70–99)
Glucose-Capillary: 111 mg/dL — ABNORMAL HIGH (ref 70–99)
Glucose-Capillary: 60 mg/dL — ABNORMAL LOW (ref 70–99)
Glucose-Capillary: 111 mg/dL — ABNORMAL HIGH (ref 70–99)

## 2024-01-03 LAB — CBC
HCT: 48.1 % (ref 39.0–52.0)
Hemoglobin: 15.7 g/dL (ref 13.0–17.0)
MCH: 28.1 pg (ref 26.0–34.0)
MCHC: 32.6 g/dL (ref 30.0–36.0)
MCV: 86 fL (ref 80.0–100.0)
Platelets: 221 K/uL (ref 150–400)
RBC: 5.59 MIL/uL (ref 4.22–5.81)
RDW: 13.5 % (ref 11.5–15.5)
WBC: 12.7 K/uL — ABNORMAL HIGH (ref 4.0–10.5)
nRBC: 0 % (ref 0.0–0.2)

## 2024-01-03 LAB — COMPREHENSIVE METABOLIC PANEL WITH GFR
ALT: 15 U/L (ref 0–44)
AST: 15 U/L (ref 15–41)
Albumin: 3.3 g/dL — ABNORMAL LOW (ref 3.5–5.0)
Alkaline Phosphatase: 71 U/L (ref 38–126)
Anion gap: 12 (ref 5–15)
BUN: 14 mg/dL (ref 6–20)
CO2: 24 mmol/L (ref 22–32)
Calcium: 9.2 mg/dL (ref 8.9–10.3)
Chloride: 101 mmol/L (ref 98–111)
Creatinine, Ser: 0.72 mg/dL (ref 0.61–1.24)
GFR, Estimated: >60 mL/min (ref >60)
Glucose, Bld: 90 mg/dL (ref 70–99)
Potassium: 3.5 mmol/L (ref 3.5–5.1)
Sodium: 137 mmol/L (ref 135–145)
Total Bilirubin: 0.5 mg/dL (ref 0.0–1.2)
Total Protein: 8.5 g/dL — ABNORMAL HIGH (ref 6.5–8.1)

## 2024-01-03 MED ORDER — NICOTINE 21 MG/24HR TD PT24
21.0000 mg | MEDICATED_PATCH | Freq: Every day | TRANSDERMAL | Status: DC
  Administered 2024-01-03 – 2024-01-04 (×2): 21 mg via TRANSDERMAL
  Filled 2024-01-03 (×2): qty 1

## 2024-01-03 MED ORDER — CEFAZOLIN SODIUM-DEXTROSE 2-4 GM/100ML-% IV SOLN
2.0000 g | Freq: Three times a day (TID) | INTRAVENOUS | Status: DC
  Administered 2024-01-03 – 2024-01-04 (×4): 2 g via INTRAVENOUS
  Filled 2024-01-03 (×4): qty 100

## 2024-01-03 MED ORDER — SODIUM CHLORIDE 0.9 % IV SOLN: INTRAVENOUS | Status: DC

## 2024-01-03 NOTE — Progress Notes (Signed)
 Patient CBG 60, asymptomatic,  treated with Oj and snacks, rechecked 111. Provider notified.

## 2024-01-03 NOTE — Plan of Care (Signed)
  Problem: Education: Goal: Knowledge of General Education information will improve Description: Including pain rating scale, medication(s)/side effects and non-pharmacologic comfort measures Outcome: Progressing   Problem: Health Behavior/Discharge Planning: Goal: Ability to manage health-related needs will improve Outcome: Progressing   Problem: Clinical Measurements: Goal: Ability to maintain clinical measurements within normal limits will improve Outcome: Progressing Goal: Will remain free from infection Outcome: Progressing Goal: Diagnostic test results will improve Outcome: Progressing Goal: Respiratory complications will improve Outcome: Progressing Goal: Cardiovascular complication will be avoided Outcome: Progressing   Problem: Activity: Goal: Risk for activity intolerance will decrease Outcome: Progressing   Problem: Nutrition: Goal: Adequate nutrition will be maintained Outcome: Progressing   Problem: Coping: Goal: Level of anxiety will decrease Outcome: Progressing   Problem: Safety: Goal: Ability to remain free from injury will improve Outcome: Progressing   Problem: Skin Integrity: Goal: Risk for impaired skin integrity will decrease Outcome: Progressing   Problem: Coping: Goal: Ability to adjust to condition or change in health will improve Outcome: Progressing   Problem: Fluid Volume: Goal: Ability to maintain a balanced intake and output will improve Outcome: Progressing   Problem: Education: Goal: Ability to describe self-care measures that may prevent or decrease complications (Diabetes Survival Skills Education) will improve Outcome: Progressing Goal: Individualized Educational Video(s) Outcome: Progressing   Problem: Metabolic: Goal: Ability to maintain appropriate glucose levels will improve Outcome: Progressing   Problem: Nutritional: Goal: Maintenance of adequate nutrition will improve Outcome: Progressing Goal: Progress toward  achieving an optimal weight will improve Outcome: Progressing   Problem: Safety: Goal: Non-violent Restraint(s) Outcome: Progressing   Problem: Tissue Perfusion: Goal: Adequacy of tissue perfusion will improve Outcome: Progressing

## 2024-01-03 NOTE — Progress Notes (Signed)
 Pharmacy: Antimicrobial Stewardship Note  72 YOM with staph hominis bacteremia - drawn from separate sites with same sensitivities listed below:  Culture STAPHYLOCOCCUS HOMINIS Abnormal     Report Status 01/02/2024 FINAL   Organism ID, Bacteria STAPHYLOCOCCUS HOMINIS  Susceptibility  Staphylococcus hominis ([id number])  Antibiotic Interpretation Microscan Method Status   CIPROFLOXACIN Sensitive <=0.5 SENSITIVE MIC Final   ERYTHROMYCIN Resistant >=8 RESISTANT MIC Final   GENTAMICIN Sensitive <=0.5 SENSITIVE MIC Final   OXACILLIN Sensitive <=0.25 SENSITIVE MIC Final   TETRACYCLINE Sensitive <=1 SENSITIVE MIC Final   VANCOMYCIN  Sensitive <=0.5 SENSITIVE MIC Final   TRIMETH /SULFA  Sensitive <=10 SENSITIVE MIC Final   CLINDAMYCIN  Resistant >=8 RESISTANT MIC Final   RIFAMPIN Sensitive <=0.5 SENSITIVE MIC Final   Inducible Clindamycin  Sensitive NEGATIVE MIC Final        ID team consulted for work-up and evaluation. Will narrow Vancomycin  to Cefazolin  given oxa-sensitive, will also provide continued coverage for cellulitis. Discussed with Dr. Fleeta Rothman.  Plan - D/c Vancomycin  - Start Cefazolin  2g IV every 8 hours - Will follow along with continued work up and plans for treatment and LOT  Thank you for allowing pharmacy to be a part of this patient's care.  Almarie Lunger, PharmD, BCPS, BCIDP Infectious Diseases Clinical Pharmacist 01/03/2024 10:29 AM   **Pharmacist phone directory can now be found on [email or web address] ([email or web address]).  Listed under [facility].

## 2024-01-03 NOTE — H&P (View-Only) (Signed)
 Date of Admission:  12/30/2023          Reason for Consult: Staphylococcus hominis bacteremia rule out endocarditis    Referring Provider: Elgin Lam, MD   Assessment:  Staphylococcus hominis bacteremia rule out endocarditis History of IV drug use largely with fentanyl  having been on methadone  through methadone  clinic Recent use of snorting methamphetamine though he denies shooting it Chronic hepatitis C without hepatic coma status post cure with Epclusa  Plaque psoriasis  Plan:  Narrowed to cefazolin  Obtain TEE Follow-up repeat blood cultures on the 30th or so far without growth Check HCV RNA, hep B status, HIV Standard universal precautions  Principal Problem:   Bacteremia Active Problems:   Opiate dependence, continuous (HCC)   Psoriasis   Cellulitis of left arm   Hyponatremia   Acute encephalopathy   Scheduled Meds:  atorvastatin   20 mg Oral QHS   Chlorhexidine  Gluconate Cloth  6 each Topical Daily   enoxaparin  (LOVENOX ) injection  40 mg Subcutaneous Q24H   glipiZIDE   10 mg Oral BID AC   insulin  aspart  0-9 Units Subcutaneous TID WC   lisinopril   5 mg Oral Daily   metFORMIN   500 mg Oral Q breakfast   methadone   50 mg Oral Daily   nicotine   21 mg Transdermal Daily   pantoprazole   20 mg Oral Daily   QUEtiapine   300 mg Oral QHS   triamcinolone  0.1 % cream : eucerin   Topical TID   Continuous Infusions:   ceFAZolin  (ANCEF ) IV 2 g (01/03/24 1001)   PRN Meds:.acetaminophen , hydrOXYzine , sodium chloride  flush  HPI: [name] is a 43 y.o. male history of IV drug abuse largely fentanyl  that has periodically been in remission but which she has used more recently prior hepatitis C that is status posttreatment with Epclusa , who has been going to methadone  clinic but then recently snorted some methamphetamine and was without his methadone  and was found to be encephalopathic.  He had nausea vomiting abdominal pain as well blood cultures were taken which  grew Staphylococcus hominis in 2 out of 2 cultures taken and with identical sensitivity pattern in both.  He has had a transthoracic echocardiogram does not show evidence of endocarditis.  He was narrowed to vancomycin  and now we are narrowing him to cefazolin  now that we know that his Staphylococcus hominis is is oxacillin sensitive.   He has plaque psoriasis which has been worse recently but I do not find any evidence of cellulitis or a focal abscess on exam  We will obtain a transesophageal echocardiogram and I have called cardiology and it will be done tomorrow.  If he has infectious endocarditis he should stay in the hospital for several weeks to get parenteral antibiotics prior to consideration of oral therapy to complete treatment.  He does not have endocarditis we could give him a shorter period of antibiotics and could switch him over to oral antibiotics more quickly.  Make sure that he does not have hepatitis C reinfection and that he is immune to hepatitis B and a and that he has been screened for HIV again  I have personally spent 80 minutes involved in face-to-face and non-face-to-face activities for this patient on the day of the visit. Professional time spent includes the following activities: Preparing to see the patient (review of tests), Obtaining and/or reviewing separately obtained history (admission/discharge record), Performing a medically appropriate examination and/or evaluation , Ordering medications/tests/procedures, referring and communicating with other health care  professionals, Documenting clinical information in the EMR, Independently interpreting results (not separately reported), Communicating results to the patient/family/caregiver, Counseling and educating the patient/family/caregiver and Care coordination (not separately reported).   Evaluation of the patient requires complex antimicrobial therapy evaluation, counseling , isolation needs to reduce disease  transmission and risk assessment and mitigation.     Review of Systems: Review of Systems  Constitutional:  Negative for chills, fever, malaise/fatigue and weight loss.  HENT:  Negative for congestion and sore throat.   Eyes:  Negative for blurred vision and photophobia.  Respiratory:  Negative for cough, shortness of breath and wheezing.   Cardiovascular:  Negative for chest pain, palpitations and leg swelling.  Gastrointestinal:  Negative for abdominal pain, blood in stool, constipation, diarrhea, heartburn, melena, nausea and vomiting.  Genitourinary:  Negative for dysuria, flank pain and hematuria.  Musculoskeletal:  Negative for back pain, falls, joint pain and myalgias.  Skin:  Negative for itching and rash.  Neurological:  Negative for dizziness, focal weakness, loss of consciousness, weakness and headaches.  Endo/Heme/Allergies:  Does not bruise/bleed easily.  Psychiatric/Behavioral:  Positive for hallucinations and substance abuse. Negative for depression and suicidal ideas. The patient does not have insomnia.     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 rate 18, height 5' 11 (1.803 m), weight 111.1 kg, SpO2 100%.  Physical Exam Constitutional:      Appearance: He is well-developed.  HENT:     Head: Normocephalic and atraumatic.  Eyes:     Conjunctiva/sclera: Conjunctivae normal.  Cardiovascular:     Rate and Rhythm: Normal rate and regular rhythm.     Heart sounds: No murmur heard.    No friction rub. No gallop.  Pulmonary:     Effort: Pulmonary effort is normal. No respiratory distress.     Breath sounds: No stridor. No wheezing or rhonchi.  Abdominal:     General: There is no distension.     Palpations: Abdomen is soft. There is no mass.  Musculoskeletal:        General: No tenderness. Normal range of motion.     Cervical back: Normal range of motion and neck supple.  Skin:    General: Skin is warm and dry.     Coloration: Skin is not pale.     Findings: No erythema or rash.  Neurological:     General: No focal deficit present.     Mental Status: He is alert and oriented  to person, place, and time.  Psychiatric:        Mood and Affect: Mood normal.        Behavior: Behavior normal.        Thought Content: Thought content normal.        Judgment: Judgment normal.    Plaque psoriasis         Lab Results Lab Results  Component Value Date   WBC 12.7 (H) 01/03/2024   HGB 15.7 01/03/2024   HCT 48.1 01/03/2024   MCV 86.0 01/03/2024   PLT 221 01/03/2024    Lab Results  Component Value Date   CREATININE 0.72 01/03/2024   BUN 14 01/03/2024   NA 137 01/03/2024   K 3.5 01/03/2024   CL 101 01/03/2024   CO2 24 01/03/2024    Lab Results  Component Value Date   ALT 15 01/03/2024   AST 15 01/03/2024   ALKPHOS 71 01/03/2024   BILITOT 0.5 01/03/2024     Microbiology: Recent Results (from the past 240 hours)  Blood Culture (routine x 2)     Status: Abnormal   Collection Time: 12/30/23  8:13 AM   Specimen: BLOOD  Result Value Ref Range Status   Specimen Description   Final    BLOOD BLOOD LEFT  FOREARM Performed at [facility], [address] [place], [place] [address]    Special Requests   Final    BOTTLES DRAWN AEROBIC ONLY Blood Culture results may not be optimal due to an inadequate volume of blood received in culture bottles Performed at [facility], [address], [place], [place] [address]    Culture  Setup Time   Final    GRAM POSITIVE COCCI IN CLUSTERS AEROBIC BOTTLE ONLY CRITICAL VALUE NOTED.  VALUE IS CONSISTENT WITH PREVIOUSLY REPORTED AND CALLED VALUE. Performed at [facility], [address], [place], [place] [address]    Culture STAPHYLOCOCCUS HOMINIS (A)  Final   Report Status 01/02/2024 FINAL  Final   Organism ID, Bacteria STAPHYLOCOCCUS HOMINIS  Final      Susceptibility   Staphylococcus hominis - MIC*    CIPROFLOXACIN <=0.5 SENSITIVE Sensitive     ERYTHROMYCIN >=8 RESISTANT Resistant     GENTAMICIN <=0.5 SENSITIVE Sensitive     OXACILLIN <=0.25 SENSITIVE Sensitive     TETRACYCLINE <=1 SENSITIVE Sensitive     VANCOMYCIN  <=0.5 SENSITIVE Sensitive     TRIMETH /SULFA  <=10 SENSITIVE Sensitive     CLINDAMYCIN  >=8 RESISTANT Resistant     RIFAMPIN <=0.5 SENSITIVE Sensitive     Inducible Clindamycin  NEGATIVE Sensitive     * STAPHYLOCOCCUS HOMINIS  Blood Culture (routine x 2)     Status: Abnormal   Collection Time: 12/30/23  8:14 AM   Specimen: BLOOD  Result Value Ref Range Status   Specimen Description   Final    BLOOD RIGHT ANTECUBITAL Performed at [facility], [address] [place], [place] [address]    Special Requests   Final    BOTTLES DRAWN AEROBIC AND ANAEROBIC Blood Culture adequate volume Performed at [facility], [address] [place], [place] [address]    Culture  Setup Time   Final    GRAM POSITIVE COCCI IN CLUSTERS ANAEROBIC BOTTLE ONLY CRITICAL RESULT CALLED TO, READ BACK BY AND VERIFIED WITH: PHARMD N GLOGOVAC 12/31/2023 @ 0613 BY AB Performed at  [facility], [address], [place], [place] [address]    Culture STAPHYLOCOCCUS  HOMINIS (A)  Final   Report Status 01/02/2024 FINAL  Final   Organism ID, Bacteria STAPHYLOCOCCUS HOMINIS  Final      Susceptibility   Staphylococcus hominis - MIC*    CIPROFLOXACIN <=0.5 SENSITIVE Sensitive     ERYTHROMYCIN >=8 RESISTANT Resistant     GENTAMICIN <=0.5 SENSITIVE Sensitive     OXACILLIN <=0.25 SENSITIVE Sensitive     TETRACYCLINE <=1 SENSITIVE Sensitive     VANCOMYCIN  2 SENSITIVE Sensitive     TRIMETH /SULFA  <=10 SENSITIVE Sensitive     CLINDAMYCIN  >=8 RESISTANT Resistant     RIFAMPIN <=0.5 SENSITIVE Sensitive     Inducible Clindamycin  NEGATIVE Sensitive     * STAPHYLOCOCCUS HOMINIS  Blood Culture ID Panel (Reflexed)     Status: Abnormal   Collection Time: 12/30/23  8:14 AM  Result Value Ref Range Status   Enterococcus faecalis NOT DETECTED NOT DETECTED Final   Enterococcus Faecium NOT DETECTED NOT DETECTED Final   Listeria monocytogenes NOT DETECTED NOT DETECTED Final   Staphylococcus species DETECTED (A) NOT DETECTED Final    Comment: CRITICAL RESULT CALLED TO, READ BACK BY AND VERIFIED WITH: PHARMD N GLOGOVAC 12/31/2023 @ 0613 BY AB    Staphylococcus aureus (BCID) NOT DETECTED NOT DETECTED Final   Staphylococcus epidermidis NOT DETECTED NOT DETECTED Final   Staphylococcus lugdunensis NOT DETECTED NOT DETECTED Final   Streptococcus species NOT DETECTED NOT DETECTED Final   Streptococcus agalactiae NOT DETECTED NOT DETECTED Final   Streptococcus pneumoniae NOT DETECTED NOT DETECTED Final   Streptococcus pyogenes NOT DETECTED NOT DETECTED Final   A.calcoaceticus-baumannii NOT DETECTED NOT DETECTED Final   Bacteroides fragilis NOT DETECTED NOT DETECTED Final   Enterobacterales NOT DETECTED NOT DETECTED Final   Enterobacter cloacae complex NOT DETECTED NOT DETECTED Final   Escherichia coli NOT DETECTED NOT DETECTED Final   Klebsiella aerogenes NOT DETECTED NOT DETECTED Final    Klebsiella oxytoca NOT DETECTED NOT DETECTED Final   Klebsiella pneumoniae NOT DETECTED NOT DETECTED Final   Proteus species NOT DETECTED NOT DETECTED Final   Salmonella species NOT DETECTED NOT DETECTED Final   Serratia marcescens NOT DETECTED NOT DETECTED Final   Haemophilus influenzae NOT DETECTED NOT DETECTED Final   Neisseria meningitidis NOT DETECTED NOT DETECTED Final   Pseudomonas aeruginosa NOT DETECTED NOT DETECTED Final   Stenotrophomonas maltophilia NOT DETECTED NOT DETECTED Final   Candida albicans NOT DETECTED NOT DETECTED Final   Candida auris NOT DETECTED NOT DETECTED Final   Candida glabrata NOT DETECTED NOT DETECTED Final   Candida krusei NOT DETECTED NOT DETECTED Final   Candida parapsilosis NOT DETECTED NOT DETECTED Final   Candida tropicalis NOT DETECTED NOT DETECTED Final   Cryptococcus neoformans/gattii NOT DETECTED NOT DETECTED Final    Comment: Performed at [facility], [address] [place], [place] [address]  Resp panel by RT-PCR (RSV, Flu A&B, Covid) Anterior Nasal Swab     Status: None   Collection Time: 12/30/23 12:20 PM   Specimen: Anterior Nasal Swab  Result Value Ref Range Status   SARS Coronavirus 2 by RT PCR NEGATIVE NEGATIVE Final    Comment: (NOTE) SARS-CoV-2 target nucleic acids are NOT DETECTED.  The SARS-CoV-2 RNA is generally detectable in upper respiratory specimens during the acute phase of infection. The lowest concentration of SARS-CoV-2 viral copies this assay can detect is 138 copies/mL. A negative result does not preclude SARS-Cov-2 infection and should not be used as the sole basis for treatment or other patient management decisions. A negative  result may occur with  improper specimen collection/handling, submission of specimen other than nasopharyngeal swab, presence of viral mutation(s) within the areas targeted by this assay, and inadequate number of viral copies(<138 copies/mL). A negative result must be combined  with clinical observations, patient history, and epidemiological information. The expected result is Negative.  Fact Sheet for Patients:  BloggerCourse.com  Fact Sheet for Healthcare Providers:  SeriousBroker.it  This test is no t yet approved or cleared by the [place]  FDA and  has been authorized for detection and/or diagnosis of SARS-CoV-2 by FDA under an Emergency Use Authorization (EUA). This EUA will remain  in effect (meaning this test can be used) for the duration of the COVID-19 declaration under Section 564(b)(1) of the Act, 21 U.S.C.section 360bbb-3(b)(1), unless the authorization is terminated  or revoked sooner.       Influenza A by PCR NEGATIVE NEGATIVE Final   Influenza B by PCR NEGATIVE NEGATIVE Final    Comment: (NOTE) The Xpert Xpress SARS-CoV-2/FLU/RSV plus assay is intended as an aid in the diagnosis of influenza from Nasopharyngeal swab specimens and should not be used as a sole basis for treatment. Nasal washings and aspirates are unacceptable for Xpert Xpress SARS-CoV-2/FLU/RSV testing.  Fact Sheet for Patients: BloggerCourse.com  Fact Sheet for Healthcare Providers: SeriousBroker.it  This test is not yet approved or cleared by the [place]  FDA and has been authorized for detection and/or diagnosis of SARS-CoV-2 by FDA under an Emergency Use Authorization (EUA). This EUA will remain in effect (meaning this test can be used) for the duration of the COVID-19 declaration under Section 564(b)(1) of the Act, 21 U.S.C. section 360bbb-3(b)(1), unless the authorization is terminated or revoked.     Resp Syncytial Virus by PCR NEGATIVE NEGATIVE Final    Comment: (NOTE) Fact Sheet for Patients: BloggerCourse.com  Fact Sheet for Healthcare Providers: SeriousBroker.it  This test is not yet approved  or cleared by the [place]  FDA and has been authorized for detection and/or diagnosis of SARS-CoV-2 by FDA under an Emergency Use Authorization (EUA). This EUA will remain in effect (meaning this test can be used) for the duration of the COVID-19 declaration under Section 564(b)(1) of the Act, 21 U.S.C. section 360bbb-3(b)(1), unless the authorization is terminated or revoked.  Performed at [facility], [address] [place], [place] [address]   MRSA Next Gen by PCR, Nasal     Status: None   Collection Time: 12/30/23 10:44 PM   Specimen: Nasal Mucosa; Nasal Swab  Result Value Ref Range Status   MRSA by PCR Next Gen NOT DETECTED NOT DETECTED Final    Comment: (NOTE) The GeneXpert MRSA Assay (FDA approved for NASAL specimens only), is one component of a comprehensive MRSA colonization surveillance program. It is not intended to diagnose MRSA infection nor to guide or monitor treatment for MRSA infections. Test performance is not FDA approved in patients less than 91 years old. Performed at [facility], [address] [place], [place] [address]   Culture, blood (Routine X 2) w Reflex to ID Panel     Status: None (Preliminary result)   Collection Time: 01/02/24  7:48 PM   Specimen: BLOOD RIGHT ARM  Result Value Ref Range Status   Specimen Description   Final    BLOOD RIGHT ARM Performed at [facility], [address] [place], [place] [address]    Special Requests   Final    BOTTLES DRAWN AEROBIC ONLY Blood Culture results  may not be optimal due to an inadequate volume of blood received in culture bottles Performed at [facility], [address] [place], [place] [address]    Culture   Final    NO GROWTH < 12 HOURS Performed at [facility], [address], [place], [place] [address]    Report Status PENDING  Incomplete  Culture, blood (Routine X 2) w Reflex to ID Panel     Status: None (Preliminary  result)   Collection Time: 01/02/24  8:55 PM   Specimen: BLOOD LEFT ARM  Result Value Ref Range Status   Specimen Description   Final    BLOOD LEFT ARM Performed at [facility], [address], [place], [place] [address]    Special Requests   Final    BOTTLES DRAWN AEROBIC AND ANAEROBIC Blood Culture results may not be optimal due to an inadequate volume of blood received in culture bottles Performed at [facility], [address] [place], [place] [address]    Culture   Final    NO GROWTH < 12 HOURS Performed at [facility], [address] [place], [place] [address]    Report Status PENDING  Incomplete    Jomarie Fleeta Rothman, MD [facility] [phone] pager  01/03/2024, 2:21 PM

## 2024-01-03 NOTE — Progress Notes (Signed)
   [facility] has been requested to perform a transesophageal echocardiogram on [name] for bacteremia.    The patient does NOT have any absolute or relative contraindications to a Transesophageal Echocardiogram (TEE).  The patient has: No other conditions that may impact this procedure.    After careful review of history and examination, the risks and benefits of transesophageal echocardiogram have been explained including risks of esophageal damage, perforation (1:10,000 risk), bleeding, pharyngeal hematoma as well as other potential complications associated with conscious sedation including aspiration, arrhythmia, respiratory failure and death. Alternatives to treatment were discussed, questions were answered. Patient is willing to proceed.   Signed, Waddell DELENA Donath, [name]  01/03/2024 1:32 PM

## 2024-01-03 NOTE — Consult Note (Signed)
 Date of Admission:  12/30/2023          Reason for Consult: Staphylococcus hominis bacteremia rule out endocarditis    Referring Provider: Elgin Lam, MD   Assessment:  Staphylococcus hominis bacteremia rule out endocarditis History of IV drug use largely with fentanyl  having been on methadone  through methadone  clinic Recent use of snorting methamphetamine though he denies shooting it Chronic hepatitis C without hepatic coma status post cure with Epclusa  Plaque psoriasis  Plan:  Narrowed to cefazolin  Obtain TEE Follow-up repeat blood cultures on the 30th or so far without growth Check HCV RNA, hep B status, HIV Standard universal precautions  Principal Problem:   Bacteremia Active Problems:   Opiate dependence, continuous (HCC)   Psoriasis   Cellulitis of left arm   Hyponatremia   Acute encephalopathy   Scheduled Meds:  atorvastatin   20 mg Oral QHS   Chlorhexidine  Gluconate Cloth  6 each Topical Daily   enoxaparin  (LOVENOX ) injection  40 mg Subcutaneous Q24H   glipiZIDE   10 mg Oral BID AC   insulin  aspart  0-9 Units Subcutaneous TID WC   lisinopril   5 mg Oral Daily   metFORMIN   500 mg Oral Q breakfast   methadone   50 mg Oral Daily   nicotine   21 mg Transdermal Daily   pantoprazole   20 mg Oral Daily   QUEtiapine   300 mg Oral QHS   triamcinolone  0.1 % cream : eucerin   Topical TID   Continuous Infusions:   ceFAZolin  (ANCEF ) IV 2 g (01/03/24 1001)   PRN Meds:.acetaminophen , hydrOXYzine , sodium chloride  flush  HPI: [name] is a 43 y.o. male history of IV drug abuse largely fentanyl  that has periodically been in remission but which she has used more recently prior hepatitis C that is status posttreatment with Epclusa , who has been going to methadone  clinic but then recently snorted some methamphetamine and was without his methadone  and was found to be encephalopathic.  He had nausea vomiting abdominal pain as well blood cultures were taken which  grew Staphylococcus hominis in 2 out of 2 cultures taken and with identical sensitivity pattern in both.  He has had a transthoracic echocardiogram does not show evidence of endocarditis.  He was narrowed to vancomycin  and now we are narrowing him to cefazolin  now that we know that his Staphylococcus hominis is is oxacillin sensitive.   He has plaque psoriasis which has been worse recently but I do not find any evidence of cellulitis or a focal abscess on exam  We will obtain a transesophageal echocardiogram and I have called cardiology and it will be done tomorrow.  If he has infectious endocarditis he should stay in the hospital for several weeks to get parenteral antibiotics prior to consideration of oral therapy to complete treatment.  He does not have endocarditis we could give him a shorter period of antibiotics and could switch him over to oral antibiotics more quickly.  Make sure that he does not have hepatitis C reinfection and that he is immune to hepatitis B and a and that he has been screened for HIV again  I have personally spent 80 minutes involved in face-to-face and non-face-to-face activities for this patient on the day of the visit. Professional time spent includes the following activities: Preparing to see the patient (review of tests), Obtaining and/or reviewing separately obtained history (admission/discharge record), Performing a medically appropriate examination and/or evaluation , Ordering medications/tests/procedures, referring and communicating with other health care  professionals, Documenting clinical information in the EMR, Independently interpreting results (not separately reported), Communicating results to the patient/family/caregiver, Counseling and educating the patient/family/caregiver and Care coordination (not separately reported).   Evaluation of the patient requires complex antimicrobial therapy evaluation, counseling , isolation needs to reduce disease  transmission and risk assessment and mitigation.     Review of Systems: Review of Systems  Constitutional:  Negative for chills, fever, malaise/fatigue and weight loss.  HENT:  Negative for congestion and sore throat.   Eyes:  Negative for blurred vision and photophobia.  Respiratory:  Negative for cough, shortness of breath and wheezing.   Cardiovascular:  Negative for chest pain, palpitations and leg swelling.  Gastrointestinal:  Negative for abdominal pain, blood in stool, constipation, diarrhea, heartburn, melena, nausea and vomiting.  Genitourinary:  Negative for dysuria, flank pain and hematuria.  Musculoskeletal:  Negative for back pain, falls, joint pain and myalgias.  Skin:  Negative for itching and rash.  Neurological:  Negative for dizziness, focal weakness, loss of consciousness, weakness and headaches.  Endo/Heme/Allergies:  Does not bruise/bleed easily.  Psychiatric/Behavioral:  Positive for hallucinations and substance abuse. Negative for depression and suicidal ideas. The patient does not have insomnia.     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 rate 18, height 5' 11 (1.803 m), weight 111.1 kg, SpO2 100%.  Physical Exam Constitutional:      Appearance: He is well-developed.  HENT:     Head: Normocephalic and atraumatic.  Eyes:     Conjunctiva/sclera: Conjunctivae normal.  Cardiovascular:     Rate and Rhythm: Normal rate and regular rhythm.     Heart sounds: No murmur heard.    No friction rub. No gallop.  Pulmonary:     Effort: Pulmonary effort is normal. No respiratory distress.     Breath sounds: No stridor. No wheezing or rhonchi.  Abdominal:     General: There is no distension.     Palpations: Abdomen is soft. There is no mass.  Musculoskeletal:        General: No tenderness. Normal range of motion.     Cervical back: Normal range of motion and neck supple.  Skin:    General: Skin is warm and dry.     Coloration: Skin is not pale.     Findings: No erythema or rash.  Neurological:     General: No focal deficit present.     Mental Status: He is alert and oriented  to person, place, and time.  Psychiatric:        Mood and Affect: Mood normal.        Behavior: Behavior normal.        Thought Content: Thought content normal.        Judgment: Judgment normal.    Plaque psoriasis         Lab Results Lab Results  Component Value Date   WBC 12.7 (H) 01/03/2024   HGB 15.7 01/03/2024   HCT 48.1 01/03/2024   MCV 86.0 01/03/2024   PLT 221 01/03/2024    Lab Results  Component Value Date   CREATININE 0.72 01/03/2024   BUN 14 01/03/2024   NA 137 01/03/2024   K 3.5 01/03/2024   CL 101 01/03/2024   CO2 24 01/03/2024    Lab Results  Component Value Date   ALT 15 01/03/2024   AST 15 01/03/2024   ALKPHOS 71 01/03/2024   BILITOT 0.5 01/03/2024     Microbiology: Recent Results (from the past 240 hours)  Blood Culture (routine x 2)     Status: Abnormal   Collection Time: 12/30/23  8:13 AM   Specimen: BLOOD  Result Value Ref Range Status   Specimen Description   Final    BLOOD BLOOD LEFT  FOREARM Performed at [facility], [address] [place], [place] [address]    Special Requests   Final    BOTTLES DRAWN AEROBIC ONLY Blood Culture results may not be optimal due to an inadequate volume of blood received in culture bottles Performed at [facility], [address], [place], [place] [address]    Culture  Setup Time   Final    GRAM POSITIVE COCCI IN CLUSTERS AEROBIC BOTTLE ONLY CRITICAL VALUE NOTED.  VALUE IS CONSISTENT WITH PREVIOUSLY REPORTED AND CALLED VALUE. Performed at [facility], [address], [place], [place] [address]    Culture STAPHYLOCOCCUS HOMINIS (A)  Final   Report Status 01/02/2024 FINAL  Final   Organism ID, Bacteria STAPHYLOCOCCUS HOMINIS  Final      Susceptibility   Staphylococcus hominis - MIC*    CIPROFLOXACIN <=0.5 SENSITIVE Sensitive     ERYTHROMYCIN >=8 RESISTANT Resistant     GENTAMICIN <=0.5 SENSITIVE Sensitive     OXACILLIN <=0.25 SENSITIVE Sensitive     TETRACYCLINE <=1 SENSITIVE Sensitive     VANCOMYCIN  <=0.5 SENSITIVE Sensitive     TRIMETH /SULFA  <=10 SENSITIVE Sensitive     CLINDAMYCIN  >=8 RESISTANT Resistant     RIFAMPIN <=0.5 SENSITIVE Sensitive     Inducible Clindamycin  NEGATIVE Sensitive     * STAPHYLOCOCCUS HOMINIS  Blood Culture (routine x 2)     Status: Abnormal   Collection Time: 12/30/23  8:14 AM   Specimen: BLOOD  Result Value Ref Range Status   Specimen Description   Final    BLOOD RIGHT ANTECUBITAL Performed at [facility], [address] [place], [place] [address]    Special Requests   Final    BOTTLES DRAWN AEROBIC AND ANAEROBIC Blood Culture adequate volume Performed at [facility], [address] [place], [place] [address]    Culture  Setup Time   Final    GRAM POSITIVE COCCI IN CLUSTERS ANAEROBIC BOTTLE ONLY CRITICAL RESULT CALLED TO, READ BACK BY AND VERIFIED WITH: PHARMD N GLOGOVAC 12/31/2023 @ 0613 BY AB Performed at  [facility], [address], [place], [place] [address]    Culture STAPHYLOCOCCUS  HOMINIS (A)  Final   Report Status 01/02/2024 FINAL  Final   Organism ID, Bacteria STAPHYLOCOCCUS HOMINIS  Final      Susceptibility   Staphylococcus hominis - MIC*    CIPROFLOXACIN <=0.5 SENSITIVE Sensitive     ERYTHROMYCIN >=8 RESISTANT Resistant     GENTAMICIN <=0.5 SENSITIVE Sensitive     OXACILLIN <=0.25 SENSITIVE Sensitive     TETRACYCLINE <=1 SENSITIVE Sensitive     VANCOMYCIN  2 SENSITIVE Sensitive     TRIMETH /SULFA  <=10 SENSITIVE Sensitive     CLINDAMYCIN  >=8 RESISTANT Resistant     RIFAMPIN <=0.5 SENSITIVE Sensitive     Inducible Clindamycin  NEGATIVE Sensitive     * STAPHYLOCOCCUS HOMINIS  Blood Culture ID Panel (Reflexed)     Status: Abnormal   Collection Time: 12/30/23  8:14 AM  Result Value Ref Range Status   Enterococcus faecalis NOT DETECTED NOT DETECTED Final   Enterococcus Faecium NOT DETECTED NOT DETECTED Final   Listeria monocytogenes NOT DETECTED NOT DETECTED Final   Staphylococcus species DETECTED (A) NOT DETECTED Final    Comment: CRITICAL RESULT CALLED TO, READ BACK BY AND VERIFIED WITH: PHARMD N GLOGOVAC 12/31/2023 @ 0613 BY AB    Staphylococcus aureus (BCID) NOT DETECTED NOT DETECTED Final   Staphylococcus epidermidis NOT DETECTED NOT DETECTED Final   Staphylococcus lugdunensis NOT DETECTED NOT DETECTED Final   Streptococcus species NOT DETECTED NOT DETECTED Final   Streptococcus agalactiae NOT DETECTED NOT DETECTED Final   Streptococcus pneumoniae NOT DETECTED NOT DETECTED Final   Streptococcus pyogenes NOT DETECTED NOT DETECTED Final   A.calcoaceticus-baumannii NOT DETECTED NOT DETECTED Final   Bacteroides fragilis NOT DETECTED NOT DETECTED Final   Enterobacterales NOT DETECTED NOT DETECTED Final   Enterobacter cloacae complex NOT DETECTED NOT DETECTED Final   Escherichia coli NOT DETECTED NOT DETECTED Final   Klebsiella aerogenes NOT DETECTED NOT DETECTED Final    Klebsiella oxytoca NOT DETECTED NOT DETECTED Final   Klebsiella pneumoniae NOT DETECTED NOT DETECTED Final   Proteus species NOT DETECTED NOT DETECTED Final   Salmonella species NOT DETECTED NOT DETECTED Final   Serratia marcescens NOT DETECTED NOT DETECTED Final   Haemophilus influenzae NOT DETECTED NOT DETECTED Final   Neisseria meningitidis NOT DETECTED NOT DETECTED Final   Pseudomonas aeruginosa NOT DETECTED NOT DETECTED Final   Stenotrophomonas maltophilia NOT DETECTED NOT DETECTED Final   Candida albicans NOT DETECTED NOT DETECTED Final   Candida auris NOT DETECTED NOT DETECTED Final   Candida glabrata NOT DETECTED NOT DETECTED Final   Candida krusei NOT DETECTED NOT DETECTED Final   Candida parapsilosis NOT DETECTED NOT DETECTED Final   Candida tropicalis NOT DETECTED NOT DETECTED Final   Cryptococcus neoformans/gattii NOT DETECTED NOT DETECTED Final    Comment: Performed at [facility], [address] [place], [place] [address]  Resp panel by RT-PCR (RSV, Flu A&B, Covid) Anterior Nasal Swab     Status: None   Collection Time: 12/30/23 12:20 PM   Specimen: Anterior Nasal Swab  Result Value Ref Range Status   SARS Coronavirus 2 by RT PCR NEGATIVE NEGATIVE Final    Comment: (NOTE) SARS-CoV-2 target nucleic acids are NOT DETECTED.  The SARS-CoV-2 RNA is generally detectable in upper respiratory specimens during the acute phase of infection. The lowest concentration of SARS-CoV-2 viral copies this assay can detect is 138 copies/mL. A negative result does not preclude SARS-Cov-2 infection and should not be used as the sole basis for treatment or other patient management decisions. A negative  result may occur with  improper specimen collection/handling, submission of specimen other than nasopharyngeal swab, presence of viral mutation(s) within the areas targeted by this assay, and inadequate number of viral copies(<138 copies/mL). A negative result must be combined  with clinical observations, patient history, and epidemiological information. The expected result is Negative.  Fact Sheet for Patients:  BloggerCourse.com  Fact Sheet for Healthcare Providers:  SeriousBroker.it  This test is no t yet approved or cleared by the [place]  FDA and  has been authorized for detection and/or diagnosis of SARS-CoV-2 by FDA under an Emergency Use Authorization (EUA). This EUA will remain  in effect (meaning this test can be used) for the duration of the COVID-19 declaration under Section 564(b)(1) of the Act, 21 U.S.C.section 360bbb-3(b)(1), unless the authorization is terminated  or revoked sooner.       Influenza A by PCR NEGATIVE NEGATIVE Final   Influenza B by PCR NEGATIVE NEGATIVE Final    Comment: (NOTE) The Xpert Xpress SARS-CoV-2/FLU/RSV plus assay is intended as an aid in the diagnosis of influenza from Nasopharyngeal swab specimens and should not be used as a sole basis for treatment. Nasal washings and aspirates are unacceptable for Xpert Xpress SARS-CoV-2/FLU/RSV testing.  Fact Sheet for Patients: BloggerCourse.com  Fact Sheet for Healthcare Providers: SeriousBroker.it  This test is not yet approved or cleared by the [place]  FDA and has been authorized for detection and/or diagnosis of SARS-CoV-2 by FDA under an Emergency Use Authorization (EUA). This EUA will remain in effect (meaning this test can be used) for the duration of the COVID-19 declaration under Section 564(b)(1) of the Act, 21 U.S.C. section 360bbb-3(b)(1), unless the authorization is terminated or revoked.     Resp Syncytial Virus by PCR NEGATIVE NEGATIVE Final    Comment: (NOTE) Fact Sheet for Patients: BloggerCourse.com  Fact Sheet for Healthcare Providers: SeriousBroker.it  This test is not yet approved  or cleared by the [place]  FDA and has been authorized for detection and/or diagnosis of SARS-CoV-2 by FDA under an Emergency Use Authorization (EUA). This EUA will remain in effect (meaning this test can be used) for the duration of the COVID-19 declaration under Section 564(b)(1) of the Act, 21 U.S.C. section 360bbb-3(b)(1), unless the authorization is terminated or revoked.  Performed at [facility], [address] [place], [place] [address]   MRSA Next Gen by PCR, Nasal     Status: None   Collection Time: 12/30/23 10:44 PM   Specimen: Nasal Mucosa; Nasal Swab  Result Value Ref Range Status   MRSA by PCR Next Gen NOT DETECTED NOT DETECTED Final    Comment: (NOTE) The GeneXpert MRSA Assay (FDA approved for NASAL specimens only), is one component of a comprehensive MRSA colonization surveillance program. It is not intended to diagnose MRSA infection nor to guide or monitor treatment for MRSA infections. Test performance is not FDA approved in patients less than 91 years old. Performed at [facility], [address] [place], [place] [address]   Culture, blood (Routine X 2) w Reflex to ID Panel     Status: None (Preliminary result)   Collection Time: 01/02/24  7:48 PM   Specimen: BLOOD RIGHT ARM  Result Value Ref Range Status   Specimen Description   Final    BLOOD RIGHT ARM Performed at [facility], [address] [place], [place] [address]    Special Requests   Final    BOTTLES DRAWN AEROBIC ONLY Blood Culture results  may not be optimal due to an inadequate volume of blood received in culture bottles Performed at [facility], [address] [place], [place] [address]    Culture   Final    NO GROWTH < 12 HOURS Performed at [facility], [address], [place], [place] [address]    Report Status PENDING  Incomplete  Culture, blood (Routine X 2) w Reflex to ID Panel     Status: None (Preliminary  result)   Collection Time: 01/02/24  8:55 PM   Specimen: BLOOD LEFT ARM  Result Value Ref Range Status   Specimen Description   Final    BLOOD LEFT ARM Performed at [facility], [address], [place], [place] [address]    Special Requests   Final    BOTTLES DRAWN AEROBIC AND ANAEROBIC Blood Culture results may not be optimal due to an inadequate volume of blood received in culture bottles Performed at [facility], [address] [place], [place] [address]    Culture   Final    NO GROWTH < 12 HOURS Performed at [facility], [address] [place], [place] [address]    Report Status PENDING  Incomplete    Jomarie Fleeta Rothman, MD [facility] [phone] pager  01/03/2024, 2:21 PM

## 2024-01-03 NOTE — Progress Notes (Signed)
 PROGRESS NOTE    [name]  FMW:[id number] DOB: July 27, 1980 DOA: 12/30/2023 PCP: Celestia Rosaline SQUIBB, NP   Brief Narrative: [name] is a 43 y.o. male with a history of psoriasis, IV drug use, hepatitis C, GERD, heroin addiction.  Patient presented secondary to hallucinations.  Patient managed on Precedex  dementia improvement mental status.  Hospitalization complicated by evidence of Staphylococcus hominis bacteremia; vancomycin  started.  ID consulted.   Assessment and Plan:  Toxic encephalopathy Present on admission.  Concern for possible drug effect versus metabolic effect.  CT head without acute process.  Patient is managed supportively with Precedex  with eventual improvement in mental status. Resolved.  Sepsis Non present on admission.  Secondary to Staphylococcus bacteremia.  Staphylococcus hominis bacteremia Patient started on Vancomycin . Noted on blood cultures (7/). Both samples with bacteria of similar sensitivity profile. Patient with uncontrolled psoriasis with concern for cellulitis on admission, which may be source. Transthoracic Echocardiogram ordered and read is pending. -Continue Vancomycin  -ID consultation: recommendations pending -Repeat blood cultures (7/30) pending  Right arm cellulitis Noted on admission. Patient initially treated with Vancomycin , Cefepime  and Flagyl , and transitioned to Vancomycin  monotherapy. No evidence of cellulitis at this time.  Hypovolemic hyponatremia Mild hyponatremia. Stable.  Diabetes mellitus type 2 Well controlled based on hemoglobin A1C of 5.5%. -Continue SSI  Psoriasis Noted. Patient follows with dermatology as an outpatient, but has poor follow-up. Likely contributory to bacteremia. -Continue Kenalog   History of hepatitis C Per history, patient completed 12 weeks of Epclusa  (missed 1-2 doses). Last Hep C RNA undetectable  Hyperlipidemia -Continue Lipitor  Prolonged QTc Noted; borderline. Patient is on  methadone .    DVT prophylaxis: Lovenox  Code Status:   Code Status: Full Code Family Communication: None at bedside Disposition Plan: Discharge pending ID recommendations   Consultants:  Infectious disease  Procedures:  Transthoracic Echocardiogram  Antimicrobials: Vancomycin     Subjective: Continues to feel well. Frustrated that his psoriasis was not being treated as aggressive as he feels it should be as an outpatient.   Objective: BP 121/72 (BP Location: Right Arm)   Pulse 82   Temp 98 F (36.7 C) (Oral)   Resp 18   Ht 5' 11 (1.803 m)   Wt 111.1 kg   SpO2 100%   BMI 34.16 kg/m   Examination:  General exam: Appears calm and comfortable Respiratory system: Clear to auscultation. Respiratory effort normal. Cardiovascular system: S1 & S2 heard, RRR. No murmurs, rubs, gallops or clicks. Gastrointestinal system: Abdomen is nondistended, soft and nontender. Normal bowel sounds heard. Central nervous system: Alert and oriented. No focal neurological deficits. Musculoskeletal: No edema. No calf tenderness Skin: Clusters of papular rashes located on extremities and back Psychiatry: Judgement and insight appear normal. Mood & affect appropriate.    Data Reviewed: I have personally reviewed following labs and imaging studies  CBC Lab Results  Component Value Date   WBC 12.7 (H) 01/03/2024   RBC 5.59 01/03/2024   HGB 15.7 01/03/2024   HCT 48.1 01/03/2024   MCV 86.0 01/03/2024   MCH 28.1 01/03/2024   PLT 221 01/03/2024   MCHC 32.6 01/03/2024   RDW 13.5 01/03/2024   LYMPHSABS 3.3 12/30/2023   MONOABS 0.8 12/30/2023   EOSABS 0.0 12/30/2023   BASOSABS 0.1 12/30/2023     Last metabolic panel Lab Results  Component Value Date   NA 137 01/03/2024   K 3.5 01/03/2024   CL 101 01/03/2024   CO2 24 01/03/2024   BUN 14 01/03/2024  CREATININE 0.72 01/03/2024   GLUCOSE 90 01/03/2024   GFRNONAA >60 01/03/2024   GFRAA >60 03/27/2017   CALCIUM  9.2 01/03/2024    PROT 8.5 (H) 01/03/2024   ALBUMIN 3.3 (L) 01/03/2024   BILITOT 0.5 01/03/2024   ALKPHOS 71 01/03/2024   AST 15 01/03/2024   ALT 15 01/03/2024   ANIONGAP 12 01/03/2024    GFR: Estimated Creatinine Clearance: 150.9 mL/min (by C-G formula based on SCr of 0.72 mg/dL).   Radiology Studies: ECHOCARDIOGRAM COMPLETE Result Date: 01/02/2024    ECHOCARDIOGRAM REPORT   Patient Name:   [name] Date of Exam: 01/02/2024 Medical Rec #:  [id number]      Height:       71.0 in Accession #:    [id number]     Weight:       244.9 lb Date of Birth:  [birth year]      BSA:          2.298 m Patient Age:    43 years       BP:           120/78 mmHg Patient Gender: M              HR:           70 bpm. Exam Location:  Inpatient Procedure: 2D Echo, 3D Echo, Cardiac Doppler, Color Doppler and Strain Analysis            (Both Spectral and Color Flow Doppler were utilized during            procedure). Indications:    Bacteremia R78.81  History:        Patient has no prior history of Echocardiogram examinations.                 Signs/Symptoms:Bacteremia; Risk Factors:Polysubstance Abuse and                 Current Smoker.  Sonographer:    Koleen Popper RDCS Referring Phys: [id number] ELSIE JAYSON MONTCLAIR  Sonographer Comments: Global longitudinal strain was attempted. IMPRESSIONS  1. Left ventricular ejection fraction, by estimation, is 60 to 65%. Left ventricular ejection fraction by 3D volume is 60 %. The left ventricle has normal function. The left ventricle has no regional wall motion abnormalities. Left ventricular diastolic  parameters were normal. The average left ventricular global longitudinal strain is -18.1 %. The global longitudinal strain is normal.  2. Right ventricular systolic function is normal. The right ventricular size is normal.  3. The mitral valve is normal in structure. No evidence of mitral valve regurgitation. No evidence of mitral stenosis.  4. The aortic valve is normal in structure. Aortic valve  regurgitation is not visualized. No aortic stenosis is present.  5. The inferior vena cava is normal in size with greater than 50% respiratory variability, suggesting right atrial pressure of 3 mmHg. Conclusion(s)/Recommendation(s): No evidence of valvular vegetations on this transthoracic echocardiogram. Consider a transesophageal echocardiogram to exclude infective endocarditis if clinically indicated. FINDINGS  Left Ventricle: Left ventricular ejection fraction, by estimation, is 60 to 65%. Left ventricular ejection fraction by 3D volume is 60 %. The left ventricle has normal function. The left ventricle has no regional wall motion abnormalities. The average left ventricular global longitudinal strain is -18.1 %. Strain was performed and the global longitudinal strain is normal. The left ventricular internal cavity size was normal in size. There is no left ventricular hypertrophy. Left ventricular diastolic parameters were normal. Right Ventricle: The right ventricular size  is normal. No increase in right ventricular wall thickness. Right ventricular systolic function is normal. Left Atrium: Left atrial size was normal in size. Right Atrium: Right atrial size was normal in size. Pericardium: There is no evidence of pericardial effusion. Mitral Valve: The mitral valve is normal in structure. No evidence of mitral valve regurgitation. No evidence of mitral valve stenosis. Tricuspid Valve: The tricuspid valve is normal in structure. Tricuspid valve regurgitation is trivial. No evidence of tricuspid stenosis. Aortic Valve: The aortic valve is normal in structure. Aortic valve regurgitation is not visualized. No aortic stenosis is present. Pulmonic Valve: The pulmonic valve was normal in structure. Pulmonic valve regurgitation is not visualized. No evidence of pulmonic stenosis. Aorta: The aortic root is normal in size and structure. Venous: The inferior vena cava is normal in size with greater than 50% respiratory  variability, suggesting right atrial pressure of 3 mmHg. IAS/Shunts: No atrial level shunt detected by color flow Doppler. Additional Comments: 3D was performed not requiring image post processing on an independent workstation and was normal.  LEFT VENTRICLE PLAX 2D LVIDd:         4.50 cm         Diastology LVIDs:         3.10 cm         LV e' medial:    10.10 cm/s LV PW:         1.00 cm         LV E/e' medial:  8.8 LV IVS:        1.20 cm         LV e' lateral:   14.10 cm/s LVOT diam:     2.30 cm         LV E/e' lateral: 6.3 LV SV:         94 LV SV Index:   41              2D Longitudinal LVOT Area:     4.15 cm        Strain                                2D Strain GLS   -18.1 %                                Avg:                                 3D Volume EF                                LV 3D EF:    Left                                             ventricul                                             ar  ejection                                             fraction                                             by 3D                                             volume is                                             60 %.                                 3D Volume EF:                                3D EF:        60 %                                LV EDV:       148 ml                                LV ESV:       59 ml                                LV SV:        88 ml RIGHT VENTRICLE             IVC RV Basal diam:  4.10 cm     IVC diam: 1.80 cm RV Mid diam:    3.50 cm RV S prime:     12.10 cm/s TAPSE (M-mode): 2.8 cm LEFT ATRIUM             Index        RIGHT ATRIUM           Index LA diam:        3.70 cm 1.61 cm/m   RA Area:     21.50 cm LA Vol (A2C):   38.5 ml 16.75 ml/m  RA Volume:   62.80 ml  27.33 ml/m LA Vol (A4C):   42.0 ml 18.28 ml/m LA Biplane Vol: 39.9 ml 17.36 ml/m  AORTIC VALVE LVOT Vmax:   126.00 cm/s LVOT Vmean:  81.500 cm/s LVOT VTI:    0.227 m  AORTA Ao  Root diam: 3.60 cm Ao Asc diam:  3.80 cm MITRAL VALVE MV Area (PHT): 3.42 cm    SHUNTS MV Decel Time: 222 msec    Systemic VTI:  0.23 m MV E velocity: 89.10 cm/s  Systemic Diam: 2.30 cm MV A velocity:  55.70 cm/s MV E/A ratio:  1.60 [name] Electronically signed by [name] Signature Date/Time: 01/02/2024/6:28:46 PM    Final       LOS: 4 days    Elgin Lam, MD [facility] 01/03/2024, 1:47 PM   If 7PM-7AM, please contact night-coverage [email or web address]

## 2024-01-04 ENCOUNTER — Encounter (HOSPITAL_COMMUNITY)
Admission: EM | Disposition: A | Discharge status: Home / Self Care | Payer: Self-pay | Source: Home / Self Care | Attending: Family Medicine

## 2024-01-04 ENCOUNTER — Other Ambulatory Visit (HOSPITAL_COMMUNITY): Payer: Self-pay

## 2024-01-04 ENCOUNTER — Inpatient Hospital Stay (HOSPITAL_COMMUNITY): Payer: MEDICAID | Admitting: Anesthesiology

## 2024-01-04 ENCOUNTER — Telehealth (HOSPITAL_COMMUNITY): Payer: Self-pay | Admitting: Pharmacy Technician

## 2024-01-04 ENCOUNTER — Other Ambulatory Visit (HOSPITAL_BASED_OUTPATIENT_CLINIC_OR_DEPARTMENT_OTHER): Payer: Self-pay

## 2024-01-04 ENCOUNTER — Inpatient Hospital Stay (HOSPITAL_COMMUNITY): Payer: MEDICAID

## 2024-01-04 DIAGNOSIS — I38 Endocarditis, valve unspecified: Secondary | ICD-10-CM

## 2024-01-04 DIAGNOSIS — R7881 Bacteremia: Secondary | ICD-10-CM | POA: Diagnosis not present

## 2024-01-04 DIAGNOSIS — I330 Acute and subacute infective endocarditis: Secondary | ICD-10-CM

## 2024-01-04 HISTORY — PX: TRANSESOPHAGEAL ECHOCARDIOGRAM (CATH LAB): EP1270

## 2024-01-04 LAB — COMPREHENSIVE METABOLIC PANEL WITH GFR
ALT: 13 U/L (ref 0–44)
AST: 12 U/L — ABNORMAL LOW (ref 15–41)
Albumin: 3.2 g/dL — ABNORMAL LOW (ref 3.5–5.0)
Alkaline Phosphatase: 66 U/L (ref 38–126)
Anion gap: 9 (ref 5–15)
BUN: 14 mg/dL (ref 6–20)
CO2: 24 mmol/L (ref 22–32)
Calcium: 8.8 mg/dL — ABNORMAL LOW (ref 8.9–10.3)
Chloride: 102 mmol/L (ref 98–111)
Creatinine, Ser: 0.82 mg/dL (ref 0.61–1.24)
GFR, Estimated: >60 mL/min (ref >60)
Glucose, Bld: 103 mg/dL — ABNORMAL HIGH (ref 70–99)
Potassium: 3.1 mmol/L — ABNORMAL LOW (ref 3.5–5.1)
Sodium: 135 mmol/L (ref 135–145)
Total Bilirubin: 0.5 mg/dL (ref 0.0–1.2)
Total Protein: 7.8 g/dL (ref 6.5–8.1)

## 2024-01-04 LAB — CBC
HCT: 44.4 % (ref 39.0–52.0)
Hemoglobin: 14.9 g/dL (ref 13.0–17.0)
MCH: 28.5 pg (ref 26.0–34.0)
MCHC: 33.6 g/dL (ref 30.0–36.0)
MCV: 84.9 fL (ref 80.0–100.0)
Platelets: 176 K/uL (ref 150–400)
RBC: 5.23 MIL/uL (ref 4.22–5.81)
RDW: 13.5 % (ref 11.5–15.5)
WBC: 11.5 K/uL — ABNORMAL HIGH (ref 4.0–10.5)
nRBC: 0 % (ref 0.0–0.2)

## 2024-01-04 LAB — ECHO TEE

## 2024-01-04 LAB — GLUCOSE, CAPILLARY
Glucose-Capillary: 104 mg/dL — ABNORMAL HIGH (ref 70–99)
Glucose-Capillary: 105 mg/dL — ABNORMAL HIGH (ref 70–99)
Glucose-Capillary: 93 mg/dL (ref 70–99)

## 2024-01-04 LAB — HEPATITIS A ANTIBODY, TOTAL: hep A Total Ab: NONREACTIVE

## 2024-01-04 LAB — HEPATITIS B SURFACE ANTIGEN: Hepatitis B Surface Ag: NONREACTIVE

## 2024-01-04 SURGERY — TRANSESOPHAGEAL ECHOCARDIOGRAM (TEE) (CATHLAB)
Anesthesia: Monitor Anesthesia Care

## 2024-01-04 MED ORDER — LINEZOLID 600 MG PO TABS
600.0000 mg | ORAL_TABLET | Freq: Two times a day (BID) | ORAL | 0 refills | Status: AC
  Filled 2024-01-04: qty 24, 12d supply, fill #0

## 2024-01-04 MED ORDER — PROPOFOL 10 MG/ML IV BOLUS
INTRAVENOUS | Status: DC | PRN
Start: 2024-01-04 — End: 2024-01-04
  Administered 2024-01-04 (×2): 50 mg via INTRAVENOUS
  Administered 2024-01-04: 100 ug/kg/min via INTRAVENOUS

## 2024-01-04 NOTE — Telephone Encounter (Signed)
 Patient Product/process development scientist completed.    The patient is insured through [employer] [place].     Ran test claim for linezolid  600 mg and the current 30 day co-pay is $4.00.   This test claim was processed through [facility]- copay amounts may vary at other pharmacies due to pharmacy/plan contracts, or as the patient moves through the different stages of their insurance plan.     Reyes Sharps, CPHT Pharmacy Technician III Certified Patient Advocate [facility] Patient Advocate Team Direct Number: [phone]  Fax: [phone]

## 2024-01-04 NOTE — Progress Notes (Signed)
      INFECTIOUS DISEASE ATTENDING ADDENDUM:   Date: 01/04/2024  Patient name: [name]  Medical record number: [id number]  Date of birth: 1981-04-09   TEE is clean no endocarditis  When he recovers from anethesia etc he can be DC on zyvox  600mg  po Q 12 hours   This would be 12 more days of antibiotics.  Jomarie Fleeta Rothman 01/04/2024, 1:59 PM

## 2024-01-04 NOTE — TOC Transition Note (Signed)
 Transition of Care [facility]) - Discharge Note   Patient Details  Name: [name] MRN: [id number] Date of Birth: 08-28-1980  [facility] CM/SW Contact:  Sonda Manuella Quill, RN Phone Number: 01/04/2024, 4:10 PM   Clinical Narrative:    D/C orders received; no TOC needs.   Final next level of care: Home/Self Care Barriers to Discharge: No Barriers Identified   Patient Goals and CMS Choice Patient states their goals for this hospitalization and ongoing recovery are:: Home CMS Medicare.gov Compare Post Acute Care list provided to:: Other (Comment Required) (NA) Choice offered to / list presented to : NA [place] ownership interest in [facility].provided to:: Parent NA    Discharge Placement                       Discharge Plan and Services Additional resources added to the After Visit Summary for   In-house Referral: NA Discharge Planning Services: CM Consult Post Acute Care Choice:  (TBD)          DME Arranged: N/A DME Agency: NA       HH Arranged: NA HH Agency: NA        Social Drivers of Health (SDOH) Interventions SDOH Screenings   Food Insecurity: No Food Insecurity (12/30/2023)  Housing: Low Risk  (12/30/2023)  Transportation Needs: No Transportation Needs (12/30/2023)  Utilities: Not At Risk (12/30/2023)  Depression (PHQ2-9): Low Risk  (08/16/2022)  Tobacco Use: High Risk (12/30/2023)     Readmission Risk Interventions    12/31/2023    1:09 PM  Readmission Risk Prevention Plan  Transportation Screening Complete  PCP or Specialist Appt within 5-7 Days Complete  Home Care Screening Complete  Medication Review (RN CM) Complete

## 2024-01-04 NOTE — Interval H&P Note (Signed)
 History and Physical Interval Note:  01/04/2024 12:33 PM  [name]  has presented today for surgery, with the diagnosis of bacteremia.  The various methods of treatment have been discussed with the patient and family. After consideration of risks, benefits and other options for treatment, the patient has consented to  Procedure(s): TRANSESOPHAGEAL ECHOCARDIOGRAM (N/A) as a surgical intervention.  The patient's history has been reviewed, patient examined, no change in status, stable for surgery.  I have reviewed the patient's chart and labs.  Questions were answered to the patient's satisfaction.     Majed Pellegrin

## 2024-01-04 NOTE — Discharge Summary (Signed)
 Physician Discharge Summary   Patient: [name] MRN: [id number] DOB: May 10, 1981  Admit date:     12/30/2023  Discharge date: 01/04/24  Discharge Physician: Elgin Lam, MD   PCP: Celestia Rosaline SQUIBB, NP   Recommendations at discharge:  PCP visit for hospital follow-up ID visit for hospital follow-up  Discharge Diagnoses: Principal Problem:   Bacteremia Active Problems:   Opiate dependence, continuous (HCC)   Psoriasis   Cellulitis of left arm   Hyponatremia   Acute encephalopathy  Resolved Problems:   * No resolved hospital problems. *  Hospital Course: [name] is a 43 y.o. male with a history of psoriasis, IV drug use, hepatitis C, GERD, heroin addiction.  Patient presented secondary to hallucinations.  Patient managed on Precedex  dementia improvement mental status.  Hospitalization complicated by evidence of Staphylococcus hominis bacteremia; vancomycin  started.  ID consulted and recommended endocarditis workup, which was negative. Patient transitioned to Linezolid  to complete a 2-week outpatient treatment.  Assessment and Plan:  Toxic encephalopathy Present on admission.  Concern for possible drug effect versus metabolic effect.  CT head without acute process.  Patient is managed supportively with Precedex  with eventual improvement in mental status. Resolved.   Sepsis Non present on admission.  Secondary to Staphylococcus bacteremia.   Staphylococcus hominis bacteremia Patient started on Vancomycin . Noted on blood cultures (7/). Both samples with bacteria of similar sensitivity profile. Patient with uncontrolled psoriasis with concern for cellulitis on admission, which may be source. Transthoracic Echocardiogram and Transesophageal Echocardiogram significant for no evidence of endocarditis. Patient transitioned to Cefazolin  IV while inpatient and discharged on Linezolid  to complete a 2-week course from the date of negative blood cultures (7/30).   Right arm  cellulitis Noted on admission. Patient initially treated with Vancomycin , Cefepime  and Flagyl , and transitioned to Vancomycin  monotherapy. No evidence of cellulitis at this time.   Hypovolemic hyponatremia Mild hyponatremia. Stable.   Diabetes mellitus type 2 Well controlled based on hemoglobin A1C of 5.5%. Continue metformin . Patient with some hypoglycemia, so will recommend to hold glipizide  on discharge.   Psoriasis Noted. Patient follows with dermatology as an outpatient, but has poor follow-up. Likely contributory to bacteremia. Patient will need to follow-up with PCP.   History of hepatitis C Per history, patient completed 12 weeks of Epclusa  (missed 1-2 doses). Last Hep C RNA undetectable   Hyperlipidemia Continue Lipitor   Prolonged QTc Noted; borderline. Patient is on methadone .   Consultants:  Infectious disease   Procedures:  Transthoracic Echocardiogram Transesophageal Echocardiogram  Disposition: Home Diet recommendation: Carb modified diet   DISCHARGE MEDICATION: Allergies as of 01/04/2024   No Known Allergies      Medication List     PAUSE taking these medications    glipiZIDE  10 MG tablet Wait to take this until your doctor or other care provider tells you to start again. Commonly known as: GLUCOTROL  Take 1 tablet (10 mg total) by mouth 2 (two) times daily before a meal.       STOP taking these medications    Sofosbuvir -Velpatasvir  400-100 MG Tabs Commonly known as: Epclusa        TAKE these medications    hydrochlorothiazide 25 MG tablet Commonly known as: HYDRODIURIL Take 25 mg by mouth daily.   linezolid  600 MG tablet Commonly known as: ZYVOX  Take 1 tablet (600 mg total) by mouth 2 (two) times daily for 12 days.   Lipitor 20 MG tablet Generic drug: atorvastatin  Take 20 mg by mouth at bedtime.   lisinopril  5  MG tablet Commonly known as: ZESTRIL  Take 1 tablet (5 mg total) by mouth daily.   metFORMIN  1000 MG tablet Commonly  known as: GLUCOPHAGE  Take 1,000 mg by mouth 2 (two) times daily with a meal.   methadone  1 mg/ml oral solution Commonly known as: DOLOPHINE  Take 57 mg by mouth daily.   Protonix  20 MG tablet Generic drug: pantoprazole  Take 20 mg by mouth daily.   QUEtiapine  300 MG tablet Commonly known as: SEROQUEL  Take 300 mg by mouth at bedtime.   triamcinolone  cream 0.1 % Commonly known as: KENALOG    Ventolin HFA 108 (90 Base) MCG/ACT inhaler Generic drug: albuterol Inhale 2 puffs into the lungs every 4 (four) hours as needed.   Vistaril  50 MG capsule Generic drug: hydrOXYzine  Take 50 mg by mouth 3 (three) times daily as needed.   Vitamin D3 50 MCG (2000 UT) capsule TAKE ONE CAPSULE EACH DAY        Discharge Exam: BP 112/87 (BP Location: Right Arm)   Pulse (!) 57   Temp 97.6 F (36.4 C) (Oral)   Resp 18   Ht 5' 11 (1.803 m)   Wt 104.3 kg   SpO2 100%   BMI 32.08 kg/m   General exam: Appears calm and comfortable Respiratory system: Clear to auscultation. Respiratory effort normal. Cardiovascular system: S1 & S2 heard, RRR. No murmurs. Gastrointestinal system: Abdomen is nondistended, soft and nontender. Normal bowel sounds heard. Central nervous system: Alert and oriented. No focal neurological deficits. Psychiatry: Judgement and insight appear normal. Mood & affect appropriate.   Condition at discharge: stable  The results of significant diagnostics from this hospitalization (including imaging, microbiology, ancillary and laboratory) are listed below for reference.   Imaging Studies: ECHOCARDIOGRAM COMPLETE Result Date: 01/02/2024    ECHOCARDIOGRAM REPORT   Patient Name:   [name] [name] Date of Exam: 01/02/2024 Medical Rec #:  [id number]      Height:       71.0 in Accession #:    [id number]     Weight:       244.9 lb Date of Birth:  January 07, 1981      BSA:          2.298 m Patient Age:    43 years       BP:           120/78 mmHg Patient Gender: M              HR:           70  bpm. Exam Location:  Inpatient Procedure: 2D Echo, 3D Echo, Cardiac Doppler, Color Doppler and Strain Analysis            (Both Spectral and Color Flow Doppler were utilized during            procedure). Indications:    Bacteremia R78.81  History:        Patient has no prior history of Echocardiogram examinations.                 Signs/Symptoms:Bacteremia; Risk Factors:Polysubstance Abuse and                 Current Smoker.  Sonographer:    Koleen Popper RDCS Referring Phys: [id number] [name]  Sonographer Comments: Global longitudinal strain was attempted. IMPRESSIONS  1. Left ventricular ejection fraction, by estimation, is 60 to 65%. Left ventricular ejection fraction by 3D volume is 60 %. The left ventricle has normal function. The left ventricle has no  regional wall motion abnormalities. Left ventricular diastolic  parameters were normal. The average left ventricular global longitudinal strain is -18.1 %. The global longitudinal strain is normal.  2. Right ventricular systolic function is normal. The right ventricular size is normal.  3. The mitral valve is normal in structure. No evidence of mitral valve regurgitation. No evidence of mitral stenosis.  4. The aortic valve is normal in structure. Aortic valve regurgitation is not visualized. No aortic stenosis is present.  5. The inferior vena cava is normal in size with greater than 50% respiratory variability, suggesting right atrial pressure of 3 mmHg. Conclusion(s)/Recommendation(s): No evidence of valvular vegetations on this transthoracic echocardiogram. Consider a transesophageal echocardiogram to exclude infective endocarditis if clinically indicated. FINDINGS  Left Ventricle: Left ventricular ejection fraction, by estimation, is 60 to 65%. Left ventricular ejection fraction by 3D volume is 60 %. The left ventricle has normal function. The left ventricle has no regional wall motion abnormalities. The average left ventricular global longitudinal  strain is -18.1 %. Strain was performed and the global longitudinal strain is normal. The left ventricular internal cavity size was normal in size. There is no left ventricular hypertrophy. Left ventricular diastolic parameters were normal. Right Ventricle: The right ventricular size is normal. No increase in right ventricular wall thickness. Right ventricular systolic function is normal. Left Atrium: Left atrial size was normal in size. Right Atrium: Right atrial size was normal in size. Pericardium: There is no evidence of pericardial effusion. Mitral Valve: The mitral valve is normal in structure. No evidence of mitral valve regurgitation. No evidence of mitral valve stenosis. Tricuspid Valve: The tricuspid valve is normal in structure. Tricuspid valve regurgitation is trivial. No evidence of tricuspid stenosis. Aortic Valve: The aortic valve is normal in structure. Aortic valve regurgitation is not visualized. No aortic stenosis is present. Pulmonic Valve: The pulmonic valve was normal in structure. Pulmonic valve regurgitation is not visualized. No evidence of pulmonic stenosis. Aorta: The aortic root is normal in size and structure. Venous: The inferior vena cava is normal in size with greater than 50% respiratory variability, suggesting right atrial pressure of 3 mmHg. IAS/Shunts: No atrial level shunt detected by color flow Doppler. Additional Comments: 3D was performed not requiring image post processing on an independent workstation and was normal.  LEFT VENTRICLE PLAX 2D LVIDd:         4.50 cm         Diastology LVIDs:         3.10 cm         LV e' medial:    10.10 cm/s LV PW:         1.00 cm         LV E/e' medial:  8.8 LV IVS:        1.20 cm         LV e' lateral:   14.10 cm/s LVOT diam:     2.30 cm         LV E/e' lateral: 6.3 LV SV:         94 LV SV Index:   41              2D Longitudinal LVOT Area:     4.15 cm        Strain                                2D Strain GLS   -  18.1 %                                 Avg:                                 3D Volume EF                                LV 3D EF:    Left                                             ventricul                                             ar                                             ejection                                             fraction                                             by 3D                                             volume is                                             60 %.                                 3D Volume EF:                                3D EF:        60 %                                LV EDV:       148 ml                                LV ESV:       59 ml  LV SV:        88 ml RIGHT VENTRICLE             IVC RV Basal diam:  4.10 cm     IVC diam: 1.80 cm RV Mid diam:    3.50 cm RV S prime:     12.10 cm/s TAPSE (M-mode): 2.8 cm LEFT ATRIUM             Index        RIGHT ATRIUM           Index LA diam:        3.70 cm 1.61 cm/m   RA Area:     21.50 cm LA Vol (A2C):   38.5 ml 16.75 ml/m  RA Volume:   62.80 ml  27.33 ml/m LA Vol (A4C):   42.0 ml 18.28 ml/m LA Biplane Vol: 39.9 ml 17.36 ml/m  AORTIC VALVE LVOT Vmax:   126.00 cm/s LVOT Vmean:  81.500 cm/s LVOT VTI:    0.227 m  AORTA Ao Root diam: 3.60 cm Ao Asc diam:  3.80 cm MITRAL VALVE MV Area (PHT): 3.42 cm    SHUNTS MV Decel Time: 222 msec    Systemic VTI:  0.23 m MV E velocity: 89.10 cm/s  Systemic Diam: 2.30 cm MV A velocity: 55.70 cm/s MV E/A ratio:  1.60 Morene Brownie Electronically signed by Morene Brownie Signature Date/Time: 01/02/2024/6:28:46 PM    Final    CT ABDOMEN PELVIS W CONTRAST Result Date: 12/30/2023 CLINICAL DATA:  Sepsis. EXAM: CT ABDOMEN AND PELVIS WITH CONTRAST TECHNIQUE: Multidetector CT imaging of the abdomen and pelvis was performed using the standard protocol following bolus administration of intravenous contrast. RADIATION DOSE REDUCTION: This exam was performed according to the departmental  dose-optimization program which includes automated exposure control, adjustment of the mA and/or kV according to patient size and/or use of iterative reconstruction technique. CONTRAST:  OMNIPAQUE  IOHEXOL  300 MG/ML  SOLN COMPARISON:  None Available. FINDINGS: Evaluation of this exam is limited due to respiratory motion. Lower chest: The visualized lung bases are clear. No intra-abdominal free air or free fluid. Hepatobiliary: Subcentimeter hypodense lesion in the right lobe of the liver is too small to characterize. No biliary dilatation. The gallbladder is unremarkable. Pancreas: Unremarkable. No pancreatic ductal dilatation or surrounding inflammatory changes. Spleen: Normal in size without focal abnormality. Adrenals/Urinary Tract: The adrenal glands unremarkable. There is a 2 cm left renal inferior pole exophytic cyst. There is no hydronephrosis on either side. The visualized ureters and urinary bladder appear unremarkable. Stomach/Bowel: There is no bowel obstruction or active inflammation. The appendix is normal. Vascular/Lymphatic: Mild aortoiliac atherosclerotic disease. The IVC is unremarkable. No portal venous gas. There is no adenopathy. Reproductive: The prostate and seminal vesicles are grossly remarkable. No pelvic mass. Other: None Musculoskeletal: No acute osseous pathology. IMPRESSION: 1. No acute intra-abdominal or pelvic pathology. 2.  Aortic Atherosclerosis (ICD10-I70.0). Electronically Signed   By: Vanetta Chou M.D.   On: 12/30/2023 10:42   CT Head Wo Contrast Result Date: 12/30/2023 EXAM: CT HEAD WITHOUT CONTRAST 12/30/2023 09:57:07 AM TECHNIQUE: CT of the head was performed without the administration of intravenous contrast. Automated exposure control, iterative reconstruction, and/or weight based adjustment of the mA/kV was utilized to reduce the radiation dose to as low as reasonably achievable. COMPARISON: CT head without contrast 05/29/2006. CLINICAL HISTORY: Mental status  change, unknown cause. Patient brought to ED by father for evaluation of hallucinating and coming off methadone . Father reports last dose of  methadone  was yesterday. FINDINGS: BRAIN AND VENTRICLES: No acute hemorrhage. Gray-Hari differentiation is preserved. No hydrocephalus. No extra-axial collection. No mass effect or midline shift. ORBITS: No acute abnormality. SINUSES: No acute abnormality. SOFT TISSUES AND SKULL: No acute soft tissue abnormality. No skull fracture. IMPRESSION: 1. No acute intracranial abnormality. Electronically signed by: Lonni Necessary MD 12/30/2023 10:18 AM EDT RP Workstation: [id number]   DG Chest Port 1 View Result Date: 12/30/2023 CLINICAL DATA:  Questionable sepsis. EXAM: PORTABLE CHEST 1 VIEW COMPARISON:  Chest radiograph dated 03/24/2017. FINDINGS: The heart size and mediastinal contours are within normal limits. Both lungs are clear. The visualized skeletal structures are unremarkable. IMPRESSION: No active disease. Electronically Signed   By: Vanetta Chou M.D.   On: 12/30/2023 09:21    Microbiology: Results for orders placed or performed during the hospital encounter of 12/30/23  Blood Culture (routine x 2)     Status: Abnormal   Collection Time: 12/30/23  8:13 AM   Specimen: BLOOD  Result Value Ref Range Status   Specimen Description   Final    BLOOD BLOOD LEFT FOREARM Performed at [facility], [address] [place], [place] [address]    Special Requests   Final    BOTTLES DRAWN AEROBIC ONLY Blood Culture results may not be optimal due to an inadequate volume of blood received in culture bottles Performed at [facility], [address] [place], [place] [address]    Culture  Setup Time   Final    GRAM POSITIVE COCCI IN CLUSTERS AEROBIC BOTTLE ONLY CRITICAL VALUE NOTED.  VALUE IS CONSISTENT WITH PREVIOUSLY REPORTED AND CALLED VALUE. Performed at [facility], [address], [place], [place]  [address]    Culture STAPHYLOCOCCUS HOMINIS (A)  Final   Report Status 01/02/2024 FINAL  Final   Organism ID, Bacteria STAPHYLOCOCCUS HOMINIS  Final      Susceptibility   Staphylococcus hominis - MIC*    CIPROFLOXACIN <=0.5 SENSITIVE Sensitive     ERYTHROMYCIN >=8 RESISTANT Resistant     GENTAMICIN <=0.5 SENSITIVE Sensitive     OXACILLIN <=0.25 SENSITIVE Sensitive     TETRACYCLINE <=1 SENSITIVE Sensitive     VANCOMYCIN  <=0.5 SENSITIVE Sensitive     TRIMETH /SULFA  <=10 SENSITIVE Sensitive     CLINDAMYCIN  >=8 RESISTANT Resistant     RIFAMPIN <=0.5 SENSITIVE Sensitive     Inducible Clindamycin  NEGATIVE Sensitive     * STAPHYLOCOCCUS HOMINIS  Blood Culture (routine x 2)     Status: Abnormal   Collection Time: 12/30/23  8:14 AM   Specimen: BLOOD  Result Value Ref Range Status   Specimen Description   Final    BLOOD RIGHT ANTECUBITAL Performed at [facility], [address], [place], [place] [address]    Special Requests   Final    BOTTLES DRAWN AEROBIC AND ANAEROBIC Blood Culture adequate volume Performed at [facility], [address] [place], [place] [address]    Culture  Setup Time   Final    GRAM POSITIVE COCCI IN CLUSTERS ANAEROBIC BOTTLE ONLY CRITICAL RESULT CALLED TO, READ BACK BY AND VERIFIED WITH: PHARMD N GLOGOVAC 12/31/2023 @ 0613 BY AB Performed at [facility], [address] [place], [place] [address]    Culture STAPHYLOCOCCUS HOMINIS (A)  Final   Report Status 01/02/2024 FINAL  Final   Organism ID, Bacteria STAPHYLOCOCCUS HOMINIS  Final      Susceptibility   Staphylococcus hominis - MIC*  CIPROFLOXACIN <=0.5 SENSITIVE Sensitive     ERYTHROMYCIN >=8 RESISTANT Resistant     GENTAMICIN <=0.5 SENSITIVE Sensitive     OXACILLIN <=0.25 SENSITIVE Sensitive     TETRACYCLINE <=1 SENSITIVE Sensitive     VANCOMYCIN  2 SENSITIVE Sensitive     TRIMETH /SULFA  <=10 SENSITIVE Sensitive     CLINDAMYCIN  >=8 RESISTANT Resistant      RIFAMPIN <=0.5 SENSITIVE Sensitive     Inducible Clindamycin  NEGATIVE Sensitive     * STAPHYLOCOCCUS HOMINIS  Blood Culture ID Panel (Reflexed)     Status: Abnormal   Collection Time: 12/30/23  8:14 AM  Result Value Ref Range Status   Enterococcus faecalis NOT DETECTED NOT DETECTED Final   Enterococcus Faecium NOT DETECTED NOT DETECTED Final   Listeria monocytogenes NOT DETECTED NOT DETECTED Final   Staphylococcus species DETECTED (A) NOT DETECTED Final    Comment: CRITICAL RESULT CALLED TO, READ BACK BY AND VERIFIED WITH: PHARMD N GLOGOVAC 12/31/2023 @ 0613 BY AB    Staphylococcus aureus (BCID) NOT DETECTED NOT DETECTED Final   Staphylococcus epidermidis NOT DETECTED NOT DETECTED Final   Staphylococcus lugdunensis NOT DETECTED NOT DETECTED Final   Streptococcus species NOT DETECTED NOT DETECTED Final   Streptococcus agalactiae NOT DETECTED NOT DETECTED Final   Streptococcus pneumoniae NOT DETECTED NOT DETECTED Final   Streptococcus pyogenes NOT DETECTED NOT DETECTED Final   A.calcoaceticus-baumannii NOT DETECTED NOT DETECTED Final   Bacteroides fragilis NOT DETECTED NOT DETECTED Final   Enterobacterales NOT DETECTED NOT DETECTED Final   Enterobacter cloacae complex NOT DETECTED NOT DETECTED Final   Escherichia coli NOT DETECTED NOT DETECTED Final   Klebsiella aerogenes NOT DETECTED NOT DETECTED Final   Klebsiella oxytoca NOT DETECTED NOT DETECTED Final   Klebsiella pneumoniae NOT DETECTED NOT DETECTED Final   Proteus species NOT DETECTED NOT DETECTED Final   Salmonella species NOT DETECTED NOT DETECTED Final   Serratia marcescens NOT DETECTED NOT DETECTED Final   Haemophilus influenzae NOT DETECTED NOT DETECTED Final   Neisseria meningitidis NOT DETECTED NOT DETECTED Final   Pseudomonas aeruginosa NOT DETECTED NOT DETECTED Final   Stenotrophomonas maltophilia NOT DETECTED NOT DETECTED Final   Candida albicans NOT DETECTED NOT DETECTED Final   Candida auris NOT DETECTED NOT  DETECTED Final   Candida glabrata NOT DETECTED NOT DETECTED Final   Candida krusei NOT DETECTED NOT DETECTED Final   Candida parapsilosis NOT DETECTED NOT DETECTED Final   Candida tropicalis NOT DETECTED NOT DETECTED Final   Cryptococcus neoformans/gattii NOT DETECTED NOT DETECTED Final    Comment: Performed at [facility], [address], [place], [place] [address]  Resp panel by RT-PCR (RSV, Flu A&B, Covid) Anterior Nasal Swab     Status: None   Collection Time: 12/30/23 12:20 PM   Specimen: Anterior Nasal Swab  Result Value Ref Range Status   SARS Coronavirus 2 by RT PCR NEGATIVE NEGATIVE Final    Comment: (NOTE) SARS-CoV-2 target nucleic acids are NOT DETECTED.  The SARS-CoV-2 RNA is generally detectable in upper respiratory specimens during the acute phase of infection. The lowest concentration of SARS-CoV-2 viral copies this assay can detect is 138 copies/mL. A negative result does not preclude SARS-Cov-2 infection and should not be used as the sole basis for treatment or other patient management decisions. A negative result may occur with  improper specimen collection/handling, submission of specimen other than nasopharyngeal swab, presence of viral mutation(s) within the areas targeted by this assay, and inadequate number of viral copies(<138 copies/mL). A negative  result must be combined with clinical observations, patient history, and epidemiological information. The expected result is Negative.  Fact Sheet for Patients:  BloggerCourse.com  Fact Sheet for Healthcare Providers:  SeriousBroker.it  This test is no t yet approved or cleared by the [place]  FDA and  has been authorized for detection and/or diagnosis of SARS-CoV-2 by FDA under an Emergency Use Authorization (EUA). This EUA will remain  in effect (meaning this test can be used) for the duration of the COVID-19 declaration under Section 564(b)(1) of  the Act, 21 U.S.C.section 360bbb-3(b)(1), unless the authorization is terminated  or revoked sooner.       Influenza A by PCR NEGATIVE NEGATIVE Final   Influenza B by PCR NEGATIVE NEGATIVE Final    Comment: (NOTE) The Xpert Xpress SARS-CoV-2/FLU/RSV plus assay is intended as an aid in the diagnosis of influenza from Nasopharyngeal swab specimens and should not be used as a sole basis for treatment. Nasal washings and aspirates are unacceptable for Xpert Xpress SARS-CoV-2/FLU/RSV testing.  Fact Sheet for Patients: BloggerCourse.com  Fact Sheet for Healthcare Providers: SeriousBroker.it  This test is not yet approved or cleared by the [place]  FDA and has been authorized for detection and/or diagnosis of SARS-CoV-2 by FDA under an Emergency Use Authorization (EUA). This EUA will remain in effect (meaning this test can be used) for the duration of the COVID-19 declaration under Section 564(b)(1) of the Act, 21 U.S.C. section 360bbb-3(b)(1), unless the authorization is terminated or revoked.     Resp Syncytial Virus by PCR NEGATIVE NEGATIVE Final    Comment: (NOTE) Fact Sheet for Patients: BloggerCourse.com  Fact Sheet for Healthcare Providers: SeriousBroker.it  This test is not yet approved or cleared by the [place]  FDA and has been authorized for detection and/or diagnosis of SARS-CoV-2 by FDA under an Emergency Use Authorization (EUA). This EUA will remain in effect (meaning this test can be used) for the duration of the COVID-19 declaration under Section 564(b)(1) of the Act, 21 U.S.C. section 360bbb-3(b)(1), unless the authorization is terminated or revoked.  Performed at [facility], [address], [place], [place] [address]   MRSA Next Gen by PCR, Nasal     Status: None   Collection Time: 12/30/23 10:44 PM   Specimen: Nasal Mucosa;  Nasal Swab  Result Value Ref Range Status   MRSA by PCR Next Gen NOT DETECTED NOT DETECTED Final    Comment: (NOTE) The GeneXpert MRSA Assay (FDA approved for NASAL specimens only), is one component of a comprehensive MRSA colonization surveillance program. It is not intended to diagnose MRSA infection nor to guide or monitor treatment for MRSA infections. Test performance is not FDA approved in patients less than 14 years old. Performed at [facility], [address], [place], [place] [address]   Culture, blood (Routine X 2) w Reflex to ID Panel     Status: None (Preliminary result)   Collection Time: 01/02/24  7:48 PM   Specimen: BLOOD RIGHT ARM  Result Value Ref Range Status   Specimen Description   Final    BLOOD RIGHT ARM Performed at [facility], [address], [place], [place] [address]    Special Requests   Final    BOTTLES DRAWN AEROBIC ONLY Blood Culture results may not be optimal due to an inadequate volume of blood received in culture bottles Performed at [facility], [address] [place], [place] [address]    Culture   Final  NO GROWTH 1 DAY Performed at [facility], [address], [place], [place] [address]    Report Status PENDING  Incomplete  Culture, blood (Routine X 2) w Reflex to ID Panel     Status: None (Preliminary result)   Collection Time: 01/02/24  8:55 PM   Specimen: BLOOD LEFT ARM  Result Value Ref Range Status   Specimen Description   Final    BLOOD LEFT ARM Performed at [facility], [address], [place], [place] [address]    Special Requests   Final    BOTTLES DRAWN AEROBIC AND ANAEROBIC Blood Culture results may not be optimal due to an inadequate volume of blood received in culture bottles Performed at [facility], [address], [place], [place] [address]    Culture   Final    NO GROWTH 1 DAY Performed at [facility], [address],  [place], [place] [address]    Report Status PENDING  Incomplete    Labs: CBC: Recent Labs  Lab 12/30/23 0617 12/31/23 0948 01/01/24 0318 01/02/24 0548 01/03/24 0548 01/04/24 0511  WBC 17.7* 16.0* 14.2* 11.9* 12.7* 11.5*  NEUTROABS 13.4*  --   --   --   --   --   HGB 17.2* 15.5 14.8 15.8 15.7 14.9  HCT 52.6* 47.7 44.5 48.4 48.1 44.4  MCV 85.9 85.3 85.7 87.1 86.0 84.9  PLT 321 276 201 189 221 176   Basic Metabolic Panel: Recent Labs  Lab 12/31/23 0948 01/01/24 0318 01/02/24 0548 01/03/24 0548 01/04/24 0511  NA 131* 134* 134* 137 135  K 3.8 3.7 3.3* 3.5 3.1*  CL 94* 101 102 101 102  CO2 24 24 22 24 24   GLUCOSE 139* 121* 81 90 103*  BUN 11 11 14 14 14   CREATININE 0.73 0.71 0.70 0.72 0.82  CALCIUM  9.3 8.8* 9.1 9.2 8.8*   Liver Function Tests: Recent Labs  Lab 12/30/23 0617 01/01/24 0318 01/02/24 0548 01/03/24 0548 01/04/24 0511  AST 15 16 15 15  12*  ALT 14 13 14 15 13   ALKPHOS 85 69 71 71 66  BILITOT 0.7 1.2 0.8 0.5 0.5  PROT 9.6* 7.7 8.0 8.5* 7.8  ALBUMIN 4.1 3.3* 3.5 3.3* 3.2*   CBG: Recent Labs  Lab 01/03/24 2052 01/03/24 2141 01/04/24 0018 01/04/24 0720 01/04/24 1126  GLUCAP 60* 111* 104* 105* 93    Discharge time spent: 35 minutes.  Signed: Elgin Lam, MD [facility] 01/04/2024

## 2024-01-04 NOTE — Anesthesia Preprocedure Evaluation (Signed)
 Anesthesia Evaluation  Patient identified by MRN, date of birth, ID band Patient awake    Reviewed: Allergy & Precautions, NPO status , Patient's Chart, lab work & pertinent test results  History of Anesthesia Complications Negative for: history of anesthetic complications  Airway Mallampati: II  TM Distance: >3 FB Neck ROM: Full    Dental  (+) Upper Dentures   Pulmonary neg sleep apnea, neg COPD, Current Smoker and Patient abstained from smoking.   Pulmonary exam normal breath sounds clear to auscultation       Cardiovascular Exercise Tolerance: Good METS(-) hypertension(-) CAD and (-) Past MI negative cardio ROS (-) dysrhythmias  Rhythm:Regular Rate:Normal - Systolic murmurs    Neuro/Psych negative neurological ROS  negative psych ROS   GI/Hepatic ,GERD  ,,(+)     substance abuse  methamphetamine use, Hepatitis -, C  Endo/Other  neg diabetes    Renal/GU negative Renal ROS     Musculoskeletal   Abdominal   Peds  Hematology   Anesthesia Other Findings Past Medical History: No date: Abscess No date: GERD (gastroesophageal reflux disease) No date: Heroin addiction (HCC) No date: Psoriasis  Reproductive/Obstetrics                              Anesthesia Physical Anesthesia Plan  ASA: 3  Anesthesia Plan: MAC   Post-op Pain Management: Minimal or no pain anticipated   Induction: Intravenous  PONV Risk Score and Plan: 0 and Propofol  infusion, TIVA and Ondansetron   Airway Management Planned: Nasal Cannula  Additional Equipment: None  Intra-op Plan:   Post-operative Plan:   Informed Consent: I have reviewed the patients History and Physical, chart, labs and discussed the procedure including the risks, benefits and alternatives for the proposed anesthesia with the patient or authorized representative who has indicated his/her understanding and acceptance.     Dental  advisory given  Plan Discussed with: CRNA and Surgeon  Anesthesia Plan Comments: (Discussed risks of anesthesia with patient, including possibility of difficulty with spontaneous ventilation under anesthesia necessitating airway intervention, PONV, and rare risks such as cardiac or respiratory or neurological events, and allergic reactions. Discussed the role of CRNA in patient's perioperative care. Patient understands. Patient counseled on benefits of smoking cessation, and increased perioperative risks associated with continued smoking. )        Anesthesia Quick Evaluation

## 2024-01-04 NOTE — Transfer of Care (Signed)
 Immediate Anesthesia Transfer of Care Note  Patient: [name]  Procedure(s) Performed: TRANSESOPHAGEAL ECHOCARDIOGRAM  Patient Location: Cath Lab  Anesthesia Type:MAC  Level of Consciousness: awake, alert , and oriented  Airway & Oxygen Therapy: Patient Spontanous Breathing and Patient connected to nasal cannula oxygen  Post-op Assessment: Report given to RN and Post -op Vital signs reviewed and stable  Post vital signs: Reviewed and stable  Last Vitals:  Vitals Value Taken Time  BP    Temp    Pulse 72 01/04/24 13:04  Resp 12 01/04/24 13:04  SpO2 97 % 01/04/24 13:04  Vitals shown include unfiled device data.  Last Pain:  Vitals:   01/04/24 1201  TempSrc: Temporal  PainSc:       Patients Stated Pain Goal: 0 (01/01/24 1059)  Complications: No notable events documented.

## 2024-01-04 NOTE — Discharge Instructions (Addendum)
 [name],  You were here with confusion but were also found to have a blood infection caused by a bacteria. You have been started on antibiotics for treatment. Please follow-up with your PCP and the infectious disease physician.

## 2024-01-04 NOTE — Op Note (Signed)
 INDICATIONS: infective endocarditis  PROCEDURE:   Informed consent was obtained prior to the procedure. The risks, benefits and alternatives for the procedure were discussed and the patient comprehended these risks.  Risks include, but are not limited to, cough, sore throat, vomiting, nausea, somnolence, esophageal and stomach trauma or perforation, bleeding, low blood pressure, aspiration, pneumonia, infection, trauma to the teeth and death.    During this procedure the patient was administered IV propofol  byu Anesthesiology, Dr. Boone.  The transesophageal probe was inserted in the esophagus and stomach without difficulty and multiple views were obtained.  The patient was kept under observation until the patient left the procedure room.  The patient left the procedure room in stable condition.   Agitated microbubble saline contrast was not administered.  COMPLICATIONS:    There were no immediate complications.  FINDINGS:  Normal TEE. No evidence of endocarditis  RECOMMENDATIONS:     Evaluate for alternative cause of bacteremia.  Time Spent Directly with the Patient:  45 minutes   [name] 01/04/2024, 1:02 PM

## 2024-01-04 NOTE — Anesthesia Postprocedure Evaluation (Signed)
 Anesthesia Post Note  Patient: [name]  Procedure(s) Performed: TRANSESOPHAGEAL ECHOCARDIOGRAM     Patient location during evaluation: Cath Lab Anesthesia Type: MAC Level of consciousness: awake and alert Pain management: pain level controlled Vital Signs Assessment: post-procedure vital signs reviewed and stable Respiratory status: spontaneous breathing, nonlabored ventilation, respiratory function stable and patient connected to nasal cannula oxygen Cardiovascular status: stable and blood pressure returned to baseline Postop Assessment: no apparent nausea or vomiting Anesthetic complications: no   No notable events documented.  Last Vitals:  Vitals:   01/04/24 1230 01/04/24 1305  BP: 112/69 99/61  Pulse: 76 72  Resp: 15 12  Temp:    SpO2: 96% 97%    Last Pain:  Vitals:   01/04/24 1201  TempSrc: Temporal  PainSc:                  Rome Ade

## 2024-01-05 ENCOUNTER — Encounter (HOSPITAL_COMMUNITY): Payer: Self-pay | Admitting: Cardiovascular Disease

## 2024-01-05 LAB — HEPATITIS B SURFACE ANTIBODY, QUANTITATIVE: Hep B S AB Quant (Post): 718 m[IU]/mL

## 2024-01-05 LAB — HCV RNA DIAGNOSIS, NAA: HCV RNA, Quantitation: NOT DETECTED [IU]/mL

## 2024-01-07 ENCOUNTER — Other Ambulatory Visit: Payer: Self-pay

## 2024-01-07 ENCOUNTER — Telehealth: Payer: Self-pay

## 2024-01-07 ENCOUNTER — Telehealth: Payer: Self-pay | Admitting: *Deleted

## 2024-01-07 NOTE — Transitions of Care (Post Inpatient/ED Visit) (Signed)
   01/07/2024  Name: [name] MRN: [id number] DOB: 01-12-1981  Today's TOC FU Call Status: Today's TOC FU Call Status:: Unsuccessful Call (1st Attempt) Unsuccessful Call (1st Attempt) Date: 01/07/24  Attempted to reach the patient regarding the most recent Inpatient/ED visit.  Follow Up Plan: Additional outreach attempts will be made to reach the patient to complete the Transitions of Care (Post Inpatient/ED visit) call.   Andrea Dimes RN, BSN [place]  [facility] RN Care Manager [phone]

## 2024-01-07 NOTE — Transitions of Care (Post Inpatient/ED Visit) (Unsigned)
 01/07/2024  Name: [name] MRN: [id number] DOB: 1981-05-23  Today's TOC FU Call Status: Today's TOC FU Call Status:: Successful TOC FU Call Completed TOC FU Call Complete Date: 01/07/24 Patient's Name and Date of Birth confirmed.  Transition Care Management Follow-up Telephone Call Date of Discharge: 01/04/24 Discharge Facility: [name]) Type of Discharge: Inpatient Admission Primary Inpatient Discharge Diagnosis:: bacteremia How have you been since you were released from the hospital?: Better (He said his mind is clear now. Yesterday he explained that he had about a 15 minute episode of  psychosis but it passed without any issues and now he is  fine.) Any questions or concerns?: Yes Patient Questions/Concerns:: He said he would like a referral to a psychiatrist.  He said that he has experienced episodes of  psychosis  in the past and was on medication.  He described these psychotic events as  family screaming in my head.  He denied any suicidal/ homicidal thoughts. Patient Questions/Concerns Addressed: Notified Provider of Patient Questions/Concerns (patient plans to go to [name].  William Mayers, [facility] notified of his request.)  Items Reviewed: Did you receive and understand the discharge instructions provided?: Yes Medications obtained,verified, and reconciled?: Yes (Medications Reviewed) (He is missing multiple meds, he said the [name] been [facility] them. I explained that it is important to estab. care with a PCP and he said he understood. I told him about the [name],tomorrow they are at [name] and he said he can get there.) Any new allergies since your discharge?: No Dietary orders reviewed?: Yes Type of Diet Ordered:: heart healthy,low sodium Do you have support at home?: Yes People in Home [RPT]: parent(s) Name of Support/Comfort Primary Source: he lives with his father.  Medications Reviewed Today: Medications Reviewed Today     Reviewed by  William Bradley, RN (Case Manager) on 01/07/24 at 1237  Med List Status: <None>   Medication Order Taking? Sig Documenting Provider Last Dose Status Informant  Cholecalciferol  (VITAMIN D3) 50 MCG (2000 UT) capsule [id number]  TAKE ONE CAPSULE EACH DAY [provider]  Active   glipiZIDE  (GLUCOTROL ) 10 MG tablet [id number]  Take 1 tablet (10 mg total) by mouth 2 (two) times daily before a meal. William Rosaline SQUIBB, NP  Expired 12/30/23 2359   hydrochlorothiazide (HYDRODIURIL) 25 MG tablet [id number]  Take 25 mg by mouth daily.  Patient not taking: Reported on 01/07/2024   [provider]  Active   linezolid  (ZYVOX ) 600 MG tablet [id number]  Take 1 tablet (600 mg total) by mouth 2 (two) times daily for 12 days.  Patient not taking: Reported on 01/07/2024   [name], William SAILOR, MD  Active   LIPITOR 20 MG tablet [id number]  Take 20 mg by mouth at bedtime. [provider]  Active   lisinopril  (ZESTRIL ) 5 MG tablet [id number]  Take 1 tablet (5 mg total) by mouth daily.  Patient not taking: Reported on 01/07/2024   William Rosaline SQUIBB, NP  Active            Med Note DELETA, CARLEY Mays   Tue Jan 01, 2024 12:20 PM) No recent fills  metFORMIN  (GLUCOPHAGE ) 1000 MG tablet [id number]  Take 1,000 mg by mouth 2 (two) times daily with a meal. [provider]  Active Father  methadone  (DOLOPHINE ) 1 mg/ml oral solution [id number]  Take 57 mg by mouth daily. [provider]  Active            Med  Note [name] Pablo Dec 31, 2023  7:29 AM) Verified dose w/ [name] 07/28. Last dose 7/26 @57mg   PROTONIX  20 MG tablet [id number]  Take 20 mg by mouth daily.  Patient not taking: Reported on 01/07/2024   [provider]  Active Father  QUEtiapine  (SEROQUEL ) 300 MG tablet [id number]  Take 300 mg by mouth at bedtime. [provider]  Active   triamcinolone  (KENALOG ) 0.1 % [id number]   [provider]  Active   VENTOLIN HFA 108 (90 Base) MCG/ACT inhaler  [id number]  Inhale 2 puffs into the lungs every 4 (four) hours as needed.  Patient not taking: Reported on 01/07/2024   [provider]  Active   VISTARIL  50 MG capsule [id number]  Take 50 mg by mouth 3 (three) times daily as needed.  Patient not taking: Reported on 01/07/2024   [provider]  Active   Med List Note [name], CPhT 12/31/23 9271): [facility] ([name]) [phone]            Home Care and Equipment/Supplies: Were Home Health Services Ordered?: No Any new equipment or medical supplies ordered?: No  Functional Questionnaire: Do you need assistance with bathing/showering or dressing?: No Do you need assistance with meal preparation?: No Do you need assistance with eating?: No Do you have difficulty maintaining continence: No Do you need assistance with getting out of bed/getting out of a chair/moving?: No Do you have difficulty managing or taking your medications?: No  Follow up appointments reviewed: PCP Follow-up appointment confirmed?: Yes Date of PCP follow-up appointment?: 01/17/24 Follow-up Provider: Dr Tanda.  Rosaline Bohr, NP is listed as PCP but he has not seen her since 11/2020 and he said he preferred a PCP at [facility] because it is closer to his home Specialist Hospital Follow-up appointment confirmed?: Yes Date of Specialist follow-up appointment?: 02/15/24 Follow-Up Specialty Provider:: [facility] Do you need transportation to your follow-up appointment?: No Do you understand care options if your condition(s) worsen?: Yes-patient verbalized understanding  He did not see the instructions to hold glipizide  so he took it today but said he will hold off until a provider tells him to resume.     SIGNATURE William Diesel, RN

## 2024-01-08 LAB — CULTURE, BLOOD (ROUTINE X 2)
Culture: NO GROWTH
Culture: NO GROWTH

## 2024-01-09 ENCOUNTER — Telehealth: Payer: Self-pay | Admitting: *Deleted

## 2024-01-09 NOTE — Transitions of Care (Post Inpatient/ED Visit) (Signed)
   01/09/2024  Name: YOMAR MEJORADO MRN: [id number] DOB: 06/27/1980  Today's TOC FU Call Status: Today's TOC FU Call Status:: Unsuccessful Call (2nd Attempt) Unsuccessful Call (2nd Attempt) Date: 01/09/24  Attempted to reach the patient regarding the most recent Inpatient/ED visit.  Follow Up Plan: Additional outreach attempts will be made to reach the patient to complete the Transitions of Care (Post Inpatient/ED visit) call.   Andrea Dimes RN, BSN [place]  [facility] RN Care Manager [phone]

## 2024-01-10 ENCOUNTER — Telehealth (INDEPENDENT_AMBULATORY_CARE_PROVIDER_SITE_OTHER): Payer: Self-pay | Admitting: Primary Care

## 2024-01-10 ENCOUNTER — Telehealth: Payer: Self-pay | Admitting: *Deleted

## 2024-01-10 DIAGNOSIS — F489 Nonpsychotic mental disorder, unspecified: Secondary | ICD-10-CM

## 2024-01-10 NOTE — Transitions of Care (Post Inpatient/ED Visit) (Signed)
 01/10/2024  Name: [name] MRN: [id number] DOB: 01-Mar-1981  Today's TOC FU Call Status: Today's TOC FU Call Status:: Successful TOC FU Call Completed TOC FU Call Complete Date: 01/10/24 Patient's Name and Date of Birth confirmed.  Transition Care Management Follow-up Telephone Call Date of Discharge: 01/04/24 Discharge Facility: [facility]) Type of Discharge: Inpatient Admission Primary Inpatient Discharge Diagnosis:: Bacteremia How have you been since you were released from the hospital?: Better Any questions or concerns?: Yes Patient Questions/Concerns:: Patient would like a referral to Psychiatry for psychosis and schizophrenia Patient Questions/Concerns Addressed: Other: (RNCM will place referral to [facility] LCSW)  Items Reviewed: Did you receive and understand the discharge instructions provided?: Yes Medications obtained,verified, and reconciled?: Yes (Medications Reviewed) Any new allergies since your discharge?: No Dietary orders reviewed?: Yes Type of Diet Ordered:: Heart Health, low sodium Do you have support at home?: Yes People in Home [RPT]: parent(s) Name of Support/Comfort Primary Source: lives with Father/Joe  Medications Reviewed Today: Medications Reviewed Today     Reviewed by Lucky Andrea LABOR, RN (Registered Nurse) on 01/10/24 at 1225  Med List Status: <None>   Medication Order Taking? Sig Documenting Provider Last Dose Status Informant  Cholecalciferol  (VITAMIN D3) 50 MCG (2000 UT) capsule [id number]  TAKE ONE CAPSULE EACH DAY  Patient not taking: Reported on 01/10/2024   [provider]  Active   glipiZIDE  (GLUCOTROL ) 10 MG tablet [id number]  Take 1 tablet (10 mg total) by mouth 2 (two) times daily before a meal.  Patient not taking: Reported on 01/10/2024   Celestia Rosaline SQUIBB, NP  Expired 12/30/23 2359   hydrochlorothiazide (HYDRODIURIL) 25 MG tablet [id number] Yes Take 25 mg by mouth daily. [provider]  Active   linezolid  (ZYVOX )  600 MG tablet [id number] Yes Take 1 tablet (600 mg total) by mouth 2 (two) times daily for 12 days. Fleeta Rothman, Jomarie SAILOR, MD  Active   LIPITOR 20 MG tablet [id number] Yes Take 20 mg by mouth at bedtime. [provider]  Active   lisinopril  (ZESTRIL ) 5 MG tablet [id number]  Take 1 tablet (5 mg total) by mouth daily.  Patient not taking: Reported on 01/10/2024   Celestia Rosaline SQUIBB, NP  Active            Med Note DELETA, KENTRELL HALLAHAN   Tue Jan 01, 2024 12:20 PM) No recent fills  metFORMIN  (GLUCOPHAGE ) 1000 MG tablet [id number] Yes Take 1,000 mg by mouth 2 (two) times daily with a meal. [provider]  Active Father  methadone  (DOLOPHINE ) 1 mg/ml oral solution [id number] Yes Take 50 mg by mouth daily. [provider]  Active            Med Note (ROBINSON, TAYLOR H   Mon Dec 31, 2023  7:29 AM) Verified dose w/ [facility] 07/28. Last dose 7/26 @57mg   PROTONIX  20 MG tablet [id number] Yes Take 20 mg by mouth daily. [provider]  Active Father  QUEtiapine  (SEROQUEL ) 300 MG tablet [id number] Yes Take 300 mg by mouth at bedtime.  Patient taking differently: Take 400 mg by mouth at bedtime.   [provider]  Active   triamcinolone  (KENALOG ) 0.1 % [id number] Yes  [provider]  Active   VENTOLIN HFA 108 (90 Base) MCG/ACT inhaler [id number] Yes Inhale 2 puffs into the lungs every 4 (four) hours as needed. [provider]  Active   VISTARIL  50 MG capsule [id number] Yes Take 50 mg by mouth 3 (three) times daily  as needed. [provider]  Active   Med List Note Jackolyn Waddell DEL, CPhT 12/31/23 9271): [facility] [phone]            Home Care and Equipment/Supplies: Were Home Health Services Ordered?: No Any new equipment or medical supplies ordered?: No  Functional Questionnaire: Do you need assistance with bathing/showering or dressing?: No Do you need assistance with meal preparation?: No Do you need  assistance with eating?: No Do you have difficulty maintaining continence: No Do you need assistance with getting out of bed/getting out of a chair/moving?: No Do you have difficulty managing or taking your medications?: No  Follow up appointments reviewed: PCP Follow-up appointment confirmed?: Yes Date of PCP follow-up appointment?: 01/17/24 Follow-up Provider: Dr. Tanda [facility] Follow-up appointment confirmed?: Yes Date of Specialist follow-up appointment?: 02/15/24 Follow-Up Specialty Provider:: [facility] Do you need transportation to your follow-up appointment?: No Do you understand care options if your condition(s) worsen?: Yes-patient verbalized understanding (Specifically discussed 24 hour Behavior Health Urgent Care [phone])  SDOH Interventions Today    Flowsheet Row Most Recent Value  SDOH Interventions   Food Insecurity Interventions Intervention Not Indicated  Housing Interventions Intervention Not Indicated  Transportation Interventions Intervention Not Indicated  Utilities Interventions Intervention Not Indicated    Andrea Dimes RN, BSN [place]  [facility] RN Care Manager [phone]

## 2024-01-10 NOTE — Telephone Encounter (Signed)
 Dr Tanda-   RICK.  This [facility] not [facility] to [facility] to see [facility]ari as he discussed.  I had given Cari a heads up on his needs. He has an appointment with you 8/14 and will probably want to establish care with you because [place] is closer to where he lives. I hope he comes to the appt.     Hi Cari- this patient is missing multiple meds and has not seen a PCP since 11/2020. He said that his provider at [facility] was filling them for him. I scheduled him with Dr Tanda 01/17/2024 but he really needs his meds reconciled before then. He said he has transportation to the [facility] tomorrow at [employer].  He would also like a referral to psychiatry.

## 2024-01-10 NOTE — Telephone Encounter (Unsigned)
 Copied from CRM [id number]. Topic: Appointments - Scheduling Inquiry for Clinic >> Jan 10, 2024  1:47 PM Tiffini S wrote: Reason for CRM: Patient was just released from the ED and needs a hospital follow up in one week. Scheduled for next available on October 1 but needs a sooner appointment. Please call the patient at [phone].

## 2024-01-11 ENCOUNTER — Telehealth: Payer: Self-pay | Admitting: *Deleted

## 2024-01-11 NOTE — Progress Notes (Signed)
 Complex Care Management Note Care Guide Note  01/11/2024 Name: [name] MRN: [id number] DOB: [birth year]   Complex Care Management Outreach Attempts: An unsuccessful telephone outreach was attempted today to offer the patient information about available complex care management services.  Follow Up Plan:  Additional outreach attempts will be made to offer the patient complex care management information and services.   Encounter Outcome:  No Answer  Thedford Franks, CMA   [facility], [facility] Guide Direct Dial: [phone]  Fax: [phone] Website: [email or web address]

## 2024-01-15 NOTE — Progress Notes (Signed)
 Complex Care Management Note Care Guide Note  01/15/2024 Name: [name] MRN: [id number] DOB: Aug 12, 1980   Complex Care Management Outreach Attempts: A second unsuccessful outreach was attempted today to offer the patient with information about available complex care management services.  Follow Up Plan:  Additional outreach attempts will be made to offer the patient complex care management information and services.   Encounter Outcome:  No Answer  Thedford Franks, CMA [place]  [facility], [facility] Guide Direct Dial: [phone]  Fax: [phone] Website: [email or web address]

## 2024-01-16 NOTE — Progress Notes (Signed)
 Complex Care Management Note Care Guide Note  01/16/2024 Name: [name] MRN: [id number] DOB: 1980/07/08   Complex Care Management Outreach Attempts: A third unsuccessful outreach was attempted today to offer the patient with information about available complex care management services.  Follow Up Plan:  No further outreach attempts will be made at this time. We have been unable to contact the patient to offer or enroll patient in complex care management services.  Encounter Outcome:  No Answer  Thedford Franks, CMA   [facility], [facility] Guide Direct Dial: [phone]  Fax: [phone] Website: [email or web address]

## 2024-01-17 ENCOUNTER — Ambulatory Visit (INDEPENDENT_AMBULATORY_CARE_PROVIDER_SITE_OTHER): Payer: MEDICAID | Admitting: Family Medicine

## 2024-01-17 ENCOUNTER — Encounter: Payer: Self-pay | Admitting: Family Medicine

## 2024-01-17 VITALS — BP 139/89 | HR 105 | Ht 71.0 in | Wt 217.2 lb

## 2024-01-17 DIAGNOSIS — F1920 Other psychoactive substance dependence, uncomplicated: Secondary | ICD-10-CM

## 2024-01-17 DIAGNOSIS — Z87898 Personal history of other specified conditions: Secondary | ICD-10-CM

## 2024-01-17 DIAGNOSIS — Z09 Encounter for follow-up examination after completed treatment for conditions other than malignant neoplasm: Secondary | ICD-10-CM | POA: Diagnosis not present

## 2024-01-21 ENCOUNTER — Encounter: Payer: Self-pay | Admitting: Family Medicine

## 2024-01-21 NOTE — Progress Notes (Signed)
 Established Patient Office Visit  Subjective    Patient ID: KYMIR COLES, male    DOB: 10/15/1980  Age: 43 y.o. MRN: [id number]  CC:  Chief Complaint  Patient presents with   Hospitalization Follow-up    HPI [name] presents for hospital discharge follow up where he was admitted for bacteremia and polysubstance abuse. Patient reports improvement since discharge.   Outpatient Encounter Medications as of 01/17/2024  Medication Sig   hydrochlorothiazide (HYDRODIURIL) 25 MG tablet Take 25 mg by mouth daily.   LIPITOR 20 MG tablet Take 20 mg by mouth at bedtime.   metFORMIN  (GLUCOPHAGE ) 1000 MG tablet Take 1,000 mg by mouth 2 (two) times daily with a meal.   methadone  (DOLOPHINE ) 1 mg/ml oral solution Take 50 mg by mouth daily.   PROTONIX  20 MG tablet Take 20 mg by mouth daily.   QUEtiapine  (SEROQUEL ) 300 MG tablet Take 300 mg by mouth at bedtime.   VENTOLIN HFA 108 (90 Base) MCG/ACT inhaler Inhale 2 puffs into the lungs every 4 (four) hours as needed.   VISTARIL  50 MG capsule Take 50 mg by mouth 3 (three) times daily as needed.   Cholecalciferol  (VITAMIN D3) 50 MCG (2000 UT) capsule TAKE ONE CAPSULE EACH DAY (Patient not taking: Reported on 01/10/2024)   [Paused] glipiZIDE  (GLUCOTROL ) 10 MG tablet Take 1 tablet (10 mg total) by mouth 2 (two) times daily before a meal. (Patient not taking: Reported on 01/10/2024)   lisinopril  (ZESTRIL ) 5 MG tablet Take 1 tablet (5 mg total) by mouth daily. (Patient not taking: Reported on 01/10/2024)   triamcinolone  (KENALOG ) 0.1 %    No facility-administered encounter medications on file as of 01/17/2024.    Past Medical History:  Diagnosis Date   Abscess    GERD (gastroesophageal reflux disease)    Heroin addiction (HCC)    Psoriasis     Past Surgical History:  Procedure Laterality Date   I & D EXTREMITY Left 03/25/2017   Procedure: IRRIGATION AND DEBRIDEMENT LEFT ARM ABSCESS;  Surgeon: Vernetta Lonni GRADE, MD;  Location: [facility] OR;   Service: Orthopedics;  Laterality: Left;   TRANSESOPHAGEAL ECHOCARDIOGRAM (CATH LAB) N/A 01/04/2024   Procedure: TRANSESOPHAGEAL ECHOCARDIOGRAM;  Surgeon: Francyne Headland, MD;  Location: [facility] INVASIVE CV LAB;  Service: Cardiovascular;  Laterality: N/A;    Family History  Problem Relation Age of Onset   Anxiety disorder Mother    Mental illness Neg Hx    Hypertension Neg Hx     Social History   Socioeconomic History   Marital status: Single    Spouse name: Not on file   Number of children: Not on file   Years of education: Not on file   Highest education level: Not on file  Occupational History   Not on file  Tobacco Use   Smoking status: Every Day    Current packs/day: 1.00    Types: Cigarettes    Passive exposure: Past   Smokeless tobacco: Never   Tobacco comments:    1 pack every day and a half  Vaping Use   Vaping status: Never Used  Substance and Sexual Activity   Alcohol use: No    Comment: 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   Drug use: Not Currently    Types: Other-see comments, Cocaine, Marijuana, Heroin    Comment: clean x 1 year as of 2024 on Methadone    Sexual activity: Never  Other Topics Concern   Not on file  Social History Narrative  Not on file   Social Drivers of Health   Financial Resource Strain: Not on file  Food Insecurity: No Food Insecurity (01/10/2024)   Hunger Vital Sign    Worried About Running Out of Food in the Last Year: Never true    Ran Out of Food in the Last Year: Never true  Transportation Needs: No Transportation Needs (01/10/2024)   PRAPARE - Administrator, Civil Service (Medical): No    Lack of Transportation (Non-Medical): No  Physical Activity: Not on file  Stress: Not on file  Social Connections: Not on file  Intimate Partner Violence: Not At Risk (01/10/2024)   Humiliation, Afraid, Rape, and Kick questionnaire    Fear of Current or Ex-Partner: No    Emotionally Abused: No    Physically Abused: No    Sexually  Abused: No    Review of Systems  All other systems reviewed and are negative.       Objective    BP 139/89   Pulse (!) 105   Ht 5' 11 (1.803 m)   Wt 217 lb 3.2 oz (98.5 kg)   SpO2 90%   BMI 30.29 kg/m   Physical Exam Vitals and nursing note reviewed.  Constitutional:      General: He is not in acute distress. Cardiovascular:     Rate and Rhythm: Normal rate and regular rhythm.  Pulmonary:     Effort: Pulmonary effort is normal.     Breath sounds: Normal breath sounds.  Abdominal:     Palpations: Abdomen is soft.     Tenderness: There is no abdominal tenderness.  Neurological:     General: No focal deficit present.     Mental Status: He is alert and oriented to person, place, and time.         Assessment & Plan:   Hospital discharge follow-up  History of bacteremia  Polysubstance dependence on agonist therapy (HCC)   Improving. Continue  No follow-ups on file.   Tanda Raguel SQUIBB, MD

## 2024-02-15 ENCOUNTER — Encounter: Payer: Self-pay | Admitting: Family

## 2024-02-15 ENCOUNTER — Ambulatory Visit (INDEPENDENT_AMBULATORY_CARE_PROVIDER_SITE_OTHER): Payer: MEDICAID | Admitting: Family

## 2024-02-15 ENCOUNTER — Other Ambulatory Visit: Payer: Self-pay

## 2024-02-15 VITALS — BP 107/75 | HR 101 | Temp 97.8°F | Ht 71.0 in | Wt 216.0 lb

## 2024-02-15 DIAGNOSIS — B182 Chronic viral hepatitis C: Secondary | ICD-10-CM

## 2024-02-15 NOTE — Patient Instructions (Signed)
 Nice to see you.  We will check your lab work today.  No additional medication needed at this time.   Recommend routine liver cancer screening every 6 months with ultrasound with or without lab work (alpha fetoprotein).   If you need to be tested for Hepatitis C in the future a Hepatitis C RNA level or viral load will need to be checked as the Hepatitis C antibody used for initial screening will always be positive.    Have a great day and stay safe!

## 2024-02-15 NOTE — Progress Notes (Signed)
 Subjective:   Patient ID: [name], male    DOB: [birth year], 43 y.o.   MRN: [id number]  Chief Complaint  Patient presents with   Follow-up    Hepatitis C    HPI:  [name] is a 43 y.o. male with chronic Hepatitis C last seen on 09/06/23 by Charlott Flowers, PharmD-CPP at one month into 12 week treatment with Epclusa . Hepatitis C RNA was undetectable at the time. Continued on Epclusa . Here today for follow up.   [name] has been doing well since his last office visit and competed treatment as planned at the end of May without complication. No current sympotms denies abdominal pain, nausea, vomiting, fatigue, fever, scleral icterus or jaundice. Recently hospitalized and found to have bacteremia. Completed course of antibiotics a couple of weeks ago. Working on decreasing methadone  and awaiting dermatology appointment.   No Known Allergies    Outpatient Medications Prior to Visit  Medication Sig Dispense Refill   hydrochlorothiazide (HYDRODIURIL) 25 MG tablet Take 25 mg by mouth daily.     LIPITOR 20 MG tablet Take 20 mg by mouth at bedtime.     lisinopril  (ZESTRIL ) 5 MG tablet Take 1 tablet (5 mg total) by mouth daily. 30 tablet 1   metFORMIN  (GLUCOPHAGE ) 1000 MG tablet Take 1,000 mg by mouth 2 (two) times daily with a meal.     methadone  (DOLOPHINE ) 1 mg/ml oral solution Take 50 mg by mouth daily.     PROTONIX  20 MG tablet Take 20 mg by mouth daily.     QUEtiapine  (SEROQUEL ) 300 MG tablet Take 300 mg by mouth at bedtime.     triamcinolone  (KENALOG ) 0.1 %      VENTOLIN HFA 108 (90 Base) MCG/ACT inhaler Inhale 2 puffs into the lungs every 4 (four) hours as needed.     VISTARIL  50 MG capsule Take 50 mg by mouth 3 (three) times daily as needed.     Cholecalciferol  (VITAMIN D3) 50 MCG (2000 UT) capsule TAKE ONE CAPSULE EACH DAY (Patient not taking: Reported on 02/15/2024)     glipiZIDE  (GLUCOTROL ) 10 MG tablet Take 1 tablet (10 mg total) by mouth 2 (two) times daily before  a meal. (Patient not taking: Reported on 01/10/2024) 180 tablet 2   No facility-administered medications prior to visit.     Past Medical History:  Diagnosis Date   Abscess    GERD (gastroesophageal reflux disease)    Heroin addiction (HCC)    Psoriasis      Past Surgical History:  Procedure Laterality Date   I & D EXTREMITY Left 03/25/2017   Procedure: IRRIGATION AND DEBRIDEMENT LEFT ARM ABSCESS;  Surgeon: Vernetta Lonni GRADE, MD;  Location: [facility] OR;  Service: Orthopedics;  Laterality: Left;   TRANSESOPHAGEAL ECHOCARDIOGRAM (CATH LAB) N/A 01/04/2024   Procedure: TRANSESOPHAGEAL ECHOCARDIOGRAM;  Surgeon: Francyne Headland, MD;  Location: [facility];  Service: Cardiovascular;  Laterality: N/A;    Review of Systems  Constitutional:  Negative for chills, diaphoresis, fatigue and fever.  Respiratory:  Negative for cough, chest tightness, shortness of breath and wheezing.   Cardiovascular:  Negative for chest pain.  Gastrointestinal:  Negative for abdominal distention, abdominal pain, constipation, diarrhea, nausea and vomiting.  Neurological:  Negative for weakness and headaches.  Hematological:  Does not bruise/bleed easily.    Objective:   BP 107/75   Pulse (!) 101   Temp 97.8 F (36.6 C) (Temporal)   Ht 5' 11 (1.803 m)   Wt 216  lb (98 kg)   SpO2 94%   BMI 30.13 kg/m  Nursing note and vital signs reviewed.  Physical Exam Constitutional:      General: He is not in acute distress.    Appearance: He is well-developed.  Cardiovascular:     Rate and Rhythm: Normal rate and regular rhythm.     Heart sounds: Normal heart sounds. No murmur heard.    No friction rub. No gallop.  Pulmonary:     Effort: Pulmonary effort is normal. No respiratory distress.     Breath sounds: Normal breath sounds. No wheezing or rales.  Chest:     Chest wall: No tenderness.  Abdominal:     General: Bowel sounds are normal. There is no distension.     Palpations: Abdomen is soft.  There is no mass.     Tenderness: There is no abdominal tenderness. There is no guarding or rebound.  Skin:    General: Skin is warm and dry.  Neurological:     Mental Status: He is alert and oriented to person, place, and time.  Psychiatric:        Behavior: Behavior normal.        Thought Content: Thought content normal.        Judgment: Judgment normal.         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   Acute encephalopathy 12/30/2023   Chronic hepatitis C with hepatic coma (HCC) 08/16/2022   Cellulitis of left arm 03/25/2017   Bacteremia 03/25/2017   IV drug abuse (HCC) 03/25/2017   Hyponatremia 03/25/2017   Abscess of arm, left    Hyperprolactinemia (HCC) 03/19/2015   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 No current symptoms. Did have elevated fibrosis score with recent CT showing a hypodense lesion. Given this discussed recommendation for continued hepatocellular carcinoma screening with ultrasound with or without alpha-fetoprotein every 6 months. If lab work  is negative today no additional treatment needed. If needing to be retested for Hepatitis C in the future will need Hepatitis C RNA as Hepatitis C antibody will always remain positive. Follow up with ID as needed pending lab work results for sustained viremic response.       Relevant Orders   Hepatitis C RNA quantitative     I am having [name] maintain his QUEtiapine , Lipitor, Vitamin D3, Vistaril , Protonix , triamcinolone  cream, lisinopril , [Paused] glipiZIDE , methadone , metFORMIN , Ventolin HFA, and hydrochlorothiazide.  Follow-up: As needed pending lab work results.    Cathlyn July, MSN, FNP-C Nurse Practitioner [facility] for Infectious Disease [facility] Main number: [phone]

## 2024-02-15 NOTE — Assessment & Plan Note (Signed)
 Mr. [name] has completed 12 weeks of Epclusa  without adverse side effects and discussed plan of care to check lab work today for sustained viremic response. No current symptoms. Did have elevated fibrosis score with recent CT showing a hypodense lesion. Given this discussed recommendation for continued hepatocellular carcinoma screening with ultrasound with or without alpha-fetoprotein every 6 months. If lab work is negative today no additional treatment needed. If needing to be retested for Hepatitis C in the future will need Hepatitis C RNA as Hepatitis C antibody will always remain positive. Follow up with ID as needed pending lab work results for sustained viremic response.

## 2024-02-18 LAB — HEPATITIS C RNA QUANTITATIVE
HCV Quantitative Log: <1.18 {Log_IU}/mL
HCV RNA, PCR, QN: <15 [IU]/mL

## 2024-03-03 ENCOUNTER — Telehealth (INDEPENDENT_AMBULATORY_CARE_PROVIDER_SITE_OTHER): Payer: Self-pay | Admitting: Primary Care

## 2024-03-03 NOTE — Telephone Encounter (Signed)
 Called pt to reschedule appt. Pt did not answer and LVM for pt to return call so that we could place pt on schedule.

## 2024-03-05 ENCOUNTER — Ambulatory Visit (INDEPENDENT_AMBULATORY_CARE_PROVIDER_SITE_OTHER): Payer: MEDICAID | Admitting: Primary Care

## 2024-03-18 ENCOUNTER — Encounter: Payer: Self-pay | Admitting: Physician Assistant

## 2024-03-18 ENCOUNTER — Ambulatory Visit: Payer: MEDICAID | Admitting: Physician Assistant

## 2024-03-18 VITALS — BP 126/87 | HR 103

## 2024-03-18 DIAGNOSIS — L409 Psoriasis, unspecified: Secondary | ICD-10-CM

## 2024-03-18 MED ORDER — CALCIPOTRIENE 0.005 % EX CREA: TOPICAL_CREAM | Freq: Two times a day (BID) | CUTANEOUS | 0 refills | Status: AC

## 2024-03-18 MED ORDER — CLOBETASOL PROPIONATE 0.05 % EX CREA: 1.0000 | TOPICAL_CREAM | Freq: Two times a day (BID) | CUTANEOUS | 2 refills | Status: AC

## 2024-03-18 NOTE — Progress Notes (Signed)
   New Patient Visit   Subjective  [name] is a 43 y.o. male NEW PATIENT who presents for the following: psoriasis   Patient states he has psoriasis located at the Arms,legs,chest that he would like to have examined. Patient reports the areas have been there for 10 years. Uses triamcinolone  cream which helps only a little. Denies any joint pains. Has seen dermatology many years ago but due to his hepatitis C - they were not able to put him on any immunosuppressing agents.   He has recently been treated for his hepatitis C and currently see Infectious disease.   Patient states when he takes less methadone  he has less flares.     The following portions of the chart were reviewed this encounter and updated as appropriate: medications, allergies, medical history  Review of Systems:  No other skin or systemic complaints except as noted in HPI or Assessment and Plan.  Objective  Well appearing patient in no apparent distress; mood and affect are within normal limits.  A full examination was performed including scalp, head, eyes, ears, nose, lips, neck, chest, axillae, abdomen, back, buttocks, bilateral upper extremities, bilateral lower extremities, hands, feet, fingers, toes, fingernails, and toenails. All findings within normal limits unless otherwise noted below.   A focused examination was performed of the following areas: LEGS,ARMS,ABDOMEN,BACK  Relevant exam findings are noted in the Assessment and Plan.            Assessment & Plan   PSORIASIS - ARMS, LEGS, TRUNK  Exam: Well-demarcated erythematous papules/plaques with silvery scale, guttate pink scaly papules. 35% BSA.  patient denies joint pain  Psoriasis is a chronic non-curable, but treatable genetic/hereditary disease that may have other systemic features affecting other organ systems such as joints (Psoriatic Arthritis). It is associated with an increased risk of inflammatory bowel disease, heart disease,  non-alcoholic fatty liver disease, and depression.  Treatments include light and laser treatments; topical medications; and systemic medications including oral and injectables.  Treatment Plan: - lengthy conversation regarding treatment options  - plan to repeat labs and correspond with Infectious disease as to get their approval of proceeding with Otezla (caution if any liver disease). I hesitate still doing any immunosuppressing agent due to him just recently being treated for hepatitis C.  - Sotyktu is also an option if we decide to not go with [place]     PSORIASIS   Related Procedures CMP CBC with Differential/Platelets QuantiFERON-TB Gold Plus Acute Hep Panel & Hep B Surface Ab Hep C Antibody Related Medications clobetasol cream (TEMOVATE) 0.05 % Apply 1 Application topically 2 (two) times daily. APPLY TWICE DAILY AS NEEDED calcipotriene (DOVONOX) 0.005 % cream Apply topically 2 (two) times daily.  Return in about 3 months (around 06/18/2024) for Psoriasis FOLLOW UP.  I, Doyce Pan, CMA, am acting as scribe for Othman Masur K, [name].   Documentation: I have reviewed the above documentation for accuracy and completeness, and I agree with the above.  Deral Schellenberg K, [name]

## 2024-03-18 NOTE — Patient Instructions (Signed)

## 2024-03-24 ENCOUNTER — Ambulatory Visit: Payer: Self-pay | Admitting: Family

## 2024-03-27 ENCOUNTER — Ambulatory Visit (INDEPENDENT_AMBULATORY_CARE_PROVIDER_SITE_OTHER): Payer: MEDICAID | Admitting: Primary Care

## 2024-03-28 ENCOUNTER — Other Ambulatory Visit: Payer: Self-pay | Admitting: Pharmacist

## 2024-03-28 NOTE — Progress Notes (Signed)
 Specialty Pharmacy Ongoing Clinical Assessment Note  [name] is a 43 y.o. male who is being followed by the specialty pharmacy service for RxSp Hepatitis C   Patient's specialty medication(s) reviewed today: Sofosbuvir -Velpatasvir    Missed doses in the last 4 weeks: 0   Patient/Caregiver did not have any additional questions or concerns.   Therapeutic benefit summary: Patient is achieving benefit   Adverse events/side effects summary: No adverse events/side effects   Patient's therapy is appropriate to: Discontinue    Goals Addressed             This Visit's Progress    COMPLETED: Achieve virologic cure as evidenced by SVR       Patient is initiating therapy. Patient will be evaluated at upcoming provider appointment to assess progress         Follow up: none required -therapy completed  [name] Specialty Pharmacist

## 2024-04-11 ENCOUNTER — Other Ambulatory Visit: Payer: Self-pay

## 2024-04-11 ENCOUNTER — Emergency Department (HOSPITAL_COMMUNITY)
Admission: EM | Admit: 2024-04-11 | Discharge: 2024-04-11 | Disposition: A | Discharge status: Home / Self Care | Payer: MEDICAID | Attending: Emergency Medicine | Admitting: Emergency Medicine

## 2024-04-11 ENCOUNTER — Emergency Department (HOSPITAL_COMMUNITY): Payer: MEDICAID

## 2024-04-11 DIAGNOSIS — F1123 Opioid dependence with withdrawal: Secondary | ICD-10-CM | POA: Diagnosis not present

## 2024-04-11 DIAGNOSIS — E860 Dehydration: Secondary | ICD-10-CM | POA: Insufficient documentation

## 2024-04-11 DIAGNOSIS — D751 Secondary polycythemia: Secondary | ICD-10-CM | POA: Diagnosis not present

## 2024-04-11 DIAGNOSIS — F1193 Opioid use, unspecified with withdrawal: Secondary | ICD-10-CM

## 2024-04-11 DIAGNOSIS — Z7984 Long term (current) use of oral hypoglycemic drugs: Secondary | ICD-10-CM | POA: Diagnosis not present

## 2024-04-11 DIAGNOSIS — E876 Hypokalemia: Secondary | ICD-10-CM | POA: Insufficient documentation

## 2024-04-11 DIAGNOSIS — D72829 Elevated white blood cell count, unspecified: Secondary | ICD-10-CM | POA: Diagnosis not present

## 2024-04-11 DIAGNOSIS — E878 Other disorders of electrolyte and fluid balance, not elsewhere classified: Secondary | ICD-10-CM | POA: Diagnosis not present

## 2024-04-11 DIAGNOSIS — R112 Nausea with vomiting, unspecified: Secondary | ICD-10-CM | POA: Diagnosis present

## 2024-04-11 DIAGNOSIS — N179 Acute kidney failure, unspecified: Secondary | ICD-10-CM | POA: Diagnosis not present

## 2024-04-11 LAB — COMPREHENSIVE METABOLIC PANEL WITH GFR
ALT: 15 U/L (ref 0–44)
AST: 33 U/L (ref 15–41)
Albumin: 4.8 g/dL (ref 3.5–5.0)
Alkaline Phosphatase: 119 U/L (ref 38–126)
Anion gap: 32 — ABNORMAL HIGH (ref 5–15)
BUN: 26 mg/dL — ABNORMAL HIGH (ref 6–20)
CO2: 23 mmol/L (ref 22–32)
Calcium: 11.2 mg/dL — ABNORMAL HIGH (ref 8.9–10.3)
Chloride: 85 mmol/L — ABNORMAL LOW (ref 98–111)
Creatinine, Ser: 1.36 mg/dL — ABNORMAL HIGH (ref 0.61–1.24)
GFR, Estimated: >60 mL/min (ref >60)
Glucose, Bld: 191 mg/dL — ABNORMAL HIGH (ref 70–99)
Potassium: 3 mmol/L — ABNORMAL LOW (ref 3.5–5.1)
Sodium: 140 mmol/L (ref 135–145)
Total Bilirubin: 0.8 mg/dL (ref 0.0–1.2)
Total Protein: 10.1 g/dL — ABNORMAL HIGH (ref 6.5–8.1)

## 2024-04-11 LAB — CBC
HCT: 50.8 % (ref 39.0–52.0)
Hemoglobin: 17.5 g/dL — ABNORMAL HIGH (ref 13.0–17.0)
MCH: 27.5 pg (ref 26.0–34.0)
MCHC: 34.4 g/dL (ref 30.0–36.0)
MCV: 79.9 fL — ABNORMAL LOW (ref 80.0–100.0)
Platelets: 391 K/uL (ref 150–400)
RBC: 6.36 MIL/uL — ABNORMAL HIGH (ref 4.22–5.81)
RDW: 13.5 % (ref 11.5–15.5)
WBC: 20 K/uL — ABNORMAL HIGH (ref 4.0–10.5)
nRBC: 0 % (ref 0.0–0.2)

## 2024-04-11 LAB — ETHANOL: Alcohol, Ethyl (B): <15 mg/dL (ref <15)

## 2024-04-11 LAB — LIPASE, BLOOD: Lipase: 16 U/L (ref 11–51)

## 2024-04-11 LAB — I-STAT CG4 LACTIC ACID, ED: Lactic Acid, Venous: 1.6 mmol/L (ref 0.5–1.9)

## 2024-04-11 MED ORDER — ONDANSETRON 4 MG PO TBDP: 4.0000 mg | ORAL_TABLET | Freq: Four times a day (QID) | ORAL | 0 refills | Status: AC | PRN

## 2024-04-11 MED ORDER — METHADONE HCL 5 MG PO TABS: 36.0000 mg | ORAL_TABLET | Freq: Once | ORAL | Status: DC

## 2024-04-11 MED ORDER — POTASSIUM CHLORIDE CRYS ER 20 MEQ PO TBCR
40.0000 meq | EXTENDED_RELEASE_TABLET | Freq: Once | ORAL | Status: AC
  Administered 2024-04-11: 40 meq via ORAL
  Filled 2024-04-11: qty 2

## 2024-04-11 MED ORDER — ONDANSETRON HCL 4 MG/2ML IJ SOLN
4.0000 mg | Freq: Once | INTRAMUSCULAR | Status: AC
  Administered 2024-04-11: 4 mg via INTRAVENOUS
  Filled 2024-04-11: qty 2

## 2024-04-11 MED ORDER — ONDANSETRON HCL 4 MG/2ML IJ SOLN
4.0000 mg | Freq: Once | INTRAMUSCULAR | Status: AC
  Administered 2024-04-11: 4 mg via INTRAVENOUS

## 2024-04-11 MED ORDER — SODIUM CHLORIDE 0.9 % IV BOLUS
1000.0000 mL | Freq: Once | INTRAVENOUS | Status: AC
  Administered 2024-04-11: 1000 mL via INTRAVENOUS

## 2024-04-11 MED ORDER — LORAZEPAM 2 MG/ML IJ SOLN
1.0000 mg | Freq: Once | INTRAMUSCULAR | Status: AC
  Administered 2024-04-11: 1 mg via INTRAVENOUS
  Filled 2024-04-11: qty 1

## 2024-04-11 MED ORDER — LORAZEPAM 1 MG PO TABS: 2.0000 mg | ORAL_TABLET | Freq: Once | ORAL | Status: DC

## 2024-04-11 MED ORDER — METHADONE HCL 10 MG/ML PO CONC
36.0000 mg | Freq: Once | ORAL | Status: AC
  Administered 2024-04-11: 36 mg via ORAL
  Filled 2024-04-11: qty 5

## 2024-04-11 MED ORDER — POLYETHYLENE GLYCOL 3350 17 G PO PACK: 17.0000 g | PACK | Freq: Every day | ORAL | 2 refills | Status: AC

## 2024-04-11 MED ORDER — IOHEXOL 300 MG/ML  SOLN
100.0000 mL | Freq: Once | INTRAMUSCULAR | Status: AC | PRN
  Administered 2024-04-11: 100 mL via INTRAVENOUS

## 2024-04-11 NOTE — Discharge Instructions (Addendum)
 Please drink plenty of fluids including electrolyte containing fluids, such as pedialyte, gatorade to help with dehydration.  You can take the laxatives that are prescribed to help with moving her bowels, as well as the nausea medication up to every 6 hours as needed.

## 2024-04-11 NOTE — ED Provider Notes (Signed)
 [place] [facility] Provider Note   CSN: [id number] Arrival date & time: 04/11/24  0445     Patient presents with: Withdrawal   [name] is a 43 y.o. male with past medical history significant for polysubstance abuse, substance-induced psychotic disorder, psoriasis, history of sepsis, bacteremia, encephalopathy who presents with concern for nausea, vomiting, withdrawal, restlessness.  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   HPI     Prior to Admission medications   Medication Sig Start Date End Date Taking? Authorizing Provider  methadone  (DOLOPHINE ) 1 mg/ml oral solution Take 36 mg by mouth daily.   Yes [provider]  ondansetron  (ZOFRAN -ODT) 4 MG disintegrating tablet Take 1 tablet (4 mg total) by mouth every 6 (six) hours as needed for nausea or vomiting. 04/11/24  Yes Sunni Richardson H, [name]  polyethylene glycol (MIRALAX) 17 g packet Take 17 g by mouth daily. 04/11/24  Yes Quinzell Malcomb H, [name]  calcipotriene (DOVONOX) 0.005 % cream Apply topically 2 (two) times daily. 03/18/24   Sandridge, Brenda K, [name]  Cholecalciferol  (VITAMIN D3) 50 MCG (2000 UT) capsule TAKE ONE CAPSULE EACH DAY Patient not taking: Reported on 02/15/2024 07/13/20   [provider]  clobetasol cream (TEMOVATE) 0.05 % Apply 1 Application topically 2 (two) times daily. APPLY TWICE DAILY AS NEEDED 03/18/24   Sandridge, Brenda K, [name]  glipiZIDE  (GLUCOTROL ) 10 MG tablet Take 1 tablet (10 mg total) by mouth 2 (two) times daily before a meal. Patient not taking: Reported on 01/10/2024 11/10/20 12/30/23  Celestia Rosaline SQUIBB, NP  hydrochlorothiazide (HYDRODIURIL) 25 MG tablet Take 25 mg by mouth daily. 09/25/23   [provider]  LIPITOR 20 MG tablet Take 20 mg by mouth at bedtime. 07/13/20   [provider]  lisinopril  (ZESTRIL ) 5 MG tablet Take 1 tablet (5 mg total) by  mouth daily. Patient not taking: Reported on 03/18/2024 11/10/20   Celestia Rosaline SQUIBB, NP  metFORMIN  (GLUCOPHAGE ) 1000 MG tablet Take 1,000 mg by mouth 2 (two) times daily with a meal.    [provider]  PROTONIX  20 MG tablet Take 20 mg by mouth daily. 07/13/20   [provider]  QUEtiapine  (SEROQUEL ) 300 MG tablet Take 300 mg by mouth at bedtime.    [provider]  triamcinolone  (KENALOG ) 0.1 %  07/08/20   [provider]  VENTOLIN HFA 108 (90 Base) MCG/ACT inhaler Inhale 2 puffs into the lungs every 4 (four) hours as needed. 11/19/23   [provider]  VISTARIL  50 MG capsule Take 50 mg by mouth 3 (three) times daily as needed. 07/13/20   [provider]    Allergies: Patient has no known allergies.    Review of Systems  All other systems reviewed and are negative.   Updated Vital Signs BP (!) 142/86   Pulse (!) 108   Temp 99.1 F (37.3 C) (Oral)   Resp 18   Ht 5' 11 (1.803 m)   Wt 93.4 kg   SpO2 94%   BMI 28.73 kg/m   Physical Exam Vitals and nursing note reviewed.  Constitutional:      General: He is not in acute distress.    Appearance: Normal appearance.  HENT:     Head: Normocephalic and atraumatic.  Eyes:     General:        Right eye: No discharge.  Left eye: No discharge.  Cardiovascular:     Rate and Rhythm: Regular rhythm. Tachycardia present.     Pulses: Normal pulses.     Heart sounds: No murmur heard.    No friction rub. No gallop.     Comments: DP, PT pulses 2+, radial, ulnar pulses 2+ bilateral upper lower extremities Pulmonary:     Effort: Pulmonary effort is normal.     Breath sounds: Normal breath sounds.  Abdominal:     General: Bowel sounds are normal.     Palpations: Abdomen is soft.  Skin:    General: Skin is warm and dry.     Capillary Refill: Capillary refill takes less than 2 seconds.     Comments: Diffuse psoriasis lesions throughout upper and lower extremities, no evidence of  cellulitis or abscess noted on my exam  Neurological:     Mental Status: He is alert and oriented to person, place, and time.     Comments: Endorses some pins and needle sensation on upper and lower distal extremities, hands and feet  Psychiatric:        Mood and Affect: Mood normal.        Behavior: Behavior normal.     (all labs ordered are listed, but only abnormal results are displayed) Labs Reviewed  CBC - Abnormal; Notable for the following components:      Result Value   WBC 20.0 (*)    RBC 6.36 (*)    Hemoglobin 17.5 (*)    MCV 79.9 (*)    All other components within normal limits  COMPREHENSIVE METABOLIC PANEL WITH GFR - Abnormal; Notable for the following components:   Potassium 3.0 (*)    Chloride 85 (*)    Glucose, Bld 191 (*)    BUN 26 (*)    Creatinine, Ser 1.36 (*)    Calcium  11.2 (*)    Total Protein 10.1 (*)    Anion gap 32 (*)    All other components within normal limits  CULTURE, BLOOD (ROUTINE X 2)  CULTURE, BLOOD (ROUTINE X 2)  LIPASE, BLOOD  ETHANOL  I-STAT CG4 LACTIC ACID, ED    EKG: EKG Interpretation Date/Time:  Friday April 11 2024 07:47:52 EST Ventricular Rate:  75 PR Interval:  152 QRS Duration:  111 QT Interval:  425 QTC Calculation: 475 R Axis:   96  Text Interpretation: Sinus rhythm Supraventricular bigeminy Left posterior fascicular block ST-t wave abnormality Artifact Abnormal ECG Confirmed by Garrick Charleston [phone]) on 04/11/2024 7:55:35 AM  Radiology: CT CHEST ABDOMEN PELVIS W CONTRAST Result Date: 04/11/2024 EXAM: CT CHEST, ABDOMEN AND PELVIS WITH CONTRAST 04/11/2024 09:25:43 AM TECHNIQUE: CT of the chest, abdomen and pelvis was performed with the administration of 100 mL of iohexol  (OMNIPAQUE ) 300 MG/ML solution. Multiplanar reformatted images are provided for review. Automated exposure control, iterative reconstruction, and/or weight based adjustment of the mA/kV was utilized to reduce the radiation dose to as low as reasonably  achievable. COMPARISON: 12/30/2023 CLINICAL HISTORY: Sepsis. FINDINGS: CHEST: MEDIASTINUM AND LYMPH NODES: Heart and pericardium are unremarkable. The central airways are clear. No mediastinal, hilar or axillary lymphadenopathy. LUNGS AND PLEURA: Mild centrilobular emphysema. Posterior bibasilar dependent atelectasis. No pleural effusion or pneumothorax. ABDOMEN AND PELVIS: LIVER: Subcentimeter hypodensity in the lateral right hepatic lobe, too small to definitively characterize, but likely a small cyst or biliary hamartoma. GALLBLADDER AND BILE DUCTS: Fluid-filled, distended gallbladder, without wall thickening or radiopaque stones. No biliary ductal dilatation. SPLEEN: No acute abnormality. PANCREAS: No acute abnormality.  ADRENAL GLANDS: No acute abnormality. KIDNEYS, URETERS AND BLADDER: There is a 2 cm left lower pole renal cyst. Subcentimeter hypodensity in the upper pole of the right kidney, too small to definitively characterize, but also likely a small cyst. No stones in the kidneys or ureters. No hydronephrosis. No perinephric or periureteral stranding. Urinary bladder is unremarkable. GI AND BOWEL: Stomach demonstrates no acute abnormality. Normal appendix. Subcutaneous fat deposition in the cecum and ascending colon, possibly due to obesity or chronic inflammation. There is no bowel obstruction. REPRODUCTIVE ORGANS: No prostatomegaly or free pelvic fluid. PERITONEUM AND RETROPERITONEUM: No ascites. No free air. . No mesenteric inflammation. VASCULATURE: Aorta is normal in caliber. Aortoiliac atherosclerosis, more so than expected for patient's given age. ABDOMINAL AND PELVIS LYMPH NODES: No lymphadenopathy. BONES AND SOFT TISSUES: Scattered thoracolumbar osteophytosis. No focal soft tissue abnormality. IMPRESSION: 1. No acute abnormality within the chest, abdomen, or pelvis. 2. Mild centrilobular emphysema. No pneumonia, pulmonary edema, or pleural effusion. 3. Aortoiliac atherosclerosis, greater than  expected for age. Appropriate risk stratification recommended. Electronically signed by: Rogelia Myers MD 04/11/2024 10:09 AM EST RP Workstation: [id number]     Procedures   Medications Ordered in the ED  ondansetron  (ZOFRAN ) injection 4 mg (has no administration in time range)  sodium chloride  0.9 % bolus 1,000 mL (0 mLs Intravenous Stopped 04/11/24 1005)  ondansetron  (ZOFRAN ) injection 4 mg (4 mg Intravenous Given 04/11/24 0655)  methadone  (DOLOPHINE ) 10 MG/ML solution 36 mg (36 mg Oral Given 04/11/24 0656)  potassium chloride  SA (KLOR-CON  M) CR tablet 40 mEq (40 mEq Oral Given 04/11/24 1002)  sodium chloride  0.9 % bolus 1,000 mL (0 mLs Intravenous Stopped 04/11/24 1029)  LORazepam  (ATIVAN ) injection 1 mg (1 mg Intravenous Given 04/11/24 0901)  iohexol  (OMNIPAQUE ) 300 MG/ML solution 100 mL (100 mLs Intravenous Contrast Given 04/11/24 0917)                                    Medical Decision Making Amount and/or Complexity of Data Reviewed Labs: ordered.  Risk Prescription drug management.   This patient is a 43 y.o. male  who presents to the ED for concern of nausea, vomiting, concern for dehydration, general malaise.   Differential diagnoses prior to evaluation: The emergent differential diagnosis includes, but is not limited to, opioid withdrawal, gastroenteritis, sepsis, abdominal infection, pneumonia, cellulitis,. This is not an exhaustive differential.   Past Medical History / Co-morbidities / Social History: polysubstance abuse, substance-induced psychotic disorder, psoriasis, history of sepsis, bacteremia, encephalopathy  Additional history: Chart reviewed. Pertinent results include: Reviewed lab work, imaging from previous emergency department as it is  Physical Exam: Physical exam performed. The pertinent findings include: Scattered excoriations with no evidence of cellulitis, abscess on arms, legs.  Normal DP, PT pulses, radial, ulnar pulses of bilateral upper and lower  extremities.  He is initially tachycardic, he is dry mucous membranes.  He is initially mildly tachypneic.  Vital signs improved on recheck, heart rate around 89, stable oxygen saturation on room air.  He has some mild tenderness to palpation epigastric region, occasional wet sounding cough, no wheezing, stridor, rales.  Lab Tests/Imaging studies: I personally interpreted labs/imaging and the pertinent results include: CBC notable for significant leukocytosis, blood cells 20, hemoglobin also with some polycythemia, 17.5, suspect hemoconcentration, although some source of infection additionally suspected, none is obviously identified after workup.  CMP notable for hypokalemia, testing 3.0, hypochloremia, chloride 85.  Potassium  repleted.  Patient does have an AKI, creatinine 1.36, BUN 26 from baseline around 0.8, consistent with dehydration.  Fluids were administered, he received 2 L of normal saline.  Normal lipase, normal lactic acid, negative ethanol level.  His large anion gap of 32 may be secondary to hypochloremia, and his opioid withdrawal..  CT chest abdomen pelvis with contrast with no evidence of acute intra-abdominal or intrathoracic infection, he has some chronic emphysematous changes noted.  I agree with the radiologist interpretation.  Cardiac monitoring: EKG obtained and interpreted by myself and attending physician which shows: Normal sinus rhythm, nonspecific ST-T abnormality, patient without any chest pain or exertional symptoms.   Medications: I ordered medication including administered his home methadone , Ativan , Zofran  for nausea, potassium for hypokalemia, fluids for his suspected dehydration given his AKI, hemoconcentration.  I have reviewed the patients home medicines and have made adjustments as needed.   Disposition: After consideration of the diagnostic results and the patients response to treatment, I feel that patient reassessed, he is feeling significantly improved, given his  lab work considered possible admission for opioid withdrawal, AKI, large anion gap, however patient reports that he is feeling better and requesting discharge, I think this is reasonable as during admission we would mostly be providing supportive care, return precautions given, patient discharged in stable condition.  Tolerating p.o. at time of discharge.   emergency department workup does not suggest an emergent condition requiring admission or immediate intervention beyond what has been performed at this time. The plan is: as above. The patient is safe for discharge and has been instructed to return immediately for worsening symptoms, change in symptoms or any other concerns.    Final diagnoses:  Narcotic withdrawal (HCC)  Dehydration  Hypokalemia  AKI (acute kidney injury)    ED Discharge Orders          Ordered    polyethylene glycol (MIRALAX) 17 g packet  Daily        04/11/24 1026    ondansetron  (ZOFRAN -ODT) 4 MG disintegrating tablet  Every 6 hours PRN        04/11/24 1026               Mionna Advincula, [place] H, [name] 04/11/24 1037    Garrick Charleston, MD 04/11/24 1623

## 2024-04-11 NOTE — ED Notes (Signed)
Full linen change 

## 2024-04-11 NOTE — ED Triage Notes (Signed)
 Patient arrived stating he is on 36 mg of Methadone  and has not had any since yesterday. States he has been vomiting, agitated, and restless. Also reports his hands and feet are numb but he started a new job laying sod and he feels like its related.

## 2024-04-16 LAB — CULTURE, BLOOD (ROUTINE X 2)
Culture: NO GROWTH
Special Requests: ADEQUATE

## 2024-04-29 ENCOUNTER — Ambulatory Visit: Payer: MEDICAID | Admitting: Podiatry

## 2024-05-22 ENCOUNTER — Encounter: Payer: Self-pay | Admitting: Podiatry

## 2024-05-22 ENCOUNTER — Ambulatory Visit: Payer: MEDICAID | Admitting: Podiatry

## 2024-05-22 DIAGNOSIS — M216X2 Other acquired deformities of left foot: Secondary | ICD-10-CM | POA: Diagnosis not present

## 2024-05-22 DIAGNOSIS — M216X1 Other acquired deformities of right foot: Secondary | ICD-10-CM | POA: Diagnosis not present

## 2024-05-22 MED ORDER — AMMONIUM LACTATE 12 % EX CREA: 1.0000 | TOPICAL_CREAM | CUTANEOUS | 0 refills | Status: AC | PRN

## 2024-05-22 NOTE — Progress Notes (Unsigned)
 On feet all day

## 2024-06-24 ENCOUNTER — Ambulatory Visit: Payer: MEDICAID | Admitting: Physician Assistant
